# Patient Record
Sex: Female | Born: 1955 | Hispanic: Yes | Smoking: Former smoker
Health system: Southern US, Community
[De-identification: ages and names within clinical notes are randomized; demographics above are authoritative.]

## PROBLEM LIST (undated history)

## (undated) DIAGNOSIS — K8020 Calculus of gallbladder without cholecystitis without obstruction: Secondary | ICD-10-CM

## (undated) DIAGNOSIS — Z803 Family history of malignant neoplasm of breast: Secondary | ICD-10-CM

## (undated) DIAGNOSIS — N390 Urinary tract infection, site not specified: Secondary | ICD-10-CM

## (undated) DIAGNOSIS — M1990 Unspecified osteoarthritis, unspecified site: Secondary | ICD-10-CM

## (undated) HISTORY — DX: Urinary tract infection, site not specified: N39.0

## (undated) HISTORY — DX: Calculus of gallbladder without cholecystitis without obstruction: K80.20

## (undated) HISTORY — DX: Family history of malignant neoplasm of breast: Z80.3

---

## 2004-10-16 ENCOUNTER — Ambulatory Visit: Payer: Self-pay | Admitting: Unknown Physician Specialty

## 2005-09-04 ENCOUNTER — Ambulatory Visit: Payer: Self-pay | Admitting: Unknown Physician Specialty

## 2005-12-03 ENCOUNTER — Ambulatory Visit: Payer: Self-pay | Admitting: Unknown Physician Specialty

## 2006-12-09 ENCOUNTER — Ambulatory Visit: Payer: Self-pay | Admitting: Unknown Physician Specialty

## 2007-07-18 ENCOUNTER — Ambulatory Visit: Payer: Self-pay | Admitting: Specialist

## 2007-09-12 ENCOUNTER — Ambulatory Visit: Payer: Self-pay | Admitting: Unknown Physician Specialty

## 2007-09-19 ENCOUNTER — Ambulatory Visit: Payer: Self-pay | Admitting: Unknown Physician Specialty

## 2007-12-16 ENCOUNTER — Ambulatory Visit: Payer: Self-pay | Admitting: Unknown Physician Specialty

## 2008-03-16 ENCOUNTER — Ambulatory Visit: Payer: Self-pay | Admitting: Unknown Physician Specialty

## 2008-10-17 ENCOUNTER — Encounter: Payer: Self-pay | Admitting: General Practice

## 2008-11-16 ENCOUNTER — Encounter: Payer: Self-pay | Admitting: General Practice

## 2009-01-04 ENCOUNTER — Ambulatory Visit: Payer: Self-pay | Admitting: Unknown Physician Specialty

## 2009-03-08 ENCOUNTER — Ambulatory Visit: Payer: Self-pay | Admitting: Unknown Physician Specialty

## 2009-12-16 ENCOUNTER — Other Ambulatory Visit: Payer: Self-pay | Admitting: Unknown Physician Specialty

## 2009-12-18 ENCOUNTER — Encounter: Payer: Self-pay | Admitting: Unknown Physician Specialty

## 2010-01-02 ENCOUNTER — Ambulatory Visit: Payer: Self-pay | Admitting: Unknown Physician Specialty

## 2010-01-06 ENCOUNTER — Ambulatory Visit: Payer: Self-pay | Admitting: Unknown Physician Specialty

## 2010-01-16 ENCOUNTER — Encounter: Payer: Self-pay | Admitting: Unknown Physician Specialty

## 2010-01-20 ENCOUNTER — Ambulatory Visit: Payer: Self-pay | Admitting: Gastroenterology

## 2010-06-03 ENCOUNTER — Other Ambulatory Visit: Payer: Self-pay | Admitting: Physician Assistant

## 2010-10-06 ENCOUNTER — Emergency Department: Payer: Self-pay | Admitting: *Deleted

## 2011-01-13 ENCOUNTER — Ambulatory Visit: Payer: Self-pay | Admitting: Unknown Physician Specialty

## 2011-02-11 ENCOUNTER — Encounter: Payer: Self-pay | Admitting: Unknown Physician Specialty

## 2012-01-19 ENCOUNTER — Ambulatory Visit: Payer: Self-pay | Admitting: Obstetrics and Gynecology

## 2012-02-12 LAB — HM PAP SMEAR: HM Pap smear: NORMAL

## 2012-02-12 LAB — HM MAMMOGRAPHY: HM Mammogram: NORMAL

## 2012-04-06 ENCOUNTER — Ambulatory Visit: Payer: Self-pay | Admitting: Internal Medicine

## 2012-05-12 ENCOUNTER — Ambulatory Visit (INDEPENDENT_AMBULATORY_CARE_PROVIDER_SITE_OTHER): Payer: 59 | Admitting: Internal Medicine

## 2012-05-12 ENCOUNTER — Encounter: Payer: Self-pay | Admitting: Internal Medicine

## 2012-05-12 VITALS — BP 118/68 | HR 78 | Temp 98.6°F | Resp 15 | Ht 65.0 in | Wt 126.8 lb

## 2012-05-12 DIAGNOSIS — Z780 Asymptomatic menopausal state: Secondary | ICD-10-CM

## 2012-05-12 DIAGNOSIS — F5105 Insomnia due to other mental disorder: Secondary | ICD-10-CM

## 2012-05-12 DIAGNOSIS — F419 Anxiety disorder, unspecified: Secondary | ICD-10-CM

## 2012-05-12 DIAGNOSIS — F411 Generalized anxiety disorder: Secondary | ICD-10-CM

## 2012-05-12 DIAGNOSIS — K5900 Constipation, unspecified: Secondary | ICD-10-CM

## 2012-05-12 DIAGNOSIS — Z862 Personal history of diseases of the blood and blood-forming organs and certain disorders involving the immune mechanism: Secondary | ICD-10-CM

## 2012-05-12 DIAGNOSIS — Z1211 Encounter for screening for malignant neoplasm of colon: Secondary | ICD-10-CM

## 2012-05-12 DIAGNOSIS — Z8639 Personal history of other endocrine, nutritional and metabolic disease: Secondary | ICD-10-CM

## 2012-05-12 DIAGNOSIS — F489 Nonpsychotic mental disorder, unspecified: Secondary | ICD-10-CM

## 2012-05-12 MED ORDER — FLORA-Q 2 PO CAPS: 1.0000 | ORAL_CAPSULE | Freq: Two times a day (BID) | ORAL | Status: DC

## 2012-05-12 MED ORDER — CULTURELLE DIGESTIVE HEALTH PO CAPS: 1.0000 | ORAL_CAPSULE | Freq: Two times a day (BID) | ORAL | Status: DC

## 2012-05-12 NOTE — Progress Notes (Signed)
Patient ID: Heather Watson, female   DOB: 21-Feb-1955, 57 y.o.   MRN: [id number]  Patient Active Problem List  Diagnosis  . Menopause  . Unspecified constipation  . Insomnia secondary to anxiety    Subjective:  CC:   Chief Complaint  Patient presents with  . Establish Care    HPI: Cc: gas,  Constipation  despite use of chewable fiber, and increased intake of  water .  She also takes flaxseed daily. Her symptoms are daily. She has hemorrhoids occasionally bleed. Her last colonoscopy was 2 years ago and was reportedly normal. She has tried probiotics and use of lactase prior to meals containing dairy with no significant change.  No diarrhea. No unintentional weight loss.,    She is under considerable emotional stress due to    the recent diagnosis of her mother with invasive ductal carcinoma of the breast. Her mother lives in [place] and has been having neurologic symptoms whatsoever thus far  has not been able to get an MRI of the brain.   Marital discord with her husband of 30 years. For the past 5 years he has been less participatory in recreational activities with her. He prefers to do things on his own. She does  not think he is having an affair.,    Vaginal problems,  using a Pessary .  Trial of estrogen cream, abolene, developed yeast infection,  Sent to specialist at  [facility]  was given a complicated pessary and vaseline routine Dr Noelle Penner at [facility] .  Deferred SSRI for hot flashes  History of thyroid cyst  Last imaged and evaluated in 2012  With no progrssion noted. She has no history of prior biopsy.  tsh was normal last year.    History of gallstones, 3 prior attacks,  Ultrasound confirmed presence of nonobstructive gallstones. She has continually opted against surgery and using the presence of gallstones to enforce compliance with a low fat diet.  She's had no symptoms since she has changed her diet.    Heather Watson is a 56 y.o. female who presents as a new  patient to establish primary care with the chief complaint of   Past Medical History  Diagnosis Date  . UTI (urinary tract infection)   . Gallstones     History reviewed. No pertinent past surgical history.  Family History  Problem Relation Age of Onset  . Hypertension Mother   . Arthritis Mother     osteoarthritis  . Cancer Mother     breast  . Stroke Father   . Hypertension Father   . Cancer Sister     lymphoma  . Hypertension Sister   . Hypertension Brother   . Heart attack Maternal Grandfather   . Stroke Paternal Grandfather   . Hypertension Brother     History   Social History  . Marital Status: Married    Spouse Name: N/A    Number of Children: N/A  . Years of Education: N/A   Occupational History  . Not on file.   Social History Main Topics  . Smoking status: Former Smoker    Types: Cigarettes  . Smokeless tobacco: Not on file  . Alcohol Use: Yes  . Drug Use: No  . Sexually Active: Not on file   Other Topics Concern  . Not on file   Social History Narrative  . No narrative on file       @ALLHX @    Review of Systems:   The remainder of  the review of systems was negative except those addressed in the HPI.       Objective:  BP 118/68  Pulse 78  Temp(Src) 98.6 F (37 C)  Resp 15  Ht 5\' 5"  (1.651 m)  Wt 126 lb 12 oz (57.493 kg)  BMI 21.09 kg/m2  SpO2 98%  LMP 04/12/2006  General appearance: alert, cooperative and appears stated age Ears: normal TM's and external ear canals both ears Throat: lips, mucosa, and tongue normal; teeth and gums normal Neck: no adenopathy, no carotid bruit, supple, symmetrical, trachea midline and thyroid not enlarged, symmetric, no tenderness/mass/nodules Back: symmetric, no curvature. ROM normal. No CVA tenderness. Lungs: clear to auscultation bilaterally Heart: regular rate and rhythm, S1, S2 normal, no murmur, click, rub or gallop Abdomen: soft, non-tender; bowel sounds normal; no masses,  no  organomegaly Pulses: 2+ and symmetric Skin: Skin color, texture, turgor normal. No rashes or lesions Lymph nodes: Cervical, supraclavicular, and axillary nodes normal.  Assessment and Plan:  Menopause She was given a prescription for Lexapro and Effexor by her gynecologist but has not started either. We discussed the pros and cons of trying an SSRI for management of hot flashes. I've advised her to try for either one for several months given her concurrent emotional stress manifesting as anxiety.   Unspecified constipation The patient describes her problem was constipation but she actually moves her bowels daily. She's describing more abdominal pain and bloating which sounds to me like irritable bowel syndrome, with a very healthy diet of fiber and water and probiotics. Recommended trial of Clozaril is symptomatic, Gas-X for management of bloating and gas.  Insomnia secondary to anxiety She has been  taking melatonin for only a few weeks. Advised increase dose to 6 mg and take 3 hours before bedtime  H/O thyroid cyst Found incidentally during CT of the neck done for painful swallowing by prior PCP PCP in 2011. 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 Thyroid function has been reportedly normal.   Updated Medication List Outpatient Encounter Prescriptions as of 05/12/2012  Medication Sig Dispense Refill  . Bioflavonoid Products (ESTER C PO) 1000mg  daily      . Biotin 2500 MCG CAPS Take 1 capsule by mouth daily.      . Cholecalciferol (VITAMIN D) 2000 UNITS tablet Take 2,000 Units by mouth daily.      . COD LIVER OIL W/VIT A & D PO Take 1 capsule by mouth daily.      . COLLAGEN PO Pt takes 3000mg  daily      . Estradiol-Norethindrone Acet (ACTIVELLA) 0.5-0.1 MG per tablet Take 1 tablet by mouth daily.      . Lactobacillus-Inulin (CULTURELLE DIGESTIVE HEALTH) CAPS Take 1 capsule by mouth 2 (two) times daily.  60 capsule  0  . Melatonin 3 MG CAPS Take 1 capsule by mouth daily.      .  Methylsulfonylmethane (MSM) 1000 MG CAPS Take 1 capsule by mouth daily.      . Omega 3-6-9 Fatty Acids LIQD 2.5 teaspoons daily.      . Probiotic Product (FLORA-Q 2) CAPS Take 1 capsule by mouth 2 (two) times daily.  60 capsule  1  . Resveratrol 250 MG CAPS Take 1 capsule by mouth daily.      . vitamin B-12 (CYANOCOBALAMIN) 1000 MCG tablet Take 1,000 mcg by mouth daily.       No facility-administered encounter medications on file as of 05/12/2012.

## 2012-05-12 NOTE — Patient Instructions (Addendum)
Try Culturelle, followed by floraque (each for one month) to see if the bloating is resolved  Also try Gas X (simethicone) for the post prandial gas   Vaginismus. Com (website for vaginal stenosis treatment modalities)    Take the melatonin  6 mg  After dinner  Try a glass of wine with winner

## 2012-05-14 ENCOUNTER — Encounter: Payer: Self-pay | Admitting: Internal Medicine

## 2012-05-14 DIAGNOSIS — Z8639 Personal history of other endocrine, nutritional and metabolic disease: Secondary | ICD-10-CM | POA: Insufficient documentation

## 2012-05-14 DIAGNOSIS — F419 Anxiety disorder, unspecified: Secondary | ICD-10-CM | POA: Insufficient documentation

## 2012-05-14 DIAGNOSIS — K5900 Constipation, unspecified: Secondary | ICD-10-CM | POA: Insufficient documentation

## 2012-05-14 DIAGNOSIS — Z780 Asymptomatic menopausal state: Secondary | ICD-10-CM | POA: Insufficient documentation

## 2012-05-14 DIAGNOSIS — F5105 Insomnia due to other mental disorder: Secondary | ICD-10-CM | POA: Insufficient documentation

## 2012-05-14 NOTE — Assessment & Plan Note (Addendum)
The patient describes her problem was constipation but she actually moves her bowels daily. She's describing more abdominal pain and bloating which sounds to me like irritable bowel syndrome, with a very healthy diet of fiber and water and probiotics. Recommended trial of Clozaril is symptomatic, Gas-X for management of bloating and gas.

## 2012-05-14 NOTE — Assessment & Plan Note (Signed)
She was given a prescription for Lexapro and Effexor by her gynecologist but has not started either. We discussed the pros and cons of trying an SSRI for management of hot flashes. I've advised her to try for either one for several months given her concurrent emotional stress manifesting as anxiety.

## 2012-05-14 NOTE — Assessment & Plan Note (Signed)
Found incidentally during CT of the neck done for painful swallowing by prior PCP PCP in 2011. 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 Thyroid function has been reportedly normal.

## 2012-05-14 NOTE — Assessment & Plan Note (Signed)
She has been  taking melatonin for only a few weeks. Advised increase dose to 6 mg and take 3 hours before bedtime

## 2012-10-03 ENCOUNTER — Ambulatory Visit (INDEPENDENT_AMBULATORY_CARE_PROVIDER_SITE_OTHER): Payer: 59 | Admitting: Internal Medicine

## 2012-10-03 ENCOUNTER — Encounter: Payer: Self-pay | Admitting: Internal Medicine

## 2012-10-03 VITALS — BP 116/68 | HR 67 | Temp 98.5°F | Resp 14 | Wt 128.5 lb

## 2012-10-03 DIAGNOSIS — N390 Urinary tract infection, site not specified: Secondary | ICD-10-CM

## 2012-10-03 LAB — POCT URINALYSIS DIPSTICK
Bilirubin, UA: NEGATIVE
Glucose, UA: NEGATIVE
Ketones, UA: NEGATIVE
Nitrite, UA: NEGATIVE
Protein, UA: NEGATIVE
Spec Grav, UA: 1.015
Urobilinogen, UA: 0.2
pH, UA: 7

## 2012-10-03 MED ORDER — PHENAZOPYRIDINE HCL 200 MG PO TABS: 200.0000 mg | ORAL_TABLET | Freq: Three times a day (TID) | ORAL | Status: DC | PRN

## 2012-10-03 MED ORDER — CIPROFLOXACIN HCL 250 MG PO TABS: 250.0000 mg | ORAL_TABLET | Freq: Two times a day (BID) | ORAL | Status: DC

## 2012-10-03 MED ORDER — SULFAMETHOXAZOLE-TRIMETHOPRIM 800-160 MG PO TABS: 1.0000 | ORAL_TABLET | Freq: Two times a day (BID) | ORAL | Status: DC

## 2012-10-03 NOTE — Patient Instructions (Addendum)
Your urinalysis shows signs of infection.,  Please start the ciprofloxacin and take it for 5 days.  Keep the Septra rx as a back up.    We will contact you with the results of your culture , which may take 2 or 3 days .  It may require a change in antibiotic to septra DS

## 2012-10-03 NOTE — Progress Notes (Signed)
Patient ID: Heather Watson, female   DOB: [birth year], 57 y.o.   MRN: [id number]   Patient Active Problem List   Diagnosis Date Noted  . UTI (urinary tract infection) 10/04/2012  . Menopause 05/14/2012  . Unspecified constipation 05/14/2012  . 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 H/O thyroid cyst 05/14/2012    Subjective:  CC:   Chief Complaint  Patient presents with  . Acute Visit    chills in body and then gets a bad feeling in vagina     HPI:   Heather Watson a 57 y.o. female who presents Signs and symptoms consistent with prior UTI. 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 After wiping excessive oil off repeatedly she states that last Tuesday she developed vaginal pain and a cold feeling in her vagina which she has associated with urinary tract infections in the past. She took the  dose of Septra which she had on hand from a prior infection and her symptoms resolved. Symptoms recurred on Friday after having intercourse on Thursday so she took another dose.   she denies dysuria but this is never her symptom when she has a UTI. She is leaving for [place] in several days is concerned that she may have a partially treated infection.   Past Medical History  Diagnosis Date  . UTI (urinary tract infection)   . Gallstones     History reviewed. No pertinent past surgical history.   The following portions of the patient's history were reviewed and updated as appropriate: Allergies, current medications, and problem list.    Review of Systems:   12 Pt  review of systems was negative except those addressed in the HPI,     History   Social History  . Marital Status: Married    Spouse Name: N/A    Number of Children: N/A  . Years of Education: N/A   Occupational History  . Not on file.   Social History Main Topics  . Smoking status: Former Smoker   Types: Cigarettes  . Smokeless tobacco: Not on file  . Alcohol Use: Yes  . Drug Use: No  . Sexual Activity: Not on file   Other Topics Concern  . Not on file   Social History Narrative  . No narrative on file    Objective:  Filed Vitals:   10/03/12 1517  BP: 116/68  Pulse: 67  Temp: 98.5 F (36.9 C)  Resp: 14     General appearance: alert, cooperative and appears stated age Ears: normal TM's and external ear canals both ears Throat: lips, mucosa, and tongue normal; teeth and gums normal Neck: no adenopathy, no carotid bruit, supple, symmetrical, trachea midline and thyroid not enlarged, symmetric, no tenderness/mass/nodules Back: symmetric, no curvature. ROM normal. No CVA tenderness. Lungs: clear to auscultation bilaterally Heart: regular rate and rhythm, S1, S2 normal, no murmur, click, rub or gallop Abdomen: soft, non-tender; bowel sounds normal; no masses,  no organomegaly GYN: No urethral or vaginal irritation or discharge. Pulses: 2+ and symmetric Skin: Skin color, texture, turgor normal. No rashes or lesions Lymph nodes: Cervical, supraclavicular, and axillary nodes normal.  Assessment and Plan:  UTI (urinary tract infection) Her urinalysis does suggest that she has urinary tract infection. Since she has partially treated herself with Septra on 2 occasions we will avoid using this isn't. Therapy. I've given her prescription for ciprofloxacin to use for 5 days.  Have also given her a backup prescription for Septra given the chances that the culture will not be resulted until she is out of town.   Updated Medication List Outpatient Encounter Prescriptions as of 10/03/2012  Medication Sig Dispense Refill  . Bioflavonoid Products (ESTER C PO) 1000mg  daily      . Cholecalciferol (VITAMIN D) 2000 UNITS tablet Take 2,000 Units by mouth daily.      . COD LIVER OIL W/VIT A & D PO Take 1 capsule by mouth daily.      . Estradiol-Norethindrone Acet (ACTIVELLA) 0.5-0.1 MG  per tablet Take 1 tablet by mouth daily.      . Melatonin 3 MG CAPS Take 1 capsule by mouth daily.      . Omega 3-6-9 Fatty Acids LIQD 2.5 teaspoons daily.      Marland Kitchen Resveratrol 250 MG CAPS Take 1 capsule by mouth daily.      . ciprofloxacin (CIPRO) 250 MG tablet Take 1 tablet (250 mg total) by mouth 2 (two) times daily.  10 tablet  0  . phenazopyridine (PYRIDIUM) 200 MG tablet Take 1 tablet (200 mg total) by mouth 3 (three) times daily as needed for pain.  10 tablet  0  . sulfamethoxazole-trimethoprim (SEPTRA DS) 800-160 MG per tablet Take 1 tablet by mouth 2 (two) times daily.  14 tablet  0  . [DISCONTINUED] Biotin 2500 MCG CAPS Take 1 capsule by mouth daily.      . [DISCONTINUED] COLLAGEN PO Pt takes 3000mg  daily      . [DISCONTINUED] Lactobacillus-Inulin (CULTURELLE DIGESTIVE HEALTH) CAPS Take 1 capsule by mouth 2 (two) times daily.  60 capsule  0  . [DISCONTINUED] Methylsulfonylmethane (MSM) 1000 MG CAPS Take 1 capsule by mouth daily.      . [DISCONTINUED] Probiotic Product (FLORA-Q 2) CAPS Take 1 capsule by mouth 2 (two) times daily.  60 capsule  1  . [DISCONTINUED] vitamin B-12 (CYANOCOBALAMIN) 1000 MCG tablet Take 1,000 mcg by mouth daily.       No facility-administered encounter medications on file as of 10/03/2012.     Orders Placed This Encounter  Procedures  . Urine culture  . POCT urinalysis dipstick    No Follow-up on file.

## 2012-10-04 ENCOUNTER — Encounter: Payer: Self-pay | Admitting: Internal Medicine

## 2012-10-04 DIAGNOSIS — N390 Urinary tract infection, site not specified: Secondary | ICD-10-CM | POA: Insufficient documentation

## 2012-10-04 NOTE — Assessment & Plan Note (Signed)
Her urinalysis does suggest that she has urinary tract infection. Since she has partially treated herself with Septra on 2 occasions we will avoid using this isn't. Therapy. I've given her prescription for ciprofloxacin to use for 5 days. Have also given her a backup prescription for Septra given the chances that the culture will not be resulted until she is out of town.

## 2012-10-06 ENCOUNTER — Encounter: Payer: Self-pay | Admitting: Internal Medicine

## 2012-10-06 LAB — URINE CULTURE

## 2012-11-01 ENCOUNTER — Ambulatory Visit (INDEPENDENT_AMBULATORY_CARE_PROVIDER_SITE_OTHER): Payer: 59 | Admitting: Internal Medicine

## 2012-11-01 ENCOUNTER — Encounter: Payer: Self-pay | Admitting: Internal Medicine

## 2012-11-01 VITALS — BP 102/64 | HR 61 | Temp 98.7°F | Resp 14 | Wt 125.5 lb

## 2012-11-01 DIAGNOSIS — N390 Urinary tract infection, site not specified: Secondary | ICD-10-CM

## 2012-11-01 LAB — URINALYSIS, ROUTINE W REFLEX MICROSCOPIC
Bilirubin Urine: NEGATIVE
Nitrite: NEGATIVE
Total Protein, Urine: NEGATIVE
Urobilinogen, UA: 0.2 (ref 0.0–1.0)

## 2012-11-01 LAB — POCT URINALYSIS DIPSTICK
Glucose, UA: NEGATIVE
Ketones, UA: NEGATIVE
Protein, UA: NEGATIVE
Spec Grav, UA: 1.01
Urobilinogen, UA: 0.2

## 2012-11-01 MED ORDER — CIPROFLOXACIN HCL 250 MG PO TABS: 250.0000 mg | ORAL_TABLET | Freq: Two times a day (BID) | ORAL | Status: DC

## 2012-11-01 NOTE — Assessment & Plan Note (Signed)
Last UTI was E coli resistant to Septra. Sensitive to Cipro. Will repeat Cipro empirically for symptoms and abnormal UA.  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

## 2012-11-01 NOTE — Progress Notes (Signed)
Patient ID: Heather Watson, female   DOB: 1955/04/22, 57 y.o.   MRN: [id number]   Patient Active Problem List   Diagnosis Date Noted  . UTI (urinary tract infection) 10/04/2012  . Menopause 05/14/2012  . Unspecified constipation 05/14/2012  . 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 H/O thyroid cyst 05/14/2012    Subjective:  CC:   Chief Complaint  Patient presents with  . Follow-up  . Urinary Tract Infection    HPI:   Heather Watson a 57 y.o. female who presents Recurrent UTI . symptoms started today ,  7 am  With hesitancy,  A feeling of goosebumps/chills involving her bladder .  Has not had intercourse for 3 days,  Not using crisco oil for one week .  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    Past Medical History  Diagnosis Date  . UTI (urinary tract infection)   . Gallstones     No past surgical history on file.     The following portions of the patient's history were reviewed and updated as appropriate: Allergies, current medications, and problem list.    Review of Systems:   12 Pt  review of systems was negative except those addressed in the HPI,     History   Social History  . Marital Status: Married    Spouse Name: N/A    Number of Children: N/A  . Years of Education: N/A   Occupational History  . Not on file.   Social History Main Topics  . Smoking status: Former Smoker    Types: Cigarettes  . Smokeless tobacco: Not on file  . Alcohol Use: Yes  . Drug Use: No  . Sexual Activity: Not on file   Other Topics Concern  . Not on file   Social History Narrative  . No narrative on file    Objective:  Filed Vitals:   11/01/12 0859  BP: 102/64  Pulse: 61  Temp: 98.7 F (37.1 C)  Resp: 14     General appearance: alert, cooperative and appears stated age Ears: normal TM's and external  ear canals both ears Throat: lips, mucosa, and tongue normal; teeth and gums normal Neck: no adenopathy, no carotid bruit, supple, symmetrical, trachea midline and thyroid not enlarged, symmetric, no tenderness/mass/nodules Back: symmetric, no curvature. ROM normal. No CVA tenderness. Lungs: clear to auscultation bilaterally Heart: regular rate and rhythm, S1, S2 normal, no murmur, click, rub or gallop Abdomen: soft, non-tender; bowel sounds normal; no masses,  no organomegaly Pulses: 2+ and symmetric Skin: Skin color, texture, turgor normal. No rashes or lesions Lymph nodes: Cervical, supraclavicular, and axillary nodes normal.  Assessment and Plan:  Recurrent UTI Last UTI was E coli resistant to Septra. Sensitive to Cipro. Will repeat Cipro empirically for symptoms and abnormal UA.  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 Bioflavonoid Products (ESTER C PO) 1000mg  daily      . Cholecalciferol (VITAMIN D) 2000 UNITS tablet Take 2,000 Units by mouth daily.      . COD LIVER OIL W/VIT A & D PO Take 1 capsule by mouth daily.      . Estradiol-Norethindrone Acet (ACTIVELLA) 0.5-0.1 MG per tablet Take 1 tablet by mouth daily.      Marland Kitchen  Melatonin 3 MG CAPS Take 1 capsule by mouth daily.      . Omega 3-6-9 Fatty Acids LIQD 2.5 teaspoons daily.      . phenazopyridine (PYRIDIUM) 200 MG tablet Take 1 tablet (200 mg total) by mouth 3 (three) times daily as needed for pain.  10 tablet  0  . Resveratrol 250 MG CAPS Take 1 capsule by mouth daily.      . [DISCONTINUED] ciprofloxacin (CIPRO) 250 MG tablet Take 1 tablet (250 mg total) by mouth 2 (two) times daily.  10 tablet  0  . ciprofloxacin (CIPRO) 250 MG tablet Take 1 tablet (250 mg total) by mouth 2 (two) times daily.  14 tablet  0  . sulfamethoxazole-trimethoprim (SEPTRA DS) 800-160 MG per tablet Take  1 tablet by mouth 2 (two) times daily.  14 tablet  0   No facility-administered encounter medications on file as of 11/01/2012.

## 2012-11-04 ENCOUNTER — Encounter: Payer: Self-pay | Admitting: Internal Medicine

## 2012-11-04 LAB — URINE CULTURE: Colony Count: 45000

## 2012-11-08 ENCOUNTER — Encounter: Payer: Self-pay | Admitting: Internal Medicine

## 2012-11-28 ENCOUNTER — Encounter: Payer: Self-pay | Admitting: Internal Medicine

## 2012-11-28 ENCOUNTER — Ambulatory Visit (INDEPENDENT_AMBULATORY_CARE_PROVIDER_SITE_OTHER): Payer: 59 | Admitting: Internal Medicine

## 2012-11-28 VITALS — BP 116/68 | HR 70 | Temp 98.1°F | Resp 14 | Ht 65.0 in | Wt 128.2 lb

## 2012-11-28 DIAGNOSIS — B351 Tinea unguium: Secondary | ICD-10-CM

## 2012-11-28 DIAGNOSIS — N390 Urinary tract infection, site not specified: Secondary | ICD-10-CM

## 2012-11-28 NOTE — Patient Instructions (Signed)
You can treat your toenail fungus with daily application of tea tree oil .  Dab it on the nail surface after your have "roughed up" the surface of your toenail with a disposable emery board.  Levi Aland)  You can do this twice a day or alternate treatment with white vinegar   Try taking prenatal vitamins for your thinning nails

## 2012-11-29 DIAGNOSIS — B351 Tinea unguium: Secondary | ICD-10-CM | POA: Insufficient documentation

## 2012-11-29 NOTE — Progress Notes (Signed)
Patient ID: Heather Watson, female   DOB: 1956/02/09, 57 y.o.   MRN: [id number]  Patient Active Problem List   Diagnosis Date Noted  . Onychomycosis of toenail 11/29/2012  . Recurrent UTI 11/01/2012  . UTI (urinary tract infection) 10/04/2012  . Menopause 05/14/2012  . Unspecified constipation 05/14/2012  . 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 H/O thyroid cyst 05/14/2012    Subjective:  CC:   Chief Complaint  Patient presents with  . Follow-up    toe nail fungus    HPI:   Heather Watson a 57 y.o. female who presents with Toenail fungus.  Patient was recently embarrassed by a nail pedicurist at a local salon when she called attention to her infectio nrecently.  Infection only present in great toenails.  About 1/3 on nail is affected. No prior treatment   Past Medical History  Diagnosis Date  . UTI (urinary tract infection)   . Gallstones     No past surgical history on file.     The following portions of the patient's history were reviewed and updated as appropriate: Allergies, current medications, and problem list.    Review of Systems:   Patient denies headache, fevers, malaise, unintentional weight loss, skin rash, eye pain, sinus congestion and sinus pain, sore throat, dysphagia,  hemoptysis , cough, dyspnea, wheezing, chest pain, palpitations, orthopnea, edema, abdominal pain, nausea, melena, diarrhea, constipation, flank pain, dysuria, hematuria, urinary  Frequency, nocturia, numbness, tingling, seizures,  Focal weakness, Loss of consciousness,  Tremor, insomnia, depression, anxiety, and suicidal ideation.     History   Social History  . Marital Status: Married    Spouse Name: N/A    Number of Children: N/A  . Years of Education: N/A   Occupational History  . Not on file.   Social History Main Topics  . Smoking status: Former Smoker    Types: Cigarettes  . Smokeless tobacco: Not on file  . Alcohol Use: Yes  . Drug Use: No  . Sexual  Activity: Not on file   Other Topics Concern  . Not on file   Social History Narrative  . No narrative on file    Objective:  Filed Vitals:   11/28/12 1429  BP: 116/68  Pulse: 70  Temp: 98.1 F (36.7 C)  Resp: 14     General appearance: alert, cooperative and appears stated age Neck: no adenopathy, no carotid bruit, supple, symmetrical, trachea midline and thyroid not enlarged, symmetric, no tenderness/mass/nodules Back: symmetric, no curvature. ROM normal. No CVA tenderness. Lungs: clear to auscultation bilaterally Heart: regular rate and rhythm, S1, S2 normal, no murmur, click, rub or gallop Abdomen: soft, non-tender; bowel sounds normal; no masses,  no organomegaly Pulses: 2+ and symmetric Skin: Skin color, texture, turgor normal. No rashes or lesions. Bilateral great toenail changes consistent with fungus Lymph nodes: Cervical, supraclavicular, and axillary nodes normal.  Assessment and Plan:  Onychomycosis of toenail Nonpharmacologic treatment preferred.  Treatment outlined   Recurrent UTI Awaiting urology evaluation to rule out strictures and ureteral reflux   Updated Medication List Outpatient Encounter Prescriptions as of 11/28/2012  Medication Sig Dispense Refill  . Bioflavonoid Products (ESTER C PO) 1000mg  daily      . Cholecalciferol (VITAMIN D) 2000 UNITS tablet Take 2,000 Units by mouth daily.      . COD LIVER OIL W/VIT A & D PO Take 1 capsule by mouth daily.      . Estradiol-Norethindrone Acet (ACTIVELLA) 0.5-0.1 MG per tablet Take 1 tablet by  mouth daily.      . Melatonin 3 MG CAPS Take 1 capsule by mouth daily.      . Omega 3-6-9 Fatty Acids LIQD 2.5 teaspoons daily.      Marland Kitchen Resveratrol 250 MG CAPS Take 1 capsule by mouth daily.      . [DISCONTINUED] ciprofloxacin (CIPRO) 250 MG tablet Take 1 tablet (250 mg total) by mouth 2 (two) times daily.  14 tablet  0  . [DISCONTINUED] phenazopyridine (PYRIDIUM) 200 MG tablet Take 1 tablet (200 mg total) by  mouth 3 (three) times daily as needed for pain.  10 tablet  0  . [DISCONTINUED] sulfamethoxazole-trimethoprim (SEPTRA DS) 800-160 MG per tablet Take 1 tablet by mouth 2 (two) times daily.  14 tablet  0   No facility-administered encounter medications on file as of 11/28/2012.     No orders of the defined types were placed in this encounter.    No Follow-up on file.

## 2012-11-29 NOTE — Assessment & Plan Note (Signed)
Awaiting urology evaluation to rule out strictures and ureteral reflux

## 2012-11-29 NOTE — Assessment & Plan Note (Signed)
Nonpharmacologic treatment preferred.  Treatment outlined

## 2012-12-22 ENCOUNTER — Encounter: Payer: Self-pay | Admitting: Adult Health

## 2012-12-22 ENCOUNTER — Ambulatory Visit (INDEPENDENT_AMBULATORY_CARE_PROVIDER_SITE_OTHER): Payer: 59 | Admitting: Adult Health

## 2012-12-22 VITALS — BP 106/60 | HR 67 | Temp 98.1°F | Resp 12 | Wt 128.0 lb

## 2012-12-22 DIAGNOSIS — IMO0002 Reserved for concepts with insufficient information to code with codable children: Secondary | ICD-10-CM

## 2012-12-22 DIAGNOSIS — S30814A Abrasion of vagina and vulva, initial encounter: Secondary | ICD-10-CM | POA: Insufficient documentation

## 2012-12-22 DIAGNOSIS — R300 Dysuria: Secondary | ICD-10-CM

## 2012-12-22 DIAGNOSIS — R6883 Chills (without fever): Secondary | ICD-10-CM

## 2012-12-22 LAB — POCT URINALYSIS DIPSTICK
Bilirubin, UA: NEGATIVE
Ketones, UA: NEGATIVE
Protein, UA: NEGATIVE
Spec Grav, UA: <=1.005
pH, UA: 6.5

## 2012-12-22 MED ORDER — CIPROFLOXACIN HCL 250 MG PO TABS: 250.0000 mg | ORAL_TABLET | Freq: Two times a day (BID) | ORAL | Status: DC

## 2012-12-22 NOTE — Progress Notes (Signed)
  Subjective:    Patient ID: Heather Watson, female    DOB: [birth year], 57 y.o.   MRN: [id number]  HPI  Patient is a pleasant 57 year old female who presents to clinic with the following concerns:  1. Dysuria - this has been ongoing for 2-3 days. She has not taken any over-the-counter medications. She denies hematuria or fever.  2. irritation on the opening of the vagina. Patient reports dyspareunia that has been going on since menopause. She has been evaluated by a specialist for dyspareunia. She reports feeling pain during intercourse on Sunday later noticing an irritation on the opening of the vagina. She would like this evaluated. She has also noted a foul odor from her vagina and a white discharge.   Current Outpatient Prescriptions on File Prior to Visit  Medication Sig Dispense Refill  . Bioflavonoid Products (ESTER C PO) 1000mg  daily      . Cholecalciferol (VITAMIN D) 2000 UNITS tablet Take 2,000 Units by mouth daily.      . COD LIVER OIL W/VIT A & D PO Take 1 capsule by mouth daily.      . Estradiol-Norethindrone Acet (ACTIVELLA) 0.5-0.1 MG per tablet Take 1 tablet by mouth daily.      . Omega 3-6-9 Fatty Acids LIQD 2.5 teaspoons daily.      Marland Kitchen Resveratrol 250 MG CAPS Take 1 capsule by mouth daily.       No current facility-administered medications on file prior to visit.    Review of Systems  Constitutional: Positive for chills. Negative for fever.  Respiratory: Negative.   Cardiovascular: Negative.   Gastrointestinal: Negative.   Genitourinary: Positive for dysuria, urgency, frequency and vaginal discharge. Negative for hematuria, flank pain and pelvic pain.  Neurological: Negative.   Psychiatric/Behavioral: Negative.   All other systems reviewed and are negative.       Objective:   Physical Exam  Constitutional: She appears well-developed and well-nourished. No distress.  Cardiovascular: Normal rate and regular rhythm.   Pulmonary/Chest: Effort normal. No  respiratory distress.  Abdominal: Soft.  Genitourinary:    There is no rash, tenderness, lesion or injury on the right labia. There is no rash, tenderness, lesion or injury on the left labia. There is erythema and tenderness around the vagina. No bleeding around the vagina. No foreign body around the vagina. There are signs of injury around the vagina. Vaginal discharge found.            Assessment & Plan:

## 2012-12-22 NOTE — Addendum Note (Signed)
Addended by: Montine Circle D on: 12/22/2012 04:24 PM   Modules accepted: Orders

## 2012-12-22 NOTE — Progress Notes (Signed)
Pre-visit discussion using our clinic review tool. No additional management support is needed unless otherwise documented below in the visit note.  

## 2012-12-22 NOTE — Assessment & Plan Note (Signed)
Abrasion noted on the left lower opening of the vagina. No signs of infection. The cervix appears normal. There is some drainage. A wet prep obtained. The remaining vaginal wall was normal. Instructed patient to avoid sexual intercourse until area has healed. May use petroleum jelly. RTC if no improvement within one week.

## 2012-12-22 NOTE — Assessment & Plan Note (Signed)
UA dipstick shows positive nitrites, trace leukocytes and moderate amount of blood. Send urine for culture. Start Cipro

## 2012-12-23 ENCOUNTER — Encounter: Payer: Self-pay | Admitting: Adult Health

## 2012-12-23 ENCOUNTER — Other Ambulatory Visit: Payer: Self-pay | Admitting: Adult Health

## 2012-12-23 LAB — WET PREP BY MOLECULAR PROBE
Candida species: NEGATIVE
Gardnerella vaginalis: POSITIVE — AB
Trichomonas vaginosis: NEGATIVE

## 2012-12-23 MED ORDER — METRONIDAZOLE 500 MG PO TABS: 500.0000 mg | ORAL_TABLET | Freq: Two times a day (BID) | ORAL | Status: DC

## 2012-12-24 LAB — URINE CULTURE

## 2013-01-06 ENCOUNTER — Other Ambulatory Visit (HOSPITAL_COMMUNITY)
Admission: RE | Admit: 2013-01-06 | Discharge: 2013-01-06 | Disposition: A | Discharge status: Home / Self Care | Payer: 59 | Source: Ambulatory Visit | Attending: Internal Medicine | Admitting: Internal Medicine

## 2013-01-06 ENCOUNTER — Encounter: Payer: Self-pay | Admitting: Internal Medicine

## 2013-01-06 ENCOUNTER — Ambulatory Visit (INDEPENDENT_AMBULATORY_CARE_PROVIDER_SITE_OTHER): Payer: 59 | Admitting: Internal Medicine

## 2013-01-06 VITALS — BP 112/68 | HR 71 | Temp 98.0°F | Resp 12 | Ht 64.75 in | Wt 128.5 lb

## 2013-01-06 DIAGNOSIS — E559 Vitamin D deficiency, unspecified: Secondary | ICD-10-CM

## 2013-01-06 DIAGNOSIS — Z1239 Encounter for other screening for malignant neoplasm of breast: Secondary | ICD-10-CM

## 2013-01-06 DIAGNOSIS — B9689 Other specified bacterial agents as the cause of diseases classified elsewhere: Secondary | ICD-10-CM

## 2013-01-06 DIAGNOSIS — A499 Bacterial infection, unspecified: Secondary | ICD-10-CM

## 2013-01-06 DIAGNOSIS — N760 Acute vaginitis: Secondary | ICD-10-CM

## 2013-01-06 DIAGNOSIS — Z1151 Encounter for screening for human papillomavirus (HPV): Secondary | ICD-10-CM | POA: Insufficient documentation

## 2013-01-06 DIAGNOSIS — Z13820 Encounter for screening for osteoporosis: Secondary | ICD-10-CM

## 2013-01-06 DIAGNOSIS — Z0000 Encounter for general adult medical examination without abnormal findings: Secondary | ICD-10-CM

## 2013-01-06 DIAGNOSIS — Z124 Encounter for screening for malignant neoplasm of cervix: Secondary | ICD-10-CM

## 2013-01-06 DIAGNOSIS — N952 Postmenopausal atrophic vaginitis: Secondary | ICD-10-CM

## 2013-01-06 DIAGNOSIS — R5381 Other malaise: Secondary | ICD-10-CM

## 2013-01-06 DIAGNOSIS — Z01419 Encounter for gynecological examination (general) (routine) without abnormal findings: Secondary | ICD-10-CM | POA: Insufficient documentation

## 2013-01-06 DIAGNOSIS — E785 Hyperlipidemia, unspecified: Secondary | ICD-10-CM

## 2013-01-06 MED ORDER — PROGESTERONE MICRONIZED 200 MG PO CAPS: 200.0000 mg | ORAL_CAPSULE | Freq: Every day | ORAL | Status: DC

## 2013-01-06 MED ORDER — ESTRADIOL 2 MG PO TABS: 2.0000 mg | ORAL_TABLET | Freq: Every day | ORAL | Status: DC

## 2013-01-06 MED ORDER — ESTROGENS, CONJUGATED 0.625 MG/GM VA CREA: 1.0000 | TOPICAL_CREAM | Freq: Every day | VAGINAL | Status: DC

## 2013-01-06 NOTE — Progress Notes (Signed)
Pre-visit discussion using our clinic review tool. No additional management support is needed unless otherwise documented below in the visit note.  

## 2013-01-06 NOTE — Progress Notes (Signed)
Patient ID: Heather Watson, female   DOB: 05-01-1955, 57 y.o.   MRN: [id number]     Subjective:     Ian Cavey is a 57 y.o. female and is here for a comprehensive physical exam. The patient reports problems - with vaginal discharge and dyspareunia.  History   Social History  . Marital Status: Married    Spouse Name: N/A    Number of Children: N/A  . Years of Education: N/A   Occupational History  . Not on file.   Social History Main Topics  . Smoking status: Former Smoker    Types: Cigarettes  . Smokeless tobacco: Not on file  . Alcohol Use: Yes  . Drug Use: No  . Sexual Activity: Not on file   Other Topics Concern  . Not on file   Social History Narrative  . No narrative on file   Health Maintenance  Topic Date Due  . Influenza Vaccine  09/16/2013  . Mammogram  02/11/2014  . Tetanus/tdap  06/04/2015  . Pap Smear  01/07/2016  . Colonoscopy  02/14/2020    The following portions of the patient's history were reviewed and updated as appropriate: allergies, current medications, past family history, past medical history, past social history, past surgical history and problem list.  Review of Systems A comprehensive review of systems was negative.   Objective:   General Appearance:    Alert, cooperative, no distress, appears stated age  Head:    Normocephalic, without obvious abnormality, atraumatic  Eyes:    PERRL, conjunctiva/corneas clear, EOM's intact, fundi    benign, both eyes  Ears:    Normal TM's and external ear canals, both ears  Nose:   Nares normal, septum midline, mucosa normal, no drainage    or sinus tenderness  Throat:   Lips, mucosa, and tongue normal; teeth and gums normal  Neck:   Supple, symmetrical, trachea midline, no adenopathy;    thyroid:  no enlargement/tenderness/nodules; no carotid   bruit or JVD  Back:     Symmetric, no curvature, ROM normal, no CVA tenderness  Lungs:     Clear to auscultation bilaterally, respirations  unlabored  Chest Wall:    No tenderness or deformity   Heart:    Regular rate and rhythm, S1 and S2 normal, no murmur, rub   or gallop  Breast Exam:    No tenderness, masses, or nipple abnormality  Abdomen:     Soft, non-tender, bowel sounds active all four quadrants,    no masses, no organomegaly  Genitalia:    Pelvic: cervix normal in appearance, external genitalia normal, no adnexal masses or tenderness, no cervical motion tenderness, rectovaginal septum normal, uterus normal size, shape, and consistency and vagina normal  Increased discharge  Extremities:   Extremities normal, atraumatic, no cyanosis or edema  Pulses:   2+ and symmetric all extremities  Skin:   Skin color, texture, turgor normal, no rashes or lesions  Lymph nodes:   Cervical, supraclavicular, and axillary nodes normal  Neurologic:   CNII-XII intact, normal strength, sensation and reflexes    throughout     Assessment and Plan:  Postmenopausal atrophic vaginitis patient had many questions about treatment including Cialis which was prescribed by her gynecologist but not initiated yet.  Trial of premarin cream once vaginitisi has been ruled out.    Bacterial vaginosis Repeat culture sent of increased secretions noted on exam.   Routine general medical examination at a health care facility Annual comprehensive exam was done including  breast, pelvic and PAP smear. All screenings have been addressed .    Updated Medication List Outpatient Encounter Prescriptions as of 01/06/2013  Medication Sig  . Bioflavonoid Products (ESTER C PO) 1000mg  daily  . Cholecalciferol (VITAMIN D) 2000 UNITS tablet Take 2,000 Units by mouth daily.  . COD LIVER OIL W/VIT A & D PO Take 1 capsule by mouth daily.  . Omega 3-6-9 Fatty Acids LIQD 2.5 teaspoons daily.  . Probiotic Product (PROBIOTIC DAILY PO) Take 1 capsule by mouth daily.  Marland Kitchen Resveratrol 250 MG CAPS Take 1 capsule by mouth daily.  . [DISCONTINUED] Estradiol-Norethindrone Acet  (ACTIVELLA) 0.5-0.1 MG per tablet Take 1 tablet by mouth daily.  Marland Kitchen conjugated estrogens (PREMARIN) vaginal cream Place 1 Applicatorful vaginally daily. 1 gram  At night twice weekly  . estradiol (ESTRACE) 2 MG tablet Take 1 tablet (2 mg total) by mouth daily.  . progesterone (PROMETRIUM) 200 MG capsule Take 1 capsule (200 mg total) by mouth daily. For 12 days per month  . [DISCONTINUED] ciprofloxacin (CIPRO) 250 MG tablet Take 1 tablet (250 mg total) by mouth 2 (two) times daily.  . [DISCONTINUED] metroNIDAZOLE (FLAGYL) 500 MG tablet Take 1 tablet (500 mg total) by mouth 2 (two) times daily.

## 2013-01-06 NOTE — Patient Instructions (Addendum)
For hot flashes:  Estradiol 2 mg daily Progesterone 200 mg  Daily for 12 days per month  For vaginal dryness:  WAIT UNTIL any signs of infection have been resolved Use the premarin cream nightly for 2 weeks  1  Gram.  Then twice weekly thereafter   Astroglide trial recommended once vaginosis has been ruled out   Bone Density test will be set up and mammogram changed to 3d  Make appt for fasting labs

## 2013-01-07 LAB — WET PREP BY MOLECULAR PROBE
Candida species: NEGATIVE
Gardnerella vaginalis: NEGATIVE
Trichomonas vaginosis: NEGATIVE

## 2013-01-08 ENCOUNTER — Encounter: Payer: Self-pay | Admitting: Internal Medicine

## 2013-01-08 DIAGNOSIS — N952 Postmenopausal atrophic vaginitis: Secondary | ICD-10-CM | POA: Insufficient documentation

## 2013-01-08 DIAGNOSIS — Z0000 Encounter for general adult medical examination without abnormal findings: Secondary | ICD-10-CM | POA: Insufficient documentation

## 2013-01-08 DIAGNOSIS — B9689 Other specified bacterial agents as the cause of diseases classified elsewhere: Secondary | ICD-10-CM | POA: Insufficient documentation

## 2013-01-08 NOTE — Assessment & Plan Note (Signed)
Annual comprehensive exam was done including breast, pelvic and PAP smear. All screenings have been addressed .  

## 2013-01-08 NOTE — Assessment & Plan Note (Signed)
Repeat culture sent of increased secretions noted on exam.

## 2013-01-08 NOTE — Assessment & Plan Note (Signed)
patient had many questions about treatment including Cialis which was prescribed by her gynecologist but not initiated yet.  Trial of premarin cream once vaginitisi has been ruled out.

## 2013-01-11 ENCOUNTER — Other Ambulatory Visit: Payer: 59

## 2013-01-13 ENCOUNTER — Encounter: Payer: Self-pay | Admitting: Internal Medicine

## 2013-01-13 MED ORDER — FLUCONAZOLE 150 MG PO TABS: 150.0000 mg | ORAL_TABLET | Freq: Every day | ORAL | Status: DC

## 2013-01-13 NOTE — Addendum Note (Signed)
Addended by: Sherlene Shams on: 01/13/2013 07:35 AM   Modules accepted: Orders

## 2013-01-17 ENCOUNTER — Encounter: Payer: Self-pay | Admitting: Emergency Medicine

## 2013-01-18 ENCOUNTER — Telehealth: Payer: Self-pay | Admitting: *Deleted

## 2013-01-18 NOTE — Telephone Encounter (Signed)
Patient called upset that she had to reschedule labs and to fast until 10.45 tried to explain to patient no other appointments available for lab . Suggested patient come at 7.50 am to be worked in earlier, discussed with Zella Ball patient had been told by front desk she could do this.

## 2013-01-19 ENCOUNTER — Other Ambulatory Visit (INDEPENDENT_AMBULATORY_CARE_PROVIDER_SITE_OTHER): Payer: 59

## 2013-01-19 DIAGNOSIS — E559 Vitamin D deficiency, unspecified: Secondary | ICD-10-CM

## 2013-01-19 DIAGNOSIS — R5381 Other malaise: Secondary | ICD-10-CM

## 2013-01-19 DIAGNOSIS — E785 Hyperlipidemia, unspecified: Secondary | ICD-10-CM

## 2013-01-19 LAB — LIPID PANEL
Cholesterol: 148 mg/dL (ref 0–200)
HDL: 65.5 mg/dL (ref >39.00)
LDL Cholesterol: 77 mg/dL (ref 0–99)
Total CHOL/HDL Ratio: 2
Triglycerides: 27 mg/dL (ref 0.0–149.0)
VLDL: 5.4 mg/dL (ref 0.0–40.0)

## 2013-01-19 LAB — CBC WITH DIFFERENTIAL/PLATELET
Eosinophils Relative: 2.5 % (ref 0.0–5.0)
HCT: 36.2 % (ref 36.0–46.0)
Hemoglobin: 12.6 g/dL (ref 12.0–15.0)
Lymphs Abs: 1.4 10*3/uL (ref 0.7–4.0)
MCV: 89.6 fl (ref 78.0–100.0)
Monocytes Absolute: 0.4 10*3/uL (ref 0.1–1.0)
Monocytes Relative: 7.7 % (ref 3.0–12.0)
Neutro Abs: 2.7 10*3/uL (ref 1.4–7.7)
Neutrophils Relative %: 58.2 % (ref 43.0–77.0)
Platelets: 243 10*3/uL (ref 150.0–400.0)
RBC: 4.04 Mil/uL (ref 3.87–5.11)
WBC: 4.7 10*3/uL (ref 4.5–10.5)

## 2013-01-19 LAB — TSH: TSH: 1.34 u[IU]/mL (ref 0.35–5.50)

## 2013-01-19 LAB — COMPREHENSIVE METABOLIC PANEL
Alkaline Phosphatase: 48 U/L (ref 39–117)
BUN: 15 mg/dL (ref 6–23)
CO2: 28 mEq/L (ref 19–32)
Creatinine, Ser: 0.6 mg/dL (ref 0.4–1.2)
GFR: 105.44 mL/min (ref >60.00)
Glucose, Bld: 91 mg/dL (ref 70–99)
Potassium: 4.3 mEq/L (ref 3.5–5.1)
Sodium: 140 mEq/L (ref 135–145)
Total Bilirubin: 0.6 mg/dL (ref 0.3–1.2)
Total Protein: 6.7 g/dL (ref 6.0–8.3)

## 2013-01-20 ENCOUNTER — Ambulatory Visit: Payer: Self-pay | Admitting: Internal Medicine

## 2013-01-20 LAB — VITAMIN D 25 HYDROXY (VIT D DEFICIENCY, FRACTURES): Vit D, 25-Hydroxy: 41 ng/mL (ref 30–89)

## 2013-01-22 ENCOUNTER — Encounter: Payer: Self-pay | Admitting: Internal Medicine

## 2013-01-24 ENCOUNTER — Ambulatory Visit: Payer: Self-pay | Admitting: Internal Medicine

## 2013-01-26 ENCOUNTER — Encounter: Payer: Self-pay | Admitting: Internal Medicine

## 2013-01-26 ENCOUNTER — Telehealth: Payer: Self-pay | Admitting: Internal Medicine

## 2013-02-13 ENCOUNTER — Encounter: Payer: Self-pay | Admitting: Internal Medicine

## 2013-02-27 ENCOUNTER — Encounter: Payer: Self-pay | Admitting: Internal Medicine

## 2013-02-28 ENCOUNTER — Encounter: Payer: Self-pay | Admitting: Internal Medicine

## 2013-06-02 ENCOUNTER — Ambulatory Visit: Payer: Self-pay | Admitting: Orthopedic Surgery

## 2013-07-04 ENCOUNTER — Encounter: Payer: Self-pay | Admitting: Internal Medicine

## 2013-11-29 ENCOUNTER — Encounter: Payer: Self-pay | Admitting: Internal Medicine

## 2013-12-26 LAB — HEPATIC FUNCTION PANEL: ALT: 25 U/L (ref 7–35)

## 2013-12-26 LAB — HEMOGLOBIN A1C: Hgb A1c MFr Bld: 5.3 % (ref 4.0–6.0)

## 2013-12-26 LAB — BASIC METABOLIC PANEL
BUN: 19 mg/dL (ref 4–21)
Creatinine: 0.6 mg/dL (ref 0.5–1.1)
Potassium: 3.8 mmol/L (ref 3.4–5.3)

## 2013-12-26 LAB — LIPID PANEL
CHOLESTEROL: 142 mg/dL (ref 0–200)
HDL: 68 mg/dL (ref 35–70)
LDL CALC: 64 mg/dL
TRIGLYCERIDES: 48 mg/dL (ref 40–160)

## 2013-12-26 LAB — TSH: TSH: 2.15 u[IU]/mL (ref 0.41–5.90)

## 2014-01-30 ENCOUNTER — Ambulatory Visit: Payer: Self-pay | Admitting: Internal Medicine

## 2014-02-01 ENCOUNTER — Ambulatory Visit: Payer: Self-pay | Admitting: Internal Medicine

## 2014-02-02 ENCOUNTER — Encounter: Payer: Self-pay | Admitting: Internal Medicine

## 2014-02-02 ENCOUNTER — Ambulatory Visit (INDEPENDENT_AMBULATORY_CARE_PROVIDER_SITE_OTHER): Payer: 59 | Admitting: Internal Medicine

## 2014-02-02 VITALS — BP 102/64 | HR 73 | Temp 98.8°F | Resp 14 | Ht 64.5 in | Wt 130.2 lb

## 2014-02-02 DIAGNOSIS — Z0000 Encounter for general adult medical examination without abnormal findings: Secondary | ICD-10-CM

## 2014-02-02 DIAGNOSIS — Z87828 Personal history of other (healed) physical injury and trauma: Secondary | ICD-10-CM

## 2014-02-02 DIAGNOSIS — Z8739 Personal history of other diseases of the musculoskeletal system and connective tissue: Secondary | ICD-10-CM

## 2014-02-02 MED ORDER — ALPRAZOLAM 0.25 MG PO TABS: 0.2500 mg | ORAL_TABLET | Freq: Every evening | ORAL | Status: DC | PRN

## 2014-02-02 MED ORDER — MELOXICAM 7.5 MG PO TABS: 7.5000 mg | ORAL_TABLET | Freq: Every day | ORAL | Status: DC

## 2014-02-02 NOTE — Progress Notes (Signed)
Patient ID: Heather Watson, female   DOB: July 31, 1955, 58 y.o.   MRN: [id number]   Subjective:     Heather Watson is a 58 y.o. female and is here for a comprehensive physical exam. The patient reports no problems.  History   Social History  . Marital Status: Married    Spouse Name: N/A    Number of Children: N/A  . Years of Education: N/A   Occupational History  . Not on file.   Social History Main Topics  . Smoking status: Former Smoker    Types: Cigarettes  . Smokeless tobacco: Not on file  . Alcohol Use: Yes  . Drug Use: No  . Sexual Activity: Not on file   Other Topics Concern  . Not on file   Social History Narrative   Health Maintenance  Topic Date Due  . INFLUENZA VACCINE  09/17/2014  . MAMMOGRAM  01/21/2015  . TETANUS/TDAP  06/04/2015  . PAP SMEAR  01/07/2016  . COLONOSCOPY  02/14/2020    The following portions of the patient's history were reviewed and updated as appropriate: allergies, current medications, past family history, past medical history, past social history, past surgical history and problem list.  Review of Systems A comprehensive review of systems was negative.   Objective:  BP 102/64 mmHg  Pulse 73  Temp(Src) 98.8 F (37.1 C) (Oral)  Resp 14  Ht 5' 4.5" (1.638 m)  Wt 130 lb 4 oz (59.081 kg)  BMI 22.02 kg/m2  SpO2 98%  LMP 04/12/2006  General appearance: alert, cooperative and appears stated age Head: Normocephalic, without obvious abnormality, atraumatic Eyes: conjunctivae/corneas clear. PERRL, EOM's intact. Fundi benign. Ears: normal TM's and external ear canals both ears Nose: Nares normal. Septum midline. Mucosa normal. No drainage or sinus tenderness. Throat: lips, mucosa, and tongue normal; teeth and gums normal Neck: no adenopathy, no carotid bruit, no JVD, supple, symmetrical, trachea midline and thyroid not enlarged, symmetric, no tenderness/mass/nodules Lungs: clear to auscultation bilaterally Breasts: normal  appearance, no masses or tenderness Heart: regular rate and rhythm, S1, S2 normal, no murmur, click, rub or gallop Abdomen: soft, non-tender; bowel sounds normal; no masses,  no organomegaly Extremities: extremities normal, atraumatic, no cyanosis or edema Pulses: 2+ and symmetric Skin: Skin color, texture, turgor normal. No rashes or lesions Neurologic: Alert and oriented X 3, normal strength and tone. Normal symmetric reflexes. Normal coordination and gait.    Assessment and Plan:   Problem List Items Addressed This Visit      Other   Encounter for preventive health examination    Annual wellness  exam was done as well as a comprehensive physical exam and management of acute and chronic conditions .  During the course of the visit the patient was educated and counseled about appropriate screening and preventive services including :  diabetes screening, lipid analysis with projected  10 year  risk for CAD , nutrition counseling, colorectal cancer screening, and recommended immunizations.  Printed recommendations for health maintenance screenings was given.     History of meniscal tear - Primary

## 2014-02-02 NOTE — Progress Notes (Signed)
Pre visit review using our clinic review tool, if applicable. No additional management support is needed unless otherwise documented below in the visit note. 

## 2014-02-02 NOTE — Patient Instructions (Signed)
You can reach your goal of 25 to 35 g fiber daily using the whole wheat tortillas by [place] (availabel at [place])  Health Maintenance Adopting a healthy lifestyle and getting preventive care can go a long way to promote health and wellness. Talk with your health care provider about what schedule of regular examinations is right for you. This is a good chance for you to check in with your provider about disease prevention and staying healthy. In between checkups, there are plenty of things you can do on your own. Experts have done a lot of research about which lifestyle changes and preventive measures are most likely to keep you healthy. Ask your health care provider for more information. WEIGHT AND DIET  Eat a healthy diet  Be sure to include plenty of vegetables, fruits, low-fat dairy products, and lean protein.  Do not eat a lot of foods high in solid fats, added sugars, or salt.  Get regular exercise. This is one of the most important things you can do for your health.  Most adults should exercise for at least 150 minutes each week. The exercise should increase your heart rate and make you sweat (moderate-intensity exercise).  Most adults should also do strengthening exercises at least twice a week. This is in addition to the moderate-intensity exercise.  Maintain a healthy weight  Body mass index (BMI) is a measurement that can be used to identify possible weight problems. It estimates body fat based on height and weight. Your health care provider can help determine your BMI and help you achieve or maintain a healthy weight.  For females 60 years of age and older:   A BMI below 18.5 is considered underweight.  A BMI of 18.5 to 24.9 is normal.  A BMI of 25 to 29.9 is considered overweight.  A BMI of 30 and above is considered obese.  Watch levels of cholesterol and blood lipids  You should start having your blood tested for lipids and cholesterol at 57 years of age,  then have this test every 5 years.  You may need to have your cholesterol levels checked more often if:  Your lipid or cholesterol levels are high.  You are older than 58 years of age.  You are at high risk for heart disease.  CANCER SCREENING   Lung Cancer  Lung cancer screening is recommended for adults 42-26 years old who are at high risk for lung cancer because of a history of smoking.  A yearly low-dose CT scan of the lungs is recommended for people who:  Currently smoke.  Have quit within the past 15 years.  Have at least a 30-pack-year history of smoking. A pack year is smoking an average of one pack of cigarettes a day for 1 year.  Yearly screening should continue until it has been 15 years since you quit.  Yearly screening should stop if you develop a health problem that would prevent you from having lung cancer treatment.  Breast Cancer  Practice breast self-awareness. This means understanding how your breasts normally appear and feel.  It also means doing regular breast self-exams. Let your health care provider know about any changes, no matter how small.  If you are in your 20s or 30s, you should have a clinical breast exam (CBE) by a health care provider every 1-3 years as part of a regular health exam.  If you are 14 or older, have a CBE every year. Also consider having a breast X-ray (mammogram)  every year.  If you have a family history of breast cancer, talk to your health care provider about genetic screening.  If you are at high risk for breast cancer, talk to your health care provider about having an MRI and a mammogram every year.  Breast cancer gene (BRCA) assessment is recommended for women who have family members with BRCA-related cancers. BRCA-related cancers include:  Breast.  Ovarian.  Tubal.  Peritoneal cancers.  Results of the assessment will determine the need for genetic counseling and BRCA1 and BRCA2 testing. Cervical  Cancer Routine pelvic examinations to screen for cervical cancer are no longer recommended for nonpregnant women who are considered low risk for cancer of the pelvic organs (ovaries, uterus, and vagina) and who do not have symptoms. A pelvic examination may be necessary if you have symptoms including those associated with pelvic infections. Ask your health care provider if a screening pelvic exam is right for you.   The Pap test is the screening test for cervical cancer for women who are considered at risk.  If you had a hysterectomy for a problem that was not cancer or a condition that could lead to cancer, then you no longer need Pap tests.  If you are older than 65 years, and you have had normal Pap tests for the past 10 years, you no longer need to have Pap tests.  If you have had past treatment for cervical cancer or a condition that could lead to cancer, you need Pap tests and screening for cancer for at least 20 years after your treatment.  If you no longer get a Pap test, assess your risk factors if they change (such as having a new sexual partner). This can affect whether you should start being screened again.  Some women have medical problems that increase their chance of getting cervical cancer. If this is the case for you, your health care provider may recommend more frequent screening and Pap tests.  The human papillomavirus (HPV) test is another test that may be used for cervical cancer screening. The HPV test looks for the virus that can cause cell changes in the cervix. The cells collected during the Pap test can be tested for HPV.  The HPV test can be used to screen women 50 years of age and older. Getting tested for HPV can extend the interval between normal Pap tests from three to five years.  An HPV test also should be used to screen women of any age who have unclear Pap test results.  After 58 years of age, women should have HPV testing as often as Pap tests.  Colorectal  Cancer  This type of cancer can be detected and often prevented.  Routine colorectal cancer screening usually begins at 58 years of age and continues through 58 years of age.  Your health care provider may recommend screening at an earlier age if you have risk factors for colon cancer.  Your health care provider may also recommend using home test kits to check for hidden blood in the stool.  A small camera at the end of a tube can be used to examine your colon directly (sigmoidoscopy or colonoscopy). This is done to check for the earliest forms of colorectal cancer.  Routine screening usually begins at age 20.  Direct examination of the colon should be repeated every 5-10 years through 58 years of age. However, you may need to be screened more often if early forms of precancerous polyps or small growths are  found. Skin Cancer  Check your skin from head to toe regularly.  Tell your health care provider about any new moles or changes in moles, especially if there is a change in a mole's shape or color.  Also tell your health care provider if you have a mole that is larger than the size of a pencil eraser.  Always use sunscreen. Apply sunscreen liberally and repeatedly throughout the day.  Protect yourself by wearing long sleeves, pants, a wide-brimmed hat, and sunglasses whenever you are outside. HEART DISEASE, DIABETES, AND HIGH BLOOD PRESSURE   Have your blood pressure checked at least every 1-2 years. High blood pressure causes heart disease and increases the risk of stroke.  If you are between 8 years and 32 years old, ask your health care provider if you should take aspirin to prevent strokes.  Have regular diabetes screenings. This involves taking a blood sample to check your fasting blood sugar level.  If you are at a normal weight and have a low risk for diabetes, have this test once every three years after 58 years of age.  If you are overweight and have a high risk for  diabetes, consider being tested at a younger age or more often. PREVENTING INFECTION  Hepatitis B  If you have a higher risk for hepatitis B, you should be screened for this virus. You are considered at high risk for hepatitis B if:  You were born in a country where hepatitis B is common. Ask your health care provider which countries are considered high risk.  Your parents were born in a high-risk country, and you have not been immunized against hepatitis B (hepatitis B vaccine).  You have HIV or AIDS.  You use needles to inject street drugs.  You live with someone who has hepatitis B.  You have had sex with someone who has hepatitis B.  You get hemodialysis treatment.  You take certain medicines for conditions, including cancer, organ transplantation, and autoimmune conditions. Hepatitis C  Blood testing is recommended for:  Everyone born from [birth year] through 1965.  Anyone with known risk factors for hepatitis C. Sexually transmitted infections (STIs)  You should be screened for sexually transmitted infections (STIs) including gonorrhea and chlamydia if:  You are sexually active and are younger than 58 years of age.  You are older than 58 years of age and your health care provider tells you that you are at risk for this type of infection.  Your sexual activity has changed since you were last screened and you are at an increased risk for chlamydia or gonorrhea. Ask your health care provider if you are at risk.  If you do not have HIV, but are at risk, it may be recommended that you take a prescription medicine daily to prevent HIV infection. This is called pre-exposure prophylaxis (PrEP). You are considered at risk if:  You are sexually active and do not regularly use condoms or know the HIV status of your partner(s).  You take drugs by injection.  You are sexually active with a partner who has HIV. Talk with your health care provider about whether you are at high risk of  being infected with HIV. If you choose to begin PrEP, you should first be tested for HIV. You should then be tested every 3 months for as long as you are taking PrEP.  PREGNANCY   If you are premenopausal and you may become pregnant, ask your health care provider about preconception counseling.  If  you may become pregnant, take 400 to 800 micrograms (mcg) of folic acid every day.  If you want to prevent pregnancy, talk to your health care provider about birth control (contraception). OSTEOPOROSIS AND MENOPAUSE   Osteoporosis is a disease in which the bones lose minerals and strength with aging. This can result in serious bone fractures. Your risk for osteoporosis can be identified using a bone density scan.  If you are 100 years of age or older, or if you are at risk for osteoporosis and fractures, ask your health care provider if you should be screened.  Ask your health care provider whether you should take a calcium or vitamin D supplement to lower your risk for osteoporosis.  Menopause may have certain physical symptoms and risks.  Hormone replacement therapy may reduce some of these symptoms and risks. Talk to your health care provider about whether hormone replacement therapy is right for you.  HOME CARE INSTRUCTIONS   Schedule regular health, dental, and eye exams.  Stay current with your immunizations.   Do not use any tobacco products including cigarettes, chewing tobacco, or electronic cigarettes.  If you are pregnant, do not drink alcohol.  If you are breastfeeding, limit how much and how often you drink alcohol.  Limit alcohol intake to no more than 1 drink per day for nonpregnant women. One drink equals 12 ounces of beer, 5 ounces of wine, or 1 ounces of hard liquor.  Do not use street drugs.  Do not share needles.  Ask your health care provider for help if you need support or information about quitting drugs.  Tell your health care provider if you often feel  depressed.  Tell your health care provider if you have ever been abused or do not feel safe at home. Document Released: 08/18/2010 Document Revised: 06/19/2013 Document Reviewed: 01/04/2013 [facility] Patient Information 2015 [place], [place]. This information is not intended to replace advice given to you by your health care provider. Make sure you discuss any questions you have with your health care provider.

## 2014-02-04 NOTE — Assessment & Plan Note (Signed)

## 2014-02-05 ENCOUNTER — Encounter: Payer: 59 | Admitting: Internal Medicine

## 2014-03-13 ENCOUNTER — Encounter: Payer: Self-pay | Admitting: Internal Medicine

## 2014-07-11 ENCOUNTER — Other Ambulatory Visit: Payer: Self-pay | Admitting: Internal Medicine

## 2014-07-18 ENCOUNTER — Encounter: Payer: Self-pay | Admitting: Internal Medicine

## 2014-07-19 ENCOUNTER — Telehealth: Payer: Self-pay | Admitting: Internal Medicine

## 2014-07-19 NOTE — Telephone Encounter (Signed)
Message sent

## 2014-07-20 ENCOUNTER — Encounter: Payer: Self-pay | Admitting: Internal Medicine

## 2014-07-20 DIAGNOSIS — Z1211 Encounter for screening for malignant neoplasm of colon: Secondary | ICD-10-CM

## 2014-08-22 ENCOUNTER — Encounter: Payer: Self-pay | Admitting: Physical Therapy

## 2014-08-22 ENCOUNTER — Ambulatory Visit: Payer: 59 | Attending: Internal Medicine | Admitting: Physical Therapy

## 2014-08-22 DIAGNOSIS — M791 Myalgia: Secondary | ICD-10-CM | POA: Insufficient documentation

## 2014-08-22 DIAGNOSIS — M629 Disorder of muscle, unspecified: Secondary | ICD-10-CM | POA: Insufficient documentation

## 2014-08-22 DIAGNOSIS — M609 Myositis, unspecified: Secondary | ICD-10-CM | POA: Insufficient documentation

## 2014-08-22 DIAGNOSIS — R279 Unspecified lack of coordination: Secondary | ICD-10-CM | POA: Diagnosis not present

## 2014-08-22 DIAGNOSIS — IMO0001 Reserved for inherently not codable concepts without codable children: Secondary | ICD-10-CM

## 2014-08-22 NOTE — Patient Instructions (Signed)
Hand out on reverse kegels Food diary to complete and return at next session  Refrain from tightening of her belly muscles when driving and her "Kegel " exercises

## 2014-08-23 NOTE — Therapy (Signed)
[facility] [address] [place], [place], [address] Phone: [phone]   Fax:  [phone]  Physical Therapy Evaluation  Patient Details  Name: Heather BogaMaritza C Watson MRN: [id number] Date of Birth: [birth year] Referring Provider:  Sherlene Shamsullo, Teresa L, MD  Encounter Date: 08/22/2014      PT End of Session - 08/22/14 1712    Visit Number 1   Number of Visits 12   Date for PT Re-Evaluation 11/13/14   PT Start Time 1611   PT Stop Time 1700   PT Time Calculation (min) 49 min   Activity Tolerance Patient tolerated treatment well;Patient limited by pain   Behavior [facility] for tasks assessed/performed      Past Medical History  Diagnosis Date  . UTI (urinary tract infection)   . Gallstones     History reviewed. No pertinent past surgical history.  There were no vitals filed for this visit.  Visit Diagnosis:  Lack of coordination - Plan: PT plan of care cert/re-cert  Myalgia and myositis - Plan: PT plan of care cert/re-cert  Fascial defect - Plan: PT plan of care cert/re-cert      Subjective Assessment - 08/23/14 0802    Subjective SUI started 8+ years ago with 1 pad change/day with increased leakage w/ walking on treadmill 45 min-1 hr.  Constipation with bowel moemvents that occur with every other day (Bristol Stool Scale 1-2, 4). Pt used to rush in the morning and felt she had to strain as she did not have time for bowel movements. She currently tries to take her take time more in the morning to decrease straining.  Dyspareunia Sx (3-4/10) occurs with both insertion and penetration in all positions. Pt  currently uses estrogen cream for vaginal dryness. Pt uses olive oil as lubricant to decrease pain with insertion.     Pertinent History Hx of 3 vaginal deliveries with 3 epsiotomies and large babies (~9-10 #). Denied falls on tailbone. Prior workout included 59 yo of crunches, cardio, weight lifting. Current routine:  lifting 15 # dumbbells bilaterally, machines 60-70 # UE,/LE, elliptical , bicycle. Pt also reported  she  had been performing Kegels with strong contractions.     Patient Stated Goals "complete healing, no pain with intercourse, urianry continent and resolved constipation. "            [facility] PT Assessment - 08/23/14 0851    Assessment   Medical Diagnosis SUI    Precautions   Precautions None   Restrictions   Weight Bearing Restrictions No   Prior Function   Level of Independence Independent   Observation/Other Assessments   Observations crossed legs, slumped sitting   Other Surveys  --  FSFI 65%, PFDI 39% (lower % indicate greater function)    Palpation   Palpation comment significantly increased tensions in LQ of abdomen, pt dsiplayed holding of abdominal mm                 Pelvic Floor Special Questions - 08/23/14 0837    Diastasis Recti neg   Scar Episiotomy;Well healed   Perineal Body/Introitus  --  noted cervix positioned close but inside introitus   Pelvic Floor Internal Exam pt verbally consented and no contraindications noted   Exam Type Vaginal   Palpation increased posterior mm tensions, w/ posterior mm, stronger posterior contraction w/ cue, delayed relaxation of pelvic floor w/ cue to relax  pillow under hips allowed for further insertion of finger  Strength strong squeeze, against strong resistance  did not proceed with reps/ pt showed delayed relaxation          [facility] Adult PT Treatment/Exercise - 08/23/14 0804    Therapeutic Activites    Other Therapeutic Activities proper toileting posture w. breathing/relaxation of pelvic floor    Neuro Re-ed    Neuro Re-ed Details  Cues for diaphragmatic breathing w/ pelvic floor ROM, release anal sphincter   Manual Therapy   Internal Pelvic Floor perineal scar massage (R)   thiele massage and sustained pressure on coccygeus mm B                PT Education - 08/22/14 1711    Education provided  Yes   Education Details HEP, POC, goals, anatomy and physiology, ways to increase relaxation of abdominal mm and pelvic floor,    Person(s) Educated Patient   Methods Explanation;Demonstration;Tactile cues;Verbal cues;Handout   Comprehension Verbalized understanding             PT Long Term Goals - 08/23/14 0845    PT LONG TERM GOAL #1   Title Pt will decrease her PFDI score from 39% to < 30% in order to improve ADLs.    Time 12   Period Weeks   Status New   PT LONG TERM GOAL #2   Title Pt will decrease her score of 65% on FSFI to < 50% in order to tolerate pelvic exams and sexual intercourse.    Time 12   Period Weeks   Status New   PT LONG TERM GOAL #3   Title Pt will report walking on the treadmill for 45 min without leakage in order to return to PLOF w/ fitness activities.    Time 12   Period Weeks   Status New   PT LONG TERM GOAL #4   Title Pt will report having bowel movements daily (1x/day)  with Bristol Type of 3-4 in order to optimize abdominopelvic health.    Time 12   Period Weeks   Status New               Plan - 08/22/14 1714    Clinical Impression Statement  Pt is a 59 yo female whose S & Sx consist  of delayed relaxation of pelvic floor mm, increased mm tensions in LQ and pelvic floor,  decreased mobility of perineal scar, decreased fascial tensigrity to abdomino-perineal area. These deficits limit her ability to  eliminate bowel movements without straining, participate in sexual intercourse without pain, and have no urinary leakage with exercising.     Pt will benefit from skilled therapeutic intervention in order to improve on the following deficits Decreased balance;Postural dysfunction;Impaired sensation;Decreased mobility;Pain;Increased muscle spasms;Increased fascial restricitons;Decreased safety awareness;Decreased endurance;Decreased scar mobility;Decreased coordination;Decreased range of motion;Impaired flexibility;Improper body mechanics    Rehab Potential Good   PT Frequency 1x / week   PT Duration 12 weeks   PT Treatment/Interventions ADLs/Self Care Home Management;Moist Heat;Therapeutic activities;Therapeutic exercise;Traction;Aquatic Therapy;Biofeedback;Electrical Stimulation;Gait training;Functional mobility training;Stair training;Balance training;Neuromuscular re-education;Patient/family education;Scar mobilization;Manual techniques;Energy conservation;Dry needling   PT Next Visit Plan abdominal massage, intrarectal assessment, review food diary for fiber intake   Consulted and Agree with Plan of Care Patient         Problem List Patient Active Problem List   Diagnosis Date Noted  . History of meniscal tear 02/02/2014  . 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 Bacterial vaginosis 01/08/2013  . Encounter for preventive health examination 01/08/2013  . Dysuria 12/22/2012  .  Onychomycosis of toenail 11/29/2012  . Recurrent UTI 11/01/2012  . UTI (urinary tract infection) 10/04/2012  . Menopause 05/14/2012  . Unspecified constipation 05/14/2012  . 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 H/O thyroid cyst 05/14/2012    Mariane Masters  ,PT, DPT, E-RYT   08/23/2014, 8:55 AM  [facility] MAIN [facility] SERVICES [address] [place], [place], [address] Phone: [phone]   Fax:  [phone]

## 2014-08-29 ENCOUNTER — Ambulatory Visit: Payer: 59 | Admitting: Physical Therapy

## 2014-08-29 DIAGNOSIS — R279 Unspecified lack of coordination: Secondary | ICD-10-CM | POA: Diagnosis not present

## 2014-08-29 DIAGNOSIS — M629 Disorder of muscle, unspecified: Secondary | ICD-10-CM

## 2014-08-29 DIAGNOSIS — IMO0001 Reserved for inherently not codable concepts without codable children: Secondary | ICD-10-CM

## 2014-08-29 NOTE — Patient Instructions (Signed)
Handout on abdominal massage  Proper sitting posture/ small pelvic tilts in standing during work hours  Body scan audio

## 2014-08-31 NOTE — Therapy (Signed)
[place] [facility] MAIN [facility] SERVICES [address] [place], [place], [address] Phone: [phone]   Fax:  [phone]  Physical Therapy Treatment  Patient Details  Name: Heather Watson MRN: [id number] Date of Birth: Sep 04, 1955 Referring Provider:  Sherlene Shams, MD  Encounter Date: 08/29/2014      PT End of Session - 08/30/14 2359    Visit Number 2   Number of Visits 12   Date for PT Re-Evaluation 11/13/14   PT Start Time 1615   PT Stop Time 1710   PT Time Calculation (min) 55 min   Activity Tolerance Patient tolerated treatment well;Patient limited by pain   Behavior During Therapy [facility] for tasks assessed/performed      Past Medical History  Diagnosis Date  . UTI (urinary tract infection)   . Gallstones     No past surgical history on file.  There were no vitals filed for this visit.  Visit Diagnosis:  Lack of coordination  Myalgia and myositis  Fascial defect      Subjective Assessment - 08/29/14 1616    Subjective Pt reported she has tried to practice the breathing technique with bowel movements but she finds herself having to strain sometimes. Pt has had regular bowel momvents daily except for one day this week. Pt is taking Colace. Metamucil, and 3-4 prunes. Pt reported when she gets stressed or has anxiety, she has a tendency to go and have bowel movements.       Pertinent History Hx of 3 vaginal deliveries with 3 epsiotomies and large babies (~9-10 #). Denied falls on tailbone. Prior workout included 59 yo of crunches, cardio, weight lifting. Current routine: lifting 15 # dumbbells bilaterally, machines 60-70 # UE,/LE, elliptical , bicycle. Pt also reported  she  had been performing Kegels with strong contractions.     Patient Stated Goals "complete healing, no pain with intercourse, urianry continent and resolved constipation. "                      Pelvic Floor Special Questions - 08/29/14 1645    Pelvic  Floor Internal Exam pt verbally consented and no contraindications noted   Exam Type Rectal           [facility] Adult PT Treatment/Exercise - 08/30/14 2357    Self-Care   Self-Care --  body scan guided verbally , audio emailed to pt   Neuro Re-ed    Neuro Re-ed Details  cues for relaxation of pelvic floor   proper sitting posture, standing psoture, deep core engaged   Manual Therapy   Myofascial Release abdominal massage   guided pt also                PT Education - 08/30/14 2359    Education provided Yes   Education Details HEP   Person(s) Educated Patient   Methods Explanation;Demonstration;Tactile cues;Verbal cues;Handout;Other (comment)  audio file via email   Comprehension Verbalized understanding             PT Long Term Goals - 08/23/14 0845    PT LONG TERM GOAL #1   Title Pt will decrease her PFDI score from 39% to < 30% in order to improve ADLs.    Time 12   Period Weeks   Status New   PT LONG TERM GOAL #2   Title Pt will decrease her score of 65% on FSFI to < 50% in order to tolerate pelvic exams and sexual  intercourse.    Time 12   Period Weeks   Status New   PT LONG TERM GOAL #3   Title Pt will report walking on the treadmill for 45 min without leakage in order to return to PLOF w/ fitness activities.    Time 12   Period Weeks   Status New   PT LONG TERM GOAL #4   Title Pt will report having bowel movements daily (1x/day)  with Bristol Type of 3-4 in order to optimize abdominopelvic health.    Time 12   Period Weeks   Status New               Plan - 08/31/14 0000    Clinical Impression Statement Pt demo'd improved pelvic floor lengthening compared to last session but required more coordination training today. Pt has a Hx of self-selected habits of tightening abdominals, excessive contractions of pelvic floor mm, and stress-associated bowel movements. Therefore, pt benefited from biopsychosocial approaches today which included body  scan technique and abdominal massage. Pt responded well with report of feeling more relaxed.  Pt's abdominal tone appeared softer and anticipate these techniques will faciliate improved bowel movements as her food diary showed sufficient fiber.  Pt will continue to benefit from skilled PT.    Pt will benefit from skilled therapeutic intervention in order to improve on the following deficits Decreased balance;Postural dysfunction;Impaired sensation;Decreased mobility;Pain;Increased muscle spasms;Increased fascial restricitons;Decreased safety awareness;Decreased endurance;Decreased scar mobility;Decreased coordination;Decreased range of motion;Impaired flexibility;Improper body mechanics   Rehab Potential Good   PT Frequency 1x / week   PT Duration 12 weeks   PT Treatment/Interventions ADLs/Self Care Home Management;Moist Heat;Therapeutic activities;Therapeutic exercise;Traction;Aquatic Therapy;Biofeedback;Electrical Stimulation;Gait training;Functional mobility training;Stair training;Balance training;Neuromuscular re-education;Patient/family education;Scar mobilization;Manual techniques;Energy conservation;Dry needling   PT Next Visit Plan abdominal massage, intrarectal assessment, review food diary for fiber intake   Consulted and Agree with Plan of Care Patient        Problem List Patient Active Problem List   Diagnosis Date Noted  . History of meniscal tear 02/02/2014  . 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 Bacterial vaginosis 01/08/2013  . Encounter for preventive health examination 01/08/2013  . Dysuria 12/22/2012  . Onychomycosis of toenail 11/29/2012  . Recurrent UTI 11/01/2012  . UTI (urinary tract infection) 10/04/2012  . Menopause 05/14/2012  . Unspecified constipation 05/14/2012  . 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 H/O thyroid cyst 05/14/2012    Mariane MastersYeung,Shin Yiing ,PT, DPT, E-RYT  08/31/2014, 12:07 AM  [place] [facility] MAIN  [facility] [address] [place], [place], [address] Phone: [phone]   Fax:  [phone]

## 2014-09-05 ENCOUNTER — Ambulatory Visit: Payer: 59 | Admitting: Physical Therapy

## 2014-09-05 DIAGNOSIS — M629 Disorder of muscle, unspecified: Secondary | ICD-10-CM

## 2014-09-05 DIAGNOSIS — IMO0001 Reserved for inherently not codable concepts without codable children: Secondary | ICD-10-CM

## 2014-09-05 DIAGNOSIS — R279 Unspecified lack of coordination: Secondary | ICD-10-CM | POA: Diagnosis not present

## 2014-09-06 NOTE — Therapy (Signed)
[place] [facility] MAIN [facility] SERVICES [address] [place], [place], [address] Phone: [phone]   Fax:  [phone]  Physical Therapy Treatment  Patient Details  Name: KAMIYA ACORD MRN: [id number] Date of Birth: Jun 28, 1955 Referring Provider:  Sherlene Shams, MD  Encounter Date: 09/05/2014      PT End of Session - 09/06/14 1851    Visit Number 3   Number of Visits 12   Date for PT Re-Evaluation 11/13/14   PT Start Time 1610   PT Stop Time 1700   PT Time Calculation (min) 50 min   Activity Tolerance Patient tolerated treatment well;Patient limited by pain   Behavior During Therapy [facility] for tasks assessed/performed      Past Medical History  Diagnosis Date  . UTI (urinary tract infection)   . Gallstones     No past surgical history on file.  There were no vitals filed for this visit.  Visit Diagnosis:  Lack of coordination  Myalgia and myositis  Fascial defect      Subjective Assessment - 09/06/14 1842    Subjective Pt reported having performed HEP half of the week and found body scan to be helpful. Pt feels no change with her bowel movements since last session. Pt feels she has difficulty with elimination when at work due to the busyness of schedule.  Pt would like to have regular bowel momvents daily not every other day.  Pt feels she needs more time for bowel movments and she feels rushed.  .    Pertinent History Hx of 3 vaginal deliveries with 3 epsiotomies and large babies (~9-10 #). Denied falls on tailbone. Prior workout included 59 yo of crunches, cardio, weight lifting. Current routine: lifting 15 # dumbbells bilaterally, machines 60-70 # UE,/LE, elliptical , bicycle. Pt also reported  she  had been performing Kegels with strong contractions.     Patient Stated Goals "complete healing, no pain with intercourse, urianry continent and resolved constipation. "            [facility] PT Assessment - 09/06/14 1845    Palpation   Palpation comment significantly decreased tensions in LQ of abdomen, pt displayed decreased holding of abdominal mm  post neuro re-edu today                     [facility] Adult PT Treatment/Exercise - 09/06/14 0001    Self-Care   Self-Care --  body scan, explained importance of relaxation betw activitie   Neuro Re-ed    Neuro Re-ed Details  cues for diaphragmatic excursion, decreased abdominal tightening consistently during weight lifting. Advised pt to drop her weight from 15# to 10# with free weights.    semi tandem stance, not bilateral (bicep curls, tricep 10#)                PT Education - 09/06/14 1850    Education provided Yes   Education Details HEP: relaxaing abdominal muscles, changing perspsective on tightening belly as it impacts her GI system, emphasized importance of cool down, stretching, body scan practice after workout, in between work and  arriving home   Person(s) Educated Patient   Methods Demonstration;Explanation;Tactile cues;Verbal cues   Comprehension Verbalized understanding;Returned demonstration             PT Long Term Goals - 09/05/14 1617    PT LONG TERM GOAL #1   Title Pt will decrease her PFDI score from 39% to < 30% in order  to improve ADLs.    Time 12   Period Weeks   Status New   PT LONG TERM GOAL #2   Title Pt will decrease her score of 65% on FSFI to < 50% in order to tolerate pelvic exams and sexual intercourse.    Time 12   Period Weeks   Status New   PT LONG TERM GOAL #3   Title Pt will report walking on the treadmill for 45 min without leakage in order to return to PLOF w/ fitness activities.    Time 12   Period Weeks   Status New   PT LONG TERM GOAL #4   Title Pt will report having bowel movements daily (1x/day)  with Bristol Type of 3-4 in order to optimize abdominopelvic health.    Time 12   Period Weeks   Status On-going               Plan - 09/06/14 1852    Clinical Impression  Statement Pt demo'd softer abdomen as she has been compliant with belly massage. Pt's diet diary appears sufficient in fiber and rectal mm releases did not make an impact on her ability to defecate without straining. PT suspects pt's difficulty with bowel eliminatioin/constipation is related to pt's long Hx of tightening her belly/doing core workouts to have a flatter belly and also due to pt's lack of cool down/stretching post-workout and relaxation at work/ home. Pt will continue to benefit from skilled PT and biopsychosocial approaches to address her goals.      Pt will benefit from skilled therapeutic intervention in order to improve on the following deficits Decreased balance;Postural dysfunction;Impaired sensation;Decreased mobility;Pain;Increased muscle spasms;Increased fascial restricitons;Decreased safety awareness;Decreased endurance;Decreased scar mobility;Decreased coordination;Decreased range of motion;Impaired flexibility;Improper body mechanics   Rehab Potential Good   PT Frequency 1x / week   PT Duration 12 weeks   PT Treatment/Interventions ADLs/Self Care Home Management;Moist Heat;Therapeutic activities;Therapeutic exercise;Traction;Aquatic Therapy;Biofeedback;Electrical Stimulation;Gait training;Functional mobility training;Stair training;Balance training;Neuromuscular re-education;Patient/family education;Scar mobilization;Manual techniques;Energy conservation;Dry needling   PT Next Visit Plan reassess pelvic floor    Consulted and Agree with Plan of Care Patient        Problem List Patient Active Problem List   Diagnosis Date Noted  . History of meniscal tear 02/02/2014  . 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 Bacterial vaginosis 01/08/2013  . Encounter for preventive health examination 01/08/2013  . Dysuria 12/22/2012  . Onychomycosis of toenail 11/29/2012  . Recurrent UTI 11/01/2012  . UTI (urinary tract infection) 10/04/2012  . Menopause 05/14/2012  .  Unspecified constipation 05/14/2012  . 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 H/O thyroid cyst 05/14/2012    Mariane Masters ,PT, DPT, E-RYT  09/06/2014, 6:58 PM  [place] MAIN [facility] SERVICES [address] [place], [place], [address] Phone: [phone]   Fax:  [phone]

## 2014-09-12 ENCOUNTER — Ambulatory Visit: Payer: 59 | Admitting: Physical Therapy

## 2014-09-19 ENCOUNTER — Ambulatory Visit: Payer: 59 | Attending: Internal Medicine | Admitting: Physical Therapy

## 2014-09-19 DIAGNOSIS — M791 Myalgia: Secondary | ICD-10-CM | POA: Insufficient documentation

## 2014-09-19 DIAGNOSIS — M609 Myositis, unspecified: Secondary | ICD-10-CM | POA: Insufficient documentation

## 2014-09-19 DIAGNOSIS — M629 Disorder of muscle, unspecified: Secondary | ICD-10-CM | POA: Diagnosis present

## 2014-09-19 DIAGNOSIS — R279 Unspecified lack of coordination: Secondary | ICD-10-CM | POA: Insufficient documentation

## 2014-09-19 DIAGNOSIS — IMO0001 Reserved for inherently not codable concepts without codable children: Secondary | ICD-10-CM

## 2014-09-19 NOTE — Patient Instructions (Signed)
Increase grains: oatmeal 3x /week with soaked prunes/flax, rice cakes/ peanut butter/jam Separate veg and fruits  Carob based coffee alternative  Refrain from regular breakfast blender mix (poor food combination)

## 2014-09-20 NOTE — Therapy (Incomplete Revision)
[place] [facility] MAIN [facility] SERVICES [address] [place], [place], [address] Phone: [phone]   Fax:  [phone]  Physical Therapy Treatment  Patient Details  Name: Heather Watson MRN: [id number] Date of Birth: October 28, 1955 Referring Provider:  Sherlene Shams, MD  Encounter Date: 09/19/2014      PT End of Session - 09/20/14 2109    Visit Number 4   Number of Visits 12   Date for PT Re-Evaluation 11/13/14   PT Start Time 1605   PT Stop Time 1715   PT Time Calculation (min) 70 min   Activity Tolerance Patient tolerated treatment well;Patient limited by pain   Behavior During Therapy [facility] for tasks assessed/performed      Past Medical History  Diagnosis Date  . UTI (urinary tract infection)   . Gallstones     No past surgical history on file.  There were no vitals filed for this visit.  Visit Diagnosis:  Lack of coordination  Myalgia and myositis  Fascial defect      Subjective Assessment - 09/19/14 1609    Subjective Pt reported her urinary incontinence is more of her concern when she is moving fast, hiking, sneezing. Pt experienced 2 bouts of LBP in June (radiating from L/R) and July (radiating L)  that lasted 4 days with pain relief using Motrin.  Pt has been performing her HEP.  Pt plans to discontinue Colace, and will take Metmucil, prunes at night. Pt had to strain to have a bowel movement after 2 days of constipation. Pt had regular bowel momvents without straining for 4 days in a row/ week. Pt reports she frequently tries to prevent the passage of gas throughout the day and especially when walking on the treadmill. Pt stated she makes a blended drink every morning and combines spinach/ kale and fruit . Pt also does not include much grains/starches in her diet.      Pertinent History Hx of 3 vaginal deliveries with 3 epsiotomies and large babies (~9-10 #). Denied falls on tailbone. Prior workout included 59 yo of crunches,  cardio, weight lifting. Current routine: lifting 15 # dumbbells bilaterally, machines 60-70 # UE,/LE, elliptical , bicycle. Pt also reported  she  had been performing Kegels with strong contractions.     Patient Stated Goals "complete healing, no pain with intercourse, urianry continent and resolved constipation. "                      Pelvic Floor Special Questions - 09/20/14 2102    Scar --  no signifcant finding of scar immobility   Pelvic Floor Internal Exam pt verbally consented and no contraindications noted   Exam Type Vaginal   Palpation no mm tension/ tenderness bilaterally   Strength strong squeeze, against strong resistance  able to demo relaxation of pelvic floor mm properly   Strength # of reps 10   Strength # of seconds 10  QUICK FLICKS: 10 REPS  POST LONG HOLDS           [facility] Adult PT Treatment/Exercise - 09/20/14 0001    Self-Care   Other Self-Care Comments  educated heavily on food comboinations by referring/ loaning her a book. See instructions on details re: increasing fiber, promoting warm breakfast foods, better food combinations, rate of digestion  to fermation leading to gas    Neuro Re-ed    Neuro Re-ed Details  education for allowing pelvic floor to relax for releasing gas, suspect  poor pelvic floor control when walking on treadmill bc pt is contracting pelvic floor to prevent leakage of gas                     PT Long Term Goals - 09/20/14 2107    PT LONG TERM GOAL #1   Title Pt will decrease her PFDI score from 39% to < 30% in order to improve ADLs.    Time 12   Period Weeks   Status New   PT LONG TERM GOAL #2   Title Pt will decrease her score of 65% on FSFI to < 50% in order to tolerate pelvic exams and sexual intercourse.    Time 12   Period Weeks   Status New   PT LONG TERM GOAL #3   Title Pt will report walking on the treadmill for 45 min without leakage in order to return to PLOF w/ fitness activities.    Time 12    Period Weeks   Status New   PT LONG TERM GOAL #4   Title Pt will report having bowel movements consecutively across 4 days for 2 weeks in order to optimize abdominopelvic health.    Time 12   Period Weeks   PT LONG TERM GOAL #5   Title Pt will report a decrease in flatuence from 75% of the day to 25% of the day in order to maintain proper function of pelvic floor mm and gain continence when exercising.                Plan - 09/20/14 2110    Clinical Impression Statement Pt shows significant improvement with pelvic floor coordination with no mm tensions and proper coordination to achieve e 06/25/08/10. Suspect Pt's urinary incontinence    Pt will benefit from skilled therapeutic intervention in order to improve on the following deficits Decreased balance;Postural dysfunction;Impaired sensation;Decreased mobility;Pain;Increased muscle spasms;Increased fascial restricitons;Decreased safety awareness;Decreased endurance;Decreased scar mobility;Decreased coordination;Decreased range of motion;Impaired flexibility;Improper body mechanics   Rehab Potential Good   PT Frequency 1x / week   PT Duration 12 weeks   PT Treatment/Interventions ADLs/Self Care Home Management;Moist Heat;Therapeutic activities;Therapeutic exercise;Traction;Aquatic Therapy;Biofeedback;Electrical Stimulation;Gait training;Functional mobility training;Stair training;Balance training;Neuromuscular re-education;Patient/family education;Scar mobilization;Manual techniques;Energy conservation;Dry needling   PT Next Visit Plan reassess pelvic floor    Consulted and Agree with Plan of Care Patient        Problem List Patient Active Problem List   Diagnosis Date Noted  . History of meniscal tear 02/02/2014  . 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 Bacterial vaginosis 01/08/2013  . Encounter for preventive health examination 01/08/2013  . Dysuria 12/22/2012  . Onychomycosis of toenail 11/29/2012  . Recurrent  UTI 11/01/2012  . UTI (urinary tract infection) 10/04/2012  . Menopause 05/14/2012  . Unspecified constipation 05/14/2012  . 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 H/O thyroid cyst 05/14/2012    Mariane Masters 09/20/2014, 9:14 PM  [place] [facility] MAIN [facility] SERVICES [address] [place], [place], [address] Phone: [phone]   Fax:  [phone]

## 2014-09-20 NOTE — Therapy (Signed)
[place] [facility] MAIN [facility] SERVICES [address] [place], [place], [address] Phone: [phone]   Fax:  [phone]  Physical Therapy Treatment  Patient Details  Name: Heather Watson MRN: [id number] Date of Birth: May 04, 1955 Referring Provider:  Sherlene Shams, MD  Encounter Date: 09/19/2014      PT End of Session - 09/20/14 2109    Visit Number 4   Number of Visits 12   Date for PT Re-Evaluation 11/13/14   PT Start Time 1405   PT Stop Time 1515   PT Time Calculation (min) 70 min   Activity Tolerance Patient tolerated treatment well;Patient limited by pain   Behavior During Therapy [facility] for tasks assessed/performed      Past Medical History  Diagnosis Date  . UTI (urinary tract infection)   . Gallstones     No past surgical history on file.  There were no vitals filed for this visit.  Visit Diagnosis:  Lack of coordination  Myalgia and myositis  Fascial defect      Subjective Assessment - 09/19/14 1609    Subjective Pt reported her urinary incontinence is more of her concern when she is moving fast, hiking, sneezing. Pt experienced 2 bouts of LBP in June (radiating from L/R) and July (radiating L)  that lasted 4 days with pain relief using Motrin.  Pt has been performing her HEP.  Pt plans to discontinue Colace, and will take Metmucil, prunes at night. Pt had to strain to have a bowel movement after 2 days of constipation. Pt had regular bowel momvents without straining for 4 days in a row/ week. Pt reports she frequently tries to prevent the passage of gas throughout the day and especially when walking on the treadmill. Pt stated she makes a blended drink every morning and combines spinach/ kale and fruit . Pt also does not include much grains/starches in her diet.      Pertinent History Hx of 3 vaginal deliveries with 3 epsiotomies and large babies (~9-10 #). Denied falls on tailbone. Prior workout included 59 yo of crunches,  cardio, weight lifting. Current routine: lifting 15 # dumbbells bilaterally, machines 60-70 # UE,/LE, elliptical , bicycle. Pt also reported  she  had been performing Kegels with strong contractions.     Patient Stated Goals "complete healing, no pain with intercourse, urianry continent and resolved constipation. "                      Pelvic Floor Special Questions - 09/20/14 2102    Scar --  no signifcant finding of scar immobility   Pelvic Floor Internal Exam pt verbally consented and no contraindications noted   Exam Type Vaginal   Palpation no mm tension/ tenderness bilaterally   Strength strong squeeze, against strong resistance  able to demo relaxation of pelvic floor mm properly   Strength # of reps 10   Strength # of seconds 10  QUICK FLICKS: 10 REPS  POST LONG HOLDS           [facility] Adult PT Treatment/Exercise - 09/20/14 0001    Self-Care   Other Self-Care Comments  educated heavily on food comboinations by referring/ loaning her a book. See instructions on details re: increasing fiber, promoting warm breakfast foods, better food combinations, rate of digestion  to fermation leading to gas    Neuro Re-ed    Neuro Re-ed Details  education for allowing pelvic floor to relax for releasing gas, suspect  poor pelvic floor control when walking on treadmill bc pt is contracting pelvic floor to prevent leakage of gas                     PT Long Term Goals - 09/20/14 2107    PT LONG TERM GOAL #1   Title Pt will decrease her PFDI score from 39% to < 30% in order to improve ADLs.    Time 12   Period Weeks   Status New   PT LONG TERM GOAL #2   Title Pt will decrease her score of 65% on FSFI to < 50% in order to tolerate pelvic exams and sexual intercourse.    Time 12   Period Weeks   Status New   PT LONG TERM GOAL #3   Title Pt will report walking on the treadmill for 45 min without leakage in order to return to PLOF w/ fitness activities.    Time 12    Period Weeks   Status New   PT LONG TERM GOAL #4   Title Pt will report having bowel movements consecutively across 4 days for 2 weeks in order to optimize abdominopelvic health.    Time 12   Period Weeks   PT LONG TERM GOAL #5   Title Pt will report a decrease in flatuence from 75% of the day to 25% of the day in order to maintain proper function of pelvic floor mm and gain continence when exercising.                Plan - 09/20/14 2110    Clinical Impression Statement Pt shows significant improvement with increased softness of abdominal muscles, decreased pelvic floor mm tensions, and proper pelvic floor coordination w/ relaxation achieving grade strength/endurance of 06/25/08/10. Suspect pt's urinary incontinence is not due to pelvic floor weakness as assumed by pt, but is associated with her poor food combinations that causes frequent flatulence and her learned behavior to tighten anal sphinter to prevent leakage of gas when at work and on treadmill at the gym.  Thus, inability to relax pelvic floor during exertional activities may have lead to pelvic floor dysfunction and urinary incontinence. Pt was provided information about food combinations to try and limit her flatuence epsiodes. Pt will be on vacation after the next visit.      Pt will benefit from skilled therapeutic intervention in order to improve on the following deficits Decreased balance;Postural dysfunction;Impaired sensation;Decreased mobility;Pain;Increased muscle spasms;Increased fascial restricitons;Decreased safety awareness;Decreased endurance;Decreased scar mobility;Decreased coordination;Decreased range of motion;Impaired flexibility;Improper body mechanics   Rehab Potential Good   PT Frequency 1x / week   PT Duration 12 weeks   PT Treatment/Interventions ADLs/Self Care Home Management;Moist Heat;Therapeutic activities;Therapeutic exercise;Traction;Aquatic Therapy;Biofeedback;Electrical Stimulation;Gait  training;Functional mobility training;Stair training;Balance training;Neuromuscular re-education;Patient/family education;Scar mobilization;Manual techniques;Energy conservation;Dry needling   PT Next Visit Plan --   Consulted and Agree with Plan of Care Patient        Problem List Patient Active Problem List   Diagnosis Date Noted  . History of meniscal tear 02/02/2014  . 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 Bacterial vaginosis 01/08/2013  . Encounter for preventive health examination 01/08/2013  . Dysuria 12/22/2012  . Onychomycosis of toenail 11/29/2012  . Recurrent UTI 11/01/2012  . UTI (urinary tract infection) 10/04/2012  . Menopause 05/14/2012  . Unspecified constipation 05/14/2012  . 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 H/O thyroid cyst 05/14/2012    Mariane Masters ,PT, DPT, E-RYT  09/20/2014, 10:49 PM  [place] MAIN [facility] SERVICES [address] [place], [place], [address] Phone: [phone]   Fax:  [phone]

## 2014-09-20 NOTE — Therapy (Signed)
[facility] MAIN [facility] SERVICES [address] [place], [place], [address] Phone: [phone]   Fax:  [phone]  Physical Therapy Treatment  Patient Details  Name: Heather Watson MRN: [id number] Date of Birth: 06/10/1955 Referring Provider:  Sherlene Shams, MD  Encounter Date: 09/19/2014    Past Medical History  Diagnosis Date  . UTI (urinary tract infection)   . Gallstones     No past surgical history on file.  There were no vitals filed for this visit.  Visit Diagnosis:  Lack of coordination  Myalgia and myositis  Fascial defect      Subjective Assessment - 09/19/14 1609    Subjective Pt reported her urinary incontinence is more of her concern when she is moving fast, hiking, sneezing. Pt experienced 2 bouts of LBP in June (radiating from L/R) and July (radiating L)  that lasted 4 days with pain relief using Motrin.  Pt has been performing her HEP.  Pt plans to discontinue Colace, and will take Metmucil, prunes at night. Pt had to strain to have a bowel movement after 2 days of constipation. Pt had regular bowel momvents without straining for 4 days in a row/ week.     Pertinent History Hx of 3 vaginal deliveries with 3 epsiotomies and large babies (~9-10 #). Denied falls on tailbone. Prior workout included 59 yo of crunches, cardio, weight lifting. Current routine: lifting 15 # dumbbells bilaterally, machines 60-70 # UE,/LE, elliptical , bicycle. Pt also reported  she  had been performing Kegels with strong contractions.     Patient Stated Goals "complete healing, no pain with intercourse, urianry continent and resolved constipation. "                                      PT Long Term Goals - 09/19/14 1622    PT LONG TERM GOAL #1   Title Pt will decrease her PFDI score from 39% to < 30% in order to improve ADLs.    Time 12   Period Weeks   Status New   PT LONG TERM GOAL #2   Title Pt will  decrease her score of 65% on FSFI to < 50% in order to tolerate pelvic exams and sexual intercourse.    Time 12   Period Weeks   Status New   PT LONG TERM GOAL #3   Title Pt will report walking on the treadmill for 45 min without leakage in order to return to PLOF w/ fitness activities.    Time 12   Period Weeks   Status New   PT LONG TERM GOAL #4   Title Pt will report having bowel movements consecutively across 4 days for 2 weeks in order to optimize abdominopelvic health.    Time 12   Period Weeks               Problem List Patient Active Problem List   Diagnosis Date Noted  . History of meniscal tear 02/02/2014  . 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 Bacterial vaginosis 01/08/2013  . Encounter for preventive health examination 01/08/2013  . Dysuria 12/22/2012  . Onychomycosis of toenail 11/29/2012  . Recurrent UTI 11/01/2012  . UTI (urinary tract infection) 10/04/2012  . Menopause 05/14/2012  . Unspecified constipation 05/14/2012  . 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 H/O thyroid cyst 05/14/2012    Elisha Ponder, DPT, E-RYT  09/20/2014, 6:06 PM  [facility] MAIN [facility] SERVICES [address] [place], [place], [address] Phone: [phone]   Fax:  [phone]

## 2014-09-20 NOTE — Therapy (Signed)
[place] [facility] MAIN [facility] SERVICES [address] [place], [place], [address] Phone: [phone]   Fax:  [phone]  Physical Therapy Treatment  Patient Details  Name: Heather Watson MRN: [id number] Date of Birth: [birth year] Referring Provider:  Sherlene Shams, MD  Encounter Date: 09/19/2014      PT End of Session - 09/20/14 2109    Visit Number 4   Number of Visits 12   Date for PT Re-Evaluation 11/13/14   PT Start Time 1405   PT Stop Time 1515   PT Time Calculation (min) 70 min   Activity Tolerance Patient tolerated treatment well;Patient limited by pain   Behavior During Therapy [facility] for tasks assessed/performed      Past Medical History  Diagnosis Date  . UTI (urinary tract infection)   . Gallstones     No past surgical history on file.  There were no vitals filed for this visit.  Visit Diagnosis:  Lack of coordination  Myalgia and myositis  Fascial defect      Subjective Assessment - 09/19/14 1609    Subjective Pt reported her urinary incontinence is more of her concern when she is moving fast, hiking, sneezing. Pt experienced 2 bouts of LBP in June (radiating from L/R) and July (radiating L)  that lasted 4 days with pain relief using Motrin.  Pt has been performing her HEP.  Pt plans to discontinue Colace, and will take Metmucil, prunes at night. Pt had to strain to have a bowel movement after 2 days of constipation. Pt had regular bowel momvents without straining for 4 days in a row/ week. Pt reports she frequently tries to prevent the passage of gas throughout the day and especially when walking on the treadmill. Pt stated she makes a blended drink every morning and combines spinach/ kale and fruit . Pt also does not include much grains/starches in her diet.      Pertinent History Hx of 3 vaginal deliveries with 3 epsiotomies and large babies (~9-10 #). Denied falls on tailbone. Prior workout included 59 yo of crunches,  cardio, weight lifting. Current routine: lifting 15 # dumbbells bilaterally, machines 60-70 # UE,/LE, elliptical , bicycle. Pt also reported  she  had been performing Kegels with strong contractions.     Patient Stated Goals "complete healing, no pain with intercourse, urianry continent and resolved constipation. "                      Pelvic Floor Special Questions - 09/20/14 2102    Scar --  no signifcant finding of scar immobility   Pelvic Floor Internal Exam pt verbally consented and no contraindications noted   Exam Type Vaginal   Palpation no mm tension/ tenderness bilaterally   Strength strong squeeze, against strong resistance  able to demo relaxation of pelvic floor mm properly   Strength # of reps 10   Strength # of seconds 10  QUICK FLICKS: 10 REPS  POST LONG HOLDS           [facility] Adult PT Treatment/Exercise - 09/20/14 0001    Self-Care   Other Self-Care Comments  educated heavily on food comboinations by referring/ loaning her a book. See instructions on details re: increasing fiber, promoting warm breakfast foods, better food combinations, rate of digestion  to fermation leading to gas    Neuro Re-ed    Neuro Re-ed Details  education for allowing pelvic floor to relax for releasing gas, suspect  poor pelvic floor control when walking on treadmill bc pt is contracting pelvic floor to prevent leakage of gas                     PT Long Term Goals - 09/20/14 2107    PT LONG TERM GOAL #1   Title Pt will decrease her PFDI score from 39% to < 30% in order to improve ADLs.    Time 12   Period Weeks   Status New   PT LONG TERM GOAL #2   Title Pt will decrease her score of 65% on FSFI to < 50% in order to tolerate pelvic exams and sexual intercourse.    Time 12   Period Weeks   Status New   PT LONG TERM GOAL #3   Title Pt will report walking on the treadmill for 45 min without leakage in order to return to PLOF w/ fitness activities.    Time 12    Period Weeks   Status New   PT LONG TERM GOAL #4   Title Pt will report having bowel movements consecutively across 4 days for 2 weeks in order to optimize abdominopelvic health.    Time 12   Period Weeks   PT LONG TERM GOAL #5   Title Pt will report a decrease in flatuence from 75% of the day to 25% of the day in order to maintain proper function of pelvic floor mm and gain continence when exercising.                Plan - 09/20/14 2110    Clinical Impression Statement Pt shows significant improvement with pelvic floor coordination with no mm tensions and proper coordination to achieve e 06/25/08/10. Suspect t Pt's urinary incontinence    Pt will benefit from skilled therapeutic intervention in order to improve on the following deficits Decreased balance;Postural dysfunction;Impaired sensation;Decreased mobility;Pain;Increased muscle spasms;Increased fascial restricitons;Decreased safety awareness;Decreased endurance;Decreased scar mobility;Decreased coordination;Decreased range of motion;Impaired flexibility;Improper body mechanics   Rehab Potential Good   PT Frequency 1x / week   PT Duration 12 weeks   PT Treatment/Interventions ADLs/Self Care Home Management;Moist Heat;Therapeutic activities;Therapeutic exercise;Traction;Aquatic Therapy;Biofeedback;Electrical Stimulation;Gait training;Functional mobility training;Stair training;Balance training;Neuromuscular re-education;Patient/family education;Scar mobilization;Manual techniques;Energy conservation;Dry needling   PT Next Visit Plan reassess pelvic floor    Consulted and Agree with Plan of Care Patient        Problem List Patient Active Problem List   Diagnosis Date Noted  . History of meniscal tear 02/02/2014  . 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 Bacterial vaginosis 01/08/2013  . Encounter for preventive health examination 01/08/2013  . Dysuria 12/22/2012  . Onychomycosis of toenail 11/29/2012  .  Recurrent UTI 11/01/2012  . UTI (urinary tract infection) 10/04/2012  . Menopause 05/14/2012  . Unspecified constipation 05/14/2012  . 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 H/O thyroid cyst 05/14/2012    Mariane Masters 09/20/2014, 9:13 PM  [place] [facility] MAIN [facility] SERVICES [address] [place], [place], [address] Phone: [phone]   Fax:  [phone]

## 2014-09-20 NOTE — Therapy (Signed)
[place] [facility] MAIN [facility] SERVICES [address] [place], [place], [address] Phone: [phone]   Fax:  [phone]  Physical Therapy Treatment  Patient Details  Name: Heather Watson MRN: [id number] Date of Birth: May 04, 1955 Referring Provider:  Sherlene Shams, MD  Encounter Date: 09/19/2014      PT End of Session - 09/20/14 2109    Visit Number 4   Number of Visits 12   Date for PT Re-Evaluation 11/13/14   PT Start Time 1405   PT Stop Time 1515   PT Time Calculation (min) 70 min   Activity Tolerance Patient tolerated treatment well;Patient limited by pain   Behavior During Therapy [facility] for tasks assessed/performed      Past Medical History  Diagnosis Date  . UTI (urinary tract infection)   . Gallstones     No past surgical history on file.  There were no vitals filed for this visit.  Visit Diagnosis:  Lack of coordination  Myalgia and myositis  Fascial defect      Subjective Assessment - 09/19/14 1609    Subjective Pt reported her urinary incontinence is more of her concern when she is moving fast, hiking, sneezing. Pt experienced 2 bouts of LBP in June (radiating from L/R) and July (radiating L)  that lasted 4 days with pain relief using Motrin.  Pt has been performing her HEP.  Pt plans to discontinue Colace, and will take Metmucil, prunes at night. Pt had to strain to have a bowel movement after 2 days of constipation. Pt had regular bowel momvents without straining for 4 days in a row/ week. Pt reports she frequently tries to prevent the passage of gas throughout the day and especially when walking on the treadmill. Pt stated she makes a blended drink every morning and combines spinach/ kale and fruit . Pt also does not include much grains/starches in her diet.      Pertinent History Hx of 3 vaginal deliveries with 3 epsiotomies and large babies (~9-10 #). Denied falls on tailbone. Prior workout included 59 yo of crunches,  cardio, weight lifting. Current routine: lifting 15 # dumbbells bilaterally, machines 60-70 # UE,/LE, elliptical , bicycle. Pt also reported  she  had been performing Kegels with strong contractions.     Patient Stated Goals "complete healing, no pain with intercourse, urianry continent and resolved constipation. "                      Pelvic Floor Special Questions - 09/20/14 2102    Scar --  no signifcant finding of scar immobility   Pelvic Floor Internal Exam pt verbally consented and no contraindications noted   Exam Type Vaginal   Palpation no mm tension/ tenderness bilaterally   Strength strong squeeze, against strong resistance  able to demo relaxation of pelvic floor mm properly   Strength # of reps 10   Strength # of seconds 10  QUICK FLICKS: 10 REPS  POST LONG HOLDS           [facility] Adult PT Treatment/Exercise - 09/20/14 0001    Self-Care   Other Self-Care Comments  educated heavily on food comboinations by referring/ loaning her a book. See instructions on details re: increasing fiber, promoting warm breakfast foods, better food combinations, rate of digestion  to fermation leading to gas    Neuro Re-ed    Neuro Re-ed Details  education for allowing pelvic floor to relax for releasing gas, suspect  poor pelvic floor control when walking on treadmill bc pt is contracting pelvic floor to prevent leakage of gas                     PT Long Term Goals - 09/20/14 2107    PT LONG TERM GOAL #1   Title Pt will decrease her PFDI score from 39% to < 30% in order to improve ADLs.    Time 12   Period Weeks   Status New   PT LONG TERM GOAL #2   Title Pt will decrease her score of 65% on FSFI to < 50% in order to tolerate pelvic exams and sexual intercourse.    Time 12   Period Weeks   Status New   PT LONG TERM GOAL #3   Title Pt will report walking on the treadmill for 45 min without leakage in order to return to PLOF w/ fitness activities.    Time 12    Period Weeks   Status New   PT LONG TERM GOAL #4   Title Pt will report having bowel movements consecutively across 4 days for 2 weeks in order to optimize abdominopelvic health.    Time 12   Period Weeks   PT LONG TERM GOAL #5   Title Pt will report a decrease in flatuence from 75% of the day to 25% of the day in order to maintain proper function of pelvic floor mm and gain continence when exercising.                Plan - 09/20/14 2252    Clinical Impression Statement Pt shos significant improvement with increased softness of abdominal mm, decreased pelvic floor mm tensions, and proper pelvic floor coordination w/ relaxation and strength of 05/26/08/10. Suspect pt's urinary incontinence is not due to pelvic floor as assumed by pt, rather it is associated with her poor food combinations that cause frequent flatulence and a learned behavior to tighen anal sphincter to prevent leakage of gas, minimize embrassment when at work and on treadmill at the gym. Thus her hyperactivation of pelvic floor mm during exertional activities have lead to inefficient pelvic floor function and urinary incontinence. Pt was provided information about food combinations, and loaned a book in order to minimize her flatuence. Pt will be on vacation after her next visit.     Pt will benefit from skilled therapeutic intervention in order to improve on the following deficits Decreased balance;Postural dysfunction;Impaired sensation;Decreased mobility;Pain;Increased muscle spasms;Increased fascial restricitons;Decreased safety awareness;Decreased endurance;Decreased scar mobility;Decreased range of motion;Impaired flexibility;Improper body mechanics;Decreased coordination   Rehab Potential Good   PT Frequency 1x / week   PT Duration 12 weeks   PT Treatment/Interventions ADLs/Self Care Home Management;Moist Heat;Therapeutic activities;Therapeutic exercise;Traction;Aquatic Therapy;Biofeedback;Electrical Stimulation;Gait  training;Functional mobility training;Stair training;Balance training;Neuromuscular re-education;Patient/family education;Scar mobilization;Manual techniques;Energy conservation;Dry needling   Consulted and Agree with Plan of Care Patient        Problem List Patient Active Problem List   Diagnosis Date Noted  . History of meniscal tear 02/02/2014  . 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 Bacterial vaginosis 01/08/2013  . Encounter for preventive health examination 01/08/2013  . Dysuria 12/22/2012  . Onychomycosis of toenail 11/29/2012  . Recurrent UTI 11/01/2012  . UTI (urinary tract infection) 10/04/2012  . Menopause 05/14/2012  . Unspecified constipation 05/14/2012  . 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 H/O thyroid cyst 05/14/2012    Mariane Masters ,PT, DPT, E-RYT  09/20/2014, 10:59 PM  [place]  [facility] MAIN [facility] SERVICES [place], [place], [address] Phone: [phone]   Fax:  [phone]

## 2014-09-26 ENCOUNTER — Ambulatory Visit: Payer: 59 | Admitting: Physical Therapy

## 2014-09-26 DIAGNOSIS — M629 Disorder of muscle, unspecified: Secondary | ICD-10-CM

## 2014-09-26 DIAGNOSIS — R279 Unspecified lack of coordination: Secondary | ICD-10-CM

## 2014-09-26 DIAGNOSIS — IMO0001 Reserved for inherently not codable concepts without codable children: Secondary | ICD-10-CM

## 2014-09-26 NOTE — Patient Instructions (Addendum)
Continue to apply proper food combination, read up on the book "Food and Healing  by Marisue Ivan, PhD  Document the days that you have felt a need to tighten anus to prevent leakage of gas  Allow time in the am and pm for bowel eliminate to instill a regularity of schedule  Dynamic Stabilization Level 3-4

## 2014-09-27 NOTE — Therapy (Deleted)
[place] [facility] MAIN [facility] SERVICES [address] [place], [place], [address] Phone: [phone]   Fax:  [phone]  Patient Details  Name: Heather Watson MRN: [id number] Date of Birth: 10-Nov-1955 Referring Provider:  Sherlene Shams, MD  Encounter Date: 09/26/2014   Mariane Masters 09/27/2014, 5:06 PM  [place] [facility] MAIN [facility] SERVICES [address] [place], [place], [address] Phone: [phone]   Fax:  [phone]

## 2014-10-03 ENCOUNTER — Ambulatory Visit: Payer: 59 | Admitting: Physical Therapy

## 2014-10-15 ENCOUNTER — Encounter: Payer: Self-pay | Admitting: Family Medicine

## 2014-10-15 ENCOUNTER — Ambulatory Visit (INDEPENDENT_AMBULATORY_CARE_PROVIDER_SITE_OTHER): Payer: 59 | Admitting: Family Medicine

## 2014-10-15 VITALS — BP 114/72 | HR 71 | Temp 98.5°F | Ht 64.5 in

## 2014-10-15 DIAGNOSIS — R350 Frequency of micturition: Secondary | ICD-10-CM

## 2014-10-15 DIAGNOSIS — N3001 Acute cystitis with hematuria: Secondary | ICD-10-CM

## 2014-10-15 LAB — POCT URINALYSIS DIPSTICK
BILIRUBIN UA: NEGATIVE
Glucose, UA: 100
KETONES UA: NEGATIVE
Nitrite, UA: POSITIVE
PH UA: 7.5
Protein, UA: NEGATIVE
Spec Grav, UA: 1.01
Urobilinogen, UA: 0.2

## 2014-10-15 LAB — URINALYSIS, MICROSCOPIC ONLY: RBC / HPF: NONE SEEN (ref 0–?)

## 2014-10-15 MED ORDER — PHENAZOPYRIDINE HCL 95 MG PO TABS: 190.0000 mg | ORAL_TABLET | Freq: Three times a day (TID) | ORAL | Status: DC | PRN

## 2014-10-15 MED ORDER — CEPHALEXIN 500 MG PO CAPS: 500.0000 mg | ORAL_CAPSULE | Freq: Two times a day (BID) | ORAL | Status: DC

## 2014-10-15 MED ORDER — FLUCONAZOLE 150 MG PO TABS: 150.0000 mg | ORAL_TABLET | Freq: Once | ORAL | Status: DC

## 2014-10-15 NOTE — Progress Notes (Signed)
Pre visit review using our clinic review tool, if applicable. No additional management support is needed unless otherwise documented below in the visit note. 

## 2014-10-15 NOTE — Patient Instructions (Signed)
Nice to meet you. You likely have a UTI. We will treat you with keflex and pyridium.  Please also take the diflucan to help prevent yeast infection with this antibiotic. If you develop vaginal discharge, abdominal pain, fever, nausea, vomiting, diarrhea, or feel poorly please seek medical attention.

## 2014-10-15 NOTE — Assessment & Plan Note (Addendum)
Symptoms and UA consistent with UTI. No systemic symptoms. Vitals stable. No abdominal pain and benign abdominal exam today. Will treat with keflex. Will give pyridium rx as well to use for today given one day of use already. Given history of yeast infection with antibiotic treatment in the past will give Rx for diflucan for prophylaxis. Will send urine for culture and micro. Discussed possibility of prophylactic antibiotics for sexual intercourse and urlology referral if this becomes a recurrent issue. Given return precautions.

## 2014-10-15 NOTE — Progress Notes (Signed)
Patient ID: Heather Watson, female   DOB: 1955-02-23, 59 y.o.   MRN: [id number]  Marikay Alar, MD Phone: [phone]  Heather Watson is a 59 y.o. female who presents today for same day appointment.   Urinary frequency: patient reports one day of urinary frequency. Some urgaency. Some suprapubic pressure. Some goose bumps with suprapubic pressure. No abdominal pain. No nausea. No fevers. No vaginal discharge. Notes she typically gets UTIs if she has intercourse 3 or more times a week. Has been taking pyridium for this with some benefit. Was supposed to see urology for this several years ago though did not follow through with this. Patient notes that she urinates prior to and after intercourse. She notes she wears cotton underwear. She keeps the area clean as well. Reports history of yeast infections with antibiotics in the past.   PMH: nonsmoker. History of recurrent UTIs, though last UTI was 2-3 years ago.    ROS see HPI  Objective  Physical Exam Filed Vitals:   10/15/14 0845  BP: 114/72  Pulse: 71  Temp: 98.5 F (36.9 C)    Physical Exam  Constitutional: She is well-developed, well-nourished, and in no distress.  HENT:  Head: Normocephalic.  Cardiovascular: Normal rate and regular rhythm.  Exam reveals no gallop and no friction rub.   No murmur heard. Pulmonary/Chest: Effort normal and breath sounds normal. No respiratory distress. She has no wheezes. She has no rales.  Abdominal: Bowel sounds are normal. She exhibits no distension. There is no tenderness. There is no rebound and no guarding.  Neurological: She is alert.  Skin: Skin is warm and dry. She is not diaphoretic.     Assessment/Plan: Please see individual problem list.  UTI (urinary tract infection) Symptoms and UA consistent with UTI. No systemic symptoms. Vitals stable. No abdominal pain and benign abdominal exam today. Will treat with keflex. Will give pyridium rx as well to use for today given one  day of use already. Given history of yeast infection with antibiotic treatment in the past will give Rx for diflucan for prophylaxis. Will send urine for culture and micro. Discussed possibility of prophylactic antibiotics for sexual intercourse and urlology referral if this becomes a recurrent issue. Given return precautions.     Orders Placed This Encounter  Procedures  . Urine Culture  . Urine Microscopic Only  . POCT Urinalysis Dipstick    Marikay Alar

## 2014-10-17 ENCOUNTER — Ambulatory Visit: Payer: 59 | Admitting: Physical Therapy

## 2014-10-17 DIAGNOSIS — M629 Disorder of muscle, unspecified: Secondary | ICD-10-CM

## 2014-10-17 DIAGNOSIS — R279 Unspecified lack of coordination: Secondary | ICD-10-CM

## 2014-10-17 DIAGNOSIS — IMO0001 Reserved for inherently not codable concepts without codable children: Secondary | ICD-10-CM

## 2014-10-17 NOTE — Therapy (Addendum)
[place] [facility] MAIN [facility] SERVICES [address] [place], [place], [address] Phone: [phone]   Fax:  [phone]  Physical Therapy Treatment  Patient Details  Name: Heather Watson MRN: [id number] Date of Birth: Feb 23, 1955 Referring Provider:  Sherlene Shams, MD  Encounter Date: 09/26/2014      PT End of Session - 10/17/14 1429    Visit Number 5   Number of Visits 12   Date for PT Re-Evaluation 11/13/14   Activity Tolerance Patient tolerated treatment well;Patient limited by pain   Behavior During Therapy [facility] for tasks assessed/performed      Past Medical History  Diagnosis Date  . UTI (urinary tract infection)   . Gallstones     No past surgical history on file.  There were no vitals filed for this visit.  Visit Diagnosis:  Lack of coordination  Myalgia and myositis  Fascial defect      Subjective Assessment - 10/17/14 1418    Subjective Pt reported she noticd her flatuence has lessened as she has avoided mixing fruits and salads. Pt started adding 1/3 of cup oatmeal daily into her breafast.  Pt was constipated for 2 days and felt she needed to push a little to faciliate elimination. Pt noticed she only applied a small amount of force than she typically would with straining. Pt also reported she has had a self-conscious habit of holding in her gas most of her life.     Pertinent History Hx of 3 vaginal deliveries with 3 epsiotomies and large babies (~9-10 #). Denied falls on tailbone. Prior workout included 59 yo of crunches, cardio, weight lifting. Current routine: lifting 15 # dumbbells bilaterally, machines 60-70 # UE,/LE, elliptical , bicycle. Pt also reported  she  had been performing Kegels with strong contractions.     Patient Stated Goals "complete healing, no pain with intercourse, urianry continent and resolved constipation. "                         [facility] Adult PT Treatment/Exercise - 10/17/14 1421     Self-Care   Other Self-Care Comments  food combinations   biopsychosocial approaches allow flatulence. physiology   Neuro Re-ed    Neuro Re-ed Details  dynamic stabilization 3-4   Manual Therapy   Myofascial Release abdominal softness noted                     PT Long Term Goals - 09/26/14 1626    PT LONG TERM GOAL #1   Title Pt will decrease her PFDI score from 39% to < 30% in order to improve ADLs.    Time 12   Period Weeks   Status On-going   PT LONG TERM GOAL #2   Title Pt will decrease her score of 65% on FSFI to < 50% in order to tolerate pelvic exams and sexual intercourse.    Time 12   Period Weeks   Status Deferred   PT LONG TERM GOAL #3   Title Pt will report walking on the treadmill for 45 min without leakage in order to return to PLOF w/ fitness activities.    Time 12   Period Weeks   Status On-going   PT LONG TERM GOAL #4   Title Pt will report having bowel movements consecutively across 4 days for 2 weeks in order to optimize abdominopelvic health.    Time 12   Period Weeks  Status Not Met   PT LONG TERM GOAL #5   Title Pt will report a decrease in flatuence from 75% of the day to 25% of the day in order to maintain proper function of pelvic floor mm and gain continence when exercising.  (8/10: 60%)    Time 12   Period Weeks   Status Partially Met               Plan - 10/17/14 1424    Clinical Impression Statement Pt required education on continuing with food combinations as it appears to have had some improvement on her Sx. Applied biopsychosocial approaches to encourage her to allow herself to not hold in flatulence in order to maintain relaxation of pelvic floor mm. Pt progressed to dynamic stabilization level 3-4. Pt will be out of town next weeks and will be resuming PT in 2weeks. Discuss possible D/C.    Pt will benefit from skilled therapeutic intervention in order to improve on the following deficits Decreased balance;Postural  dysfunction;Impaired sensation;Decreased mobility;Pain;Increased muscle spasms;Increased fascial restricitons;Decreased safety awareness;Decreased endurance;Decreased scar mobility;Decreased range of motion;Impaired flexibility;Improper body mechanics;Decreased coordination   Rehab Potential Good   PT Frequency 1x / week   PT Duration 12 weeks   PT Treatment/Interventions ADLs/Self Care Home Management;Moist Heat;Therapeutic activities;Therapeutic exercise;Traction;Aquatic Therapy;Biofeedback;Electrical Stimulation;Gait training;Functional mobility training;Stair training;Balance training;Neuromuscular re-education;Patient/family education;Scar mobilization;Manual techniques;Energy conservation;Dry needling   PT Next Visit Plan reassess goals, possible D/C   Consulted and Agree with Plan of Care Patient        Problem List Patient Active Problem List   Diagnosis Date Noted  . History of meniscal tear 02/02/2014  . 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 Bacterial vaginosis 01/08/2013  . Encounter for preventive health examination 01/08/2013  . Dysuria 12/22/2012  . Onychomycosis of toenail 11/29/2012  . Recurrent UTI 11/01/2012  . UTI (urinary tract infection) 10/04/2012  . Menopause 05/14/2012  . Unspecified constipation 05/14/2012  . 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 H/O thyroid cyst 05/14/2012    Jerl Mina ,PT, DPT, E-RYT   10/17/2014, 2:31 PM  [place] MAIN [facility] SERVICES [address] [place], [place], [address] Phone: [phone]   Fax:  [phone]

## 2014-10-18 NOTE — Patient Instructions (Signed)
Use water based lubricants  Continue to moisturize vaginal tissue, consult MD re: increasing estrogen cream Do not return to performing kegels until flatuence issue gets resolved (due to her continuation of tightening her pelvic floor to prevent leakage of gas on a daily basis)  Maintain belly massage and pelvic floor breathing Yoga DVDs recommendations

## 2014-10-18 NOTE — Therapy (Signed)
[place] MAIN [facility] SERVICES [address] [place], [place], [address] Phone: [phone]   Fax:  [phone]  Physical Therapy Treatment  Patient Details  Name: Heather Watson MRN: [id number] Date of Birth: [birth year] Referring Provider:  Crecencio Mc, MD  Encounter Date: 10/17/2014      PT End of Session - 10/18/14 2313    Visit Number 6   Number of Visits 12   Date for PT Re-Evaluation 11/13/14   PT Start Time 1500   PT Stop Time 1605   PT Time Calculation (min) 65 min   Activity Tolerance    Behavior During Therapy Pt education/motivational interviewing to find strategies to address remaining deficits       Past Medical History  Diagnosis Date  . UTI (urinary tract infection)   . Gallstones     No past surgical history on file.  There were no vitals filed for this visit.  Visit Diagnosis:  Lack of coordination  Myalgia and myositis  Fascial defect      Subjective Assessment - 10/18/14 2248    Subjective Pt's constipation has improved with regular bowel movements for 9 out of 10 days. Pt states her urinary leakage has improved by 40%. Pt reported she recorded the number of times she held in her gas during the week which was 7/ 7 days. Pt tried refraining from combining vegetables and fruit for 10 days and ate them separately but still had gas with each.  Pt reported she has been avoided saturated fats for 15 years to manage gall stones.  pt has not worked with a Engineer, maintenance (IT) and has only been following her own self-selected diet. Pt reported her flatuence started  when she started having menopausal Sx 59 yo.      Pertinent History Hx of 3 vaginal deliveries with 3 epsiotomies and large babies (~9-10 #). Denied falls on tailbone. Prior workout included 59 yo of crunches, cardio, weight lifting. Current routine: lifting 15 # dumbbells bilaterally, machines 60-70 # UE,/LE, elliptical , bicycle. Pt also reported  she  had  been performing Kegels with strong contractions.     Patient Stated Goals "complete healing, no pain with intercourse, urianry continent and resolved constipation. "            [facility] PT Assessment - 10/18/14 2230    Observation/Other Assessments   Other Surveys  --  FSFI: 65% >  54%, PFDI: 65% > 54%                      [facility] Adult PT Treatment/Exercise - 10/18/14 2239    Self-Care   Other Self-Care Comments  motivational interviewing: flatuence problem starting w/ menopausal Sx. Provided resource for acupuncturist.                 PT Education - 10/18/14 2248    Education provided Yes   Education Details D/C, emphasize relaxation of pelvic floor mm, seek additional help w/ digestive issues to resolve flatuence which will in turn decrease hyperactivity of pelvic floor mm    Person(s) Educated Patient   Methods Explanation;Other (comment)             PT Long Term Goals - 10/18/14 2241    PT LONG TERM GOAL #1   Title Pt will decrease her PFDI score from 39% to < 30% in order to improve ADLs. (10/17/14: 35%)     Time 12   Period Weeks  Status Partially Met   PT LONG TERM GOAL #2   Title Pt will decrease her score of 65% on FSFI to < 50% in order to tolerate pelvic exams and sexual intercourse. (10/17/14: FSFI:  54%)   Time 12   Period Weeks   Status Partially Met   PT LONG TERM GOAL #3   Title Pt will report walking on the treadmill for 45 min without leakage in order to return to PLOF w/ fitness activities.    Time 12   Period Weeks   Status Deferred   PT LONG TERM GOAL #4   Title Pt will report having bowel movements consecutively across 4 days for 2 weeks in order to optimize abdominopelvic health.    Time 12   Period Weeks   Status Achieved   PT LONG TERM GOAL #5   Title Pt will report a decrease in flatuence from 75% of the day to 25% of the day in order to maintain proper function of pelvic floor mm and gain continence when exercising.   (8/10: 60%)    Time 12   Period Weeks   Status Partially Met               Plan - 10/18/14 2250    Clinical Impression Statement Pt has achieved 1 goal and partially met 3 goals. Pt's constipation Sx have improved and now has regular bowel movements without straining in addition to slight imporvement with urinary incontinence. Pt's sexual  function has also improved w/ use of lubricants. Pt also has demo'd significantly improved pelvic floor ROM which plays an important role in regaining sexual function. Pt's remaining deficits involve her inflatuence problem which leads to her tightening her pelvic floor on a regular basis. Recommendations during rehab to combine foods properly was not successful in decreasing inflatuence.  PT recommended her to see an acupuncturist to address this issue which may be related to menopause because she revealed through motivational interviewing that her flatuence problem started with menopause. Pt agreed to D/C and will understands the ways to avoid hyperactivity of pelvic floor mm/abdominals in order to minimize relapse of Sx.     Pt will benefit from skilled therapeutic intervention in order to improve on the following deficits Decreased balance;Postural dysfunction;Impaired sensation;Decreased mobility;Pain;Increased muscle spasms;Increased fascial restricitons;Decreased safety awareness;Decreased endurance;Decreased scar mobility;Decreased range of motion;Impaired flexibility;Improper body mechanics;Decreased coordination   Rehab Potential Good   PT Frequency 1x / week   PT Duration 12 weeks   PT Treatment/Interventions ADLs/Self Care Home Management;Moist Heat;Therapeutic activities;Therapeutic exercise;Traction;Aquatic Therapy;Biofeedback;Electrical Stimulation;Gait training;Functional mobility training;Stair training;Balance training;Neuromuscular re-education;Patient/family education;Scar mobilization;Manual techniques;Energy conservation;Dry needling   PT  Next Visit Plan D/C   Consulted and Agree with Plan of Care Patient        Problem List Patient Active Problem List   Diagnosis Date Noted  . History of meniscal tear 02/02/2014  . 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 Bacterial vaginosis 01/08/2013  . Encounter for preventive health examination 01/08/2013  . Dysuria 12/22/2012  . Onychomycosis of toenail 11/29/2012  . Recurrent UTI 11/01/2012  . UTI (urinary tract infection) 10/04/2012  . Menopause 05/14/2012  . Unspecified constipation 05/14/2012  . 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 H/O thyroid cyst 05/14/2012    Jerl Mina  ,PT, DPT, E-RYT  10/18/2014, 11:13 PM  [place] MAIN [facility] SERVICES [address] [place], [place], [address] Phone: [phone]   Fax:  [phone]

## 2014-10-19 LAB — URINE CULTURE: Colony Count: >=100000

## 2014-12-21 ENCOUNTER — Ambulatory Visit: Payer: Self-pay | Admitting: Physician Assistant

## 2014-12-21 VITALS — BP 100/60 | HR 78 | Temp 97.8°F

## 2014-12-21 DIAGNOSIS — N390 Urinary tract infection, site not specified: Secondary | ICD-10-CM

## 2014-12-21 LAB — POCT URINALYSIS DIPSTICK
Bilirubin, UA: NEGATIVE
Glucose, UA: NEGATIVE
KETONES UA: NEGATIVE
Nitrite, UA: POSITIVE
PH UA: 7
PROTEIN UA: NEGATIVE
SPEC GRAV UA: 1.01
Urobilinogen, UA: 0.2

## 2014-12-21 MED ORDER — PHENAZOPYRIDINE HCL 200 MG PO TABS
200.0000 mg | ORAL_TABLET | Freq: Three times a day (TID) | ORAL | Status: DC | PRN
Start: 2014-12-21 — End: 2015-01-15

## 2014-12-21 MED ORDER — CIPROFLOXACIN HCL 250 MG PO TABS: 250.0000 mg | ORAL_TABLET | Freq: Two times a day (BID) | ORAL | Status: DC

## 2014-12-21 NOTE — Progress Notes (Signed)
S:  C/o uti sx for 2 days, burning, urgency, frequency, denies vaginal discharge, abdominal pain or flank pain:  Remainder ros neg, states usually happens after she has sex 2 days in a row  O:  Vitals wnl, nad, no cva tenderness, back nontender, lungs c t a,cv rrr, abd soft nontender, bs normal, n/v intact  A: uti  P: cipro 250mg  bid x 7d, pyridium 200mg   increase water intake, add cranberry juice, return if not improving in 2 -3 days, return earlier if worsening, discussed pyelonephritis sx

## 2015-01-15 ENCOUNTER — Encounter: Payer: Self-pay | Admitting: Internal Medicine

## 2015-01-15 ENCOUNTER — Ambulatory Visit (INDEPENDENT_AMBULATORY_CARE_PROVIDER_SITE_OTHER): Payer: 59 | Admitting: Internal Medicine

## 2015-01-15 ENCOUNTER — Other Ambulatory Visit (HOSPITAL_COMMUNITY)
Admission: RE | Admit: 2015-01-15 | Discharge: 2015-01-15 | Disposition: A | Discharge status: Home / Self Care | Payer: 59 | Source: Ambulatory Visit | Attending: Internal Medicine | Admitting: Internal Medicine

## 2015-01-15 VITALS — BP 108/70 | HR 67 | Temp 98.8°F | Resp 12 | Ht 64.25 in | Wt 132.0 lb

## 2015-01-15 DIAGNOSIS — Z113 Encounter for screening for infections with a predominantly sexual mode of transmission: Secondary | ICD-10-CM

## 2015-01-15 DIAGNOSIS — Z124 Encounter for screening for malignant neoplasm of cervix: Secondary | ICD-10-CM

## 2015-01-15 DIAGNOSIS — R5383 Other fatigue: Secondary | ICD-10-CM

## 2015-01-15 DIAGNOSIS — B351 Tinea unguium: Secondary | ICD-10-CM

## 2015-01-15 DIAGNOSIS — Z0000 Encounter for general adult medical examination without abnormal findings: Secondary | ICD-10-CM | POA: Diagnosis not present

## 2015-01-15 DIAGNOSIS — E785 Hyperlipidemia, unspecified: Secondary | ICD-10-CM | POA: Diagnosis not present

## 2015-01-15 DIAGNOSIS — Z1239 Encounter for other screening for malignant neoplasm of breast: Secondary | ICD-10-CM

## 2015-01-15 DIAGNOSIS — N390 Urinary tract infection, site not specified: Secondary | ICD-10-CM

## 2015-01-15 DIAGNOSIS — Z1151 Encounter for screening for human papillomavirus (HPV): Secondary | ICD-10-CM | POA: Diagnosis not present

## 2015-01-15 DIAGNOSIS — K5909 Other constipation: Secondary | ICD-10-CM

## 2015-01-15 DIAGNOSIS — Z01411 Encounter for gynecological examination (general) (routine) with abnormal findings: Secondary | ICD-10-CM | POA: Insufficient documentation

## 2015-01-15 MED ORDER — NITROFURANTOIN MONOHYD MACRO 100 MG PO CAPS: 100.0000 mg | ORAL_CAPSULE | Freq: Two times a day (BID) | ORAL | Status: DC

## 2015-01-15 MED ORDER — ZOSTER VACCINE LIVE 19400 UNT/0.65ML ~~LOC~~ SOLR: 0.6500 mL | Freq: Once | SUBCUTANEOUS | Status: DC

## 2015-01-15 NOTE — Progress Notes (Signed)
Patient ID: Heather Watson, female    DOB: [birth year]  Age: 59 y.o. MRN: [id number]  The patient is here for annual physical examination and management of other chronic and acute problems.  Last seen by me dec 2015   The risk factors are reflected in the social history.  The roster of all physicians providing medical care to patient - is listed in the Snapshot section of the chart.   Home safety : The patient has smoke detectors in the home. They wear seatbelts.  There are no firearms at home. There is no violence in the home.   There is no risks for hepatitis, STDs or HIV. There is no   history of blood transfusion. They have no travel history to infectious disease endemic areas of the world.  The patient has seen their dentist in the last six month. They have seen their eye doctor in the last year. They admit to slight hearing difficulty with regard to whispered voices and some television programs.  They have deferred audiologic testing in the last year.  They do not  have excessive sun exposure. Discussed the need for sun protection: hats, long sleeves and use of sunscreen if there is significant sun exposure.   Diet: the importance of a healthy diet is discussed. They do have a healthy diet.  The benefits of regular aerobic exercise were discussed. She walks 4 times per week ,  20 minutes.   Depression screen: there are no signs or vegative symptoms of depression- irritability, change in appetite, anhedonia, sadness/tearfullness.  The following portions of the patient's history were reviewed and updated as appropriate: allergies, current medications, past family history, past medical history,  past surgical history, past social history  and problem list.  Visual acuity was not assessed per patient preference since she has regular follow up with her ophthalmologist. Hearing and body mass index were assessed and reviewed.   During the course of the visit the patient was educated and  counseled about appropriate screening and preventive services including : fall prevention , diabetes screening, nutrition counseling, colorectal cancer screening, and recommended immunizations.    CC: The primary encounter diagnosis was Cervical cancer screening. Diagnoses of Breast cancer screening, Other fatigue, Hyperlipidemia, Screen for STD (sexually transmitted disease), Other constipation, Recurrent UTI, Onychomycosis of toenail, and Encounter for preventive health examination were also pertinent to this visit.  Patient requesting a  PAP smear last one 2014 Treated for UTI Nov 4 with cipro x 7 days  Treated in August for UTI  E Coli Keflex.  Not all UTis occurring after sex. Wants to use macrobid prophylaxis after sex    culturelle and Kombucha recommended  for daily probiotics  Chronic constipation.   managed with daily use of metamucil,  prunes,  Fiber cereal,  Water.  Dyspareunia secondary to dryness,  Sign premarin vaginal cream,  Olive oil after mutliple trials.   Toenail fungus left  great toenail.  Nail discolored from use of tea tree oil    History Erandi has a past medical history of UTI (urinary tract infection) and Gallstones.   She has no past surgical history on file.   Her family history includes Arthritis in her mother; Cancer in her mother and sister; Heart attack in her maternal grandfather; Hypertension in her brother, brother, father, mother, and sister; Stroke in her father and paternal grandfather.She reports that she quit smoking about 33 years ago. Her smoking use included Cigarettes. She does not have any smokeless tobacco  history on file. She reports that she drinks alcohol. She reports that she does not use illicit drugs.  Outpatient Prescriptions Prior to Visit  Medication Sig Dispense Refill  . ALPRAZolam (XANAX) 0.25 MG tablet Take 1 tablet (0.25 mg total) by mouth at bedtime as needed for sleep. 30 tablet 2  . Ascorbic Acid (VITAMIN C) 1000 MG tablet  Take 1,000 mg by mouth daily.    Marland Kitchen. BIOTIN PO Take 1 tablet by mouth daily.    Marland Kitchen. GLUCOSAMINE HCL PO Take 1 tablet by mouth daily.    . Omega 3-6-9 Fatty Acids LIQD 2.5 teaspoons daily.    Marland Kitchen. PREMARIN vaginal cream INSERT 1 GRAM VAGINALLY AT NIGHT TWICE A WEEK. 30 g 5  . Cholecalciferol (VITAMIN D) 2000 UNITS tablet Take 2,000 Units by mouth daily.    Marland Kitchen. Bioflavonoid Products (ESTER C PO) 1000mg  daily    . calcium carbonate (OS-CAL) 600 MG TABS tablet Take 600 mg by mouth daily.    . cephALEXin (KEFLEX) 500 MG capsule Take 1 capsule (500 mg total) by mouth 2 (two) times daily. 14 capsule 0  . ciprofloxacin (CIPRO) 250 MG tablet Take 1 tablet (250 mg total) by mouth 2 (two) times daily. 14 tablet 0  . fluconazole (DIFLUCAN) 150 MG tablet Take 1 tablet (150 mg total) by mouth once. 1 tablet 0  . meloxicam (MOBIC) 7.5 MG tablet Take 1 tablet (7.5 mg total) by mouth daily. As needed for joint pain 90 tablet 1  . phenazopyridine (PYRIDIUM) 200 MG tablet Take 1 tablet (200 mg total) by mouth 3 (three) times daily as needed for pain. 10 tablet 0  . vitamin E 400 UNIT capsule Take 400 Units by mouth daily.     No facility-administered medications prior to visit.    Review of Systems  Objective:  BP 108/70 mmHg  Pulse 67  Temp(Src) 98.8 F (37.1 C) (Oral)  Resp 12  Ht 5' 4.25" (1.632 m)  Wt 132 lb (59.875 kg)  BMI 22.48 kg/m2  SpO2 99%  LMP 04/12/2006  Physical Exam    Assessment & Plan:   Problem List Items Addressed This Visit    Constipation    Advised to try miralax and stool softener daily,  .       Recurrent UTI    Post coital macrobid prescribed.       Relevant Medications   nitrofurantoin, macrocrystal-monohydrate, (MACROBID) 100 MG capsule   zoster vaccine live, PF, (ZOSTAVAX) [id number] UNT/0.65ML injection   Onychomycosis of toenail    Advised to try using cider vinegar given the nail discoloration with tea tree oil      Relevant Medications   zoster vaccine live, PF,  (ZOSTAVAX) [id number] UNT/0.65ML injection   Encounter for preventive health examination    Annual comprehensive preventive exam was done as well as an evaluation and management of chronic conditions .  During the course of the visit the patient was educated and counseled about appropriate screening and preventive services including :  diabetes screening, lipid analysis with projected  10 year  risk for CAD   using the Framingham risk calculator for women, , nutrition counseling, colorectal cancer screening, and recommended immunizations.  Printed recommendations for health maintenance screenings was given.        Other Visit Diagnoses    Cervical cancer screening    -  Primary    Relevant Orders    Cytology - PAP    Breast cancer screening  Relevant Orders    MM DIGITAL SCREENING BILATERAL    Other fatigue        Relevant Orders    TSH    CBC with Differential/Platelet    Hyperlipidemia        Relevant Orders    Lipid panel    Screen for STD (sexually transmitted disease)        Relevant Orders    Hepatitis C antibody    HIV antibody       I have discontinued Ms. Cenci's Bioflavonoid Products (ESTER C PO), calcium carbonate, meloxicam, vitamin E, cephALEXin, fluconazole, ciprofloxacin, and phenazopyridine. I am also having her start on nitrofurantoin (macrocrystal-monohydrate) and zoster vaccine live (PF). Additionally, I am having her maintain her Omega 3-6-9 Fatty Acids, Vitamin D, GLUCOSAMINE HCL PO, vitamin C, BIOTIN PO, ALPRAZolam, and PREMARIN.  Meds ordered this encounter  Medications  . nitrofurantoin, macrocrystal-monohydrate, (MACROBID) 100 MG capsule    Sig: Take 1 capsule (100 mg total) by mouth 2 (two) times daily.    Dispense:  14 capsule    Refill:  3  . zoster vaccine live, PF, (ZOSTAVAX) 16109 UNT/0.65ML injection    Sig: Inject 19,400 Units into the skin once.    Dispense:  1 each    Refill:  0    Medications Discontinued During This Encounter   Medication Reason  . cephALEXin (KEFLEX) 500 MG capsule Error  . calcium carbonate (OS-CAL) 600 MG TABS tablet Error  . Bioflavonoid Products (ESTER C PO) Error  . ciprofloxacin (CIPRO) 250 MG tablet Error  . fluconazole (DIFLUCAN) 150 MG tablet Error  . meloxicam (MOBIC) 7.5 MG tablet Patient Preference  . phenazopyridine (PYRIDIUM) 200 MG tablet Completed Course  . vitamin E 400 UNIT capsule Error    Follow-up: No Follow-up on file.   Sherlene Shams, MD

## 2015-01-15 NOTE — Assessment & Plan Note (Signed)

## 2015-01-15 NOTE — Assessment & Plan Note (Signed)
Advised to try miralax and stool softener daily,  .

## 2015-01-15 NOTE — Assessment & Plan Note (Signed)
Post coital macrobid prescribed.

## 2015-01-15 NOTE — Patient Instructions (Addendum)
Try  Either Culturelle or  Add "Kombucha" ( a commerically available probiotic juice)  to your juice for your probiotic  Macrobid for after sex UTI prophylaxis   Try miralax and daily stool softener instead of metamucil for your constipation   Menopause is a normal process in which your reproductive ability comes to an end. This process happens gradually over a span of months to years, usually between the ages of 102 and 42. Menopause is complete when you have missed 12 consecutive menstrual periods. It is important to talk with your health care provider about some of the most common conditions that affect postmenopausal women, such as heart disease, cancer, and bone loss (osteoporosis). Adopting a healthy lifestyle and getting preventive care can help to promote your health and wellness. Those actions can also lower your chances of developing some of these common conditions. WHAT SHOULD I KNOW ABOUT MENOPAUSE? During menopause, you may experience a number of symptoms, such as:  Moderate-to-severe hot flashes.  Night sweats.  Decrease in sex drive.  Mood swings.  Headaches.  Tiredness.  Irritability.  Memory problems.  Insomnia. Choosing to treat or not to treat menopausal changes is an individual decision that you make with your health care provider. WHAT SHOULD I KNOW ABOUT HORMONE REPLACEMENT THERAPY AND SUPPLEMENTS? Hormone therapy products are effective for treating symptoms that are associated with menopause, such as hot flashes and night sweats. Hormone replacement carries certain risks, especially as you become older. If you are thinking about using estrogen or estrogen with progestin treatments, discuss the benefits and risks with your health care provider. WHAT SHOULD I KNOW ABOUT HEART DISEASE AND STROKE? Heart disease, heart attack, and stroke become more likely as you age. This may be due, in part, to the hormonal changes that your body experiences during menopause. These  can affect how your body processes dietary fats, triglycerides, and cholesterol. Heart attack and stroke are both medical emergencies. There are many things that you can do to help prevent heart disease and stroke:  Have your blood pressure checked at least every 1-2 years. High blood pressure causes heart disease and increases the risk of stroke.  If you are 38-95 years old, ask your health care provider if you should take aspirin to prevent a heart attack or a stroke.  Do not use any tobacco products, including cigarettes, chewing tobacco, or electronic cigarettes. If you need help quitting, ask your health care provider.  It is important to eat a healthy diet and maintain a healthy weight.  Be sure to include plenty of vegetables, fruits, low-fat dairy products, and lean protein.  Avoid eating foods that are high in solid fats, added sugars, or salt (sodium).  Get regular exercise. This is one of the most important things that you can do for your health.  Try to exercise for at least 150 minutes each week. The type of exercise that you do should increase your heart rate and make you sweat. This is known as moderate-intensity exercise.  Try to do strengthening exercises at least twice each week. Do these in addition to the moderate-intensity exercise.  Know your numbers.Ask your health care provider to check your cholesterol and your blood glucose. Continue to have your blood tested as directed by your health care provider. WHAT SHOULD I KNOW ABOUT CANCER SCREENING? There are several types of cancer. Take the following steps to reduce your risk and to catch any cancer development as early as possible. Breast Cancer  Practice breast self-awareness.  This means understanding how your breasts normally appear and feel.  It also means doing regular breast self-exams. Let your health care provider know about any changes, no matter how small.  If you are 70 or older, have a clinician do a  breast exam (clinical breast exam or CBE) every year. Depending on your age, family history, and medical history, it may be recommended that you also have a yearly breast X-ray (mammogram).  If you have a family history of breast cancer, talk with your health care provider about genetic screening.  If you are at high risk for breast cancer, talk with your health care provider about having an MRI and a mammogram every year.  Breast cancer (BRCA) gene test is recommended for women who have family members with BRCA-related cancers. Results of the assessment will determine the need for genetic counseling and BRCA1 and for BRCA2 testing. BRCA-related cancers include these types:  Breast. This occurs in males or females.  Ovarian.  Tubal. This may also be called fallopian tube cancer.  Cancer of the abdominal or pelvic lining (peritoneal cancer).  Prostate.  Pancreatic. Cervical, Uterine, and Ovarian Cancer Your health care provider may recommend that you be screened regularly for cancer of the pelvic organs. These include your ovaries, uterus, and vagina. This screening involves a pelvic exam, which includes checking for microscopic changes to the surface of your cervix (Pap test).  For women ages 21-65, health care providers may recommend a pelvic exam and a Pap test every three years. For women ages 64-65, they may recommend the Pap test and pelvic exam, combined with testing for human papilloma virus (HPV), every five years. Some types of HPV increase your risk of cervical cancer. Testing for HPV may also be done on women of any age who have unclear Pap test results.  Other health care providers may not recommend any screening for nonpregnant women who are considered low risk for pelvic cancer and have no symptoms. Ask your health care provider if a screening pelvic exam is right for you.  If you have had past treatment for cervical cancer or a condition that could lead to cancer, you need  Pap tests and screening for cancer for at least 20 years after your treatment. If Pap tests have been discontinued for you, your risk factors (such as having a new sexual partner) need to be reassessed to determine if you should start having screenings again. Some women have medical problems that increase the chance of getting cervical cancer. In these cases, your health care provider may recommend that you have screening and Pap tests more often.  If you have a family history of uterine cancer or ovarian cancer, talk with your health care provider about genetic screening.  If you have vaginal bleeding after reaching menopause, tell your health care provider.  There are currently no reliable tests available to screen for ovarian cancer. Lung Cancer Lung cancer screening is recommended for adults 90-73 years old who are at high risk for lung cancer because of a history of smoking. A yearly low-dose CT scan of the lungs is recommended if you:  Currently smoke.  Have a history of at least 30 pack-years of smoking and you currently smoke or have quit within the past 15 years. A pack-year is smoking an average of one pack of cigarettes per day for one year. Yearly screening should:  Continue until it has been 15 years since you quit.  Stop if you develop a health problem  that would prevent you from having lung cancer treatment. Colorectal Cancer  This type of cancer can be detected and can often be prevented.  Routine colorectal cancer screening usually begins at age 67 and continues through age 78.  If you have risk factors for colon cancer, your health care provider may recommend that you be screened at an earlier age.  If you have a family history of colorectal cancer, talk with your health care provider about genetic screening.  Your health care provider may also recommend using home test kits to check for hidden blood in your stool.  A small camera at the end of a tube can be used to  examine your colon directly (sigmoidoscopy or colonoscopy). This is done to check for the earliest forms of colorectal cancer.  Direct examination of the colon should be repeated every 5-10 years until age 29. However, if early forms of precancerous polyps or small growths are found or if you have a family history or genetic risk for colorectal cancer, you may need to be screened more often. Skin Cancer  Check your skin from head to toe regularly.  Monitor any moles. Be sure to tell your health care provider:  About any new moles or changes in moles, especially if there is a change in a mole's shape or color.  If you have a mole that is larger than the size of a pencil eraser.  If any of your family members has a history of skin cancer, especially at a young age, talk with your health care provider about genetic screening.  Always use sunscreen. Apply sunscreen liberally and repeatedly throughout the day.  Whenever you are outside, protect yourself by wearing long sleeves, pants, a wide-brimmed hat, and sunglasses. WHAT SHOULD I KNOW ABOUT OSTEOPOROSIS? Osteoporosis is a condition in which bone destruction happens more quickly than new bone creation. After menopause, you may be at an increased risk for osteoporosis. To help prevent osteoporosis or the bone fractures that can happen because of osteoporosis, the following is recommended:  If you are 67-40 years old, get at least 1,000 mg of calcium and at least 600 mg of vitamin D per day.  If you are older than age 49 but younger than age 26, get at least 1,200 mg of calcium and at least 600 mg of vitamin D per day.  If you are older than age 78, get at least 1,200 mg of calcium and at least 800 mg of vitamin D per day. Smoking and excessive alcohol intake increase the risk of osteoporosis. Eat foods that are rich in calcium and vitamin D, and do weight-bearing exercises several times each week as directed by your health care provider. WHAT  SHOULD I KNOW ABOUT HOW MENOPAUSE AFFECTS [facility]? Depression may occur at any age, but it is more common as you become older. Common symptoms of depression include:  Low or sad mood.  Changes in sleep patterns.  Changes in appetite or eating patterns.  Feeling an overall lack of motivation or enjoyment of activities that you previously enjoyed.  Frequent crying spells. Talk with your health care provider if you think that you are experiencing depression. WHAT SHOULD I KNOW ABOUT IMMUNIZATIONS? It is important that you get and maintain your immunizations. These include:  Tetanus, diphtheria, and pertussis (Tdap) booster vaccine.  Influenza every year before the flu season begins.  Pneumonia vaccine.  Shingles vaccine. Your health care provider may also recommend other immunizations.   This information is  not intended to replace advice given to you by your health care provider. Make sure you discuss any questions you have with your health care provider.   Document Released: 03/27/2005 Document Revised: 02/23/2014 Document Reviewed: 10/05/2013 Elsevier Interactive Patient Education [employer].

## 2015-01-15 NOTE — Assessment & Plan Note (Signed)
Advised to try using cider vinegar given the nail discoloration with tea tree oil

## 2015-01-17 LAB — CYTOLOGY - PAP

## 2015-01-22 ENCOUNTER — Encounter: Payer: Self-pay | Admitting: Internal Medicine

## 2015-01-22 ENCOUNTER — Encounter: Payer: Self-pay | Admitting: Physician Assistant

## 2015-01-22 ENCOUNTER — Ambulatory Visit: Payer: Self-pay | Admitting: Physician Assistant

## 2015-01-22 VITALS — BP 110/76 | Temp 98.6°F

## 2015-01-22 DIAGNOSIS — Z299 Encounter for prophylactic measures, unspecified: Secondary | ICD-10-CM

## 2015-01-22 NOTE — Progress Notes (Signed)
Here for shingles vaccine with order from her pcp

## 2015-02-05 ENCOUNTER — Other Ambulatory Visit: Payer: Self-pay | Admitting: Internal Medicine

## 2015-02-05 DIAGNOSIS — Z1239 Encounter for other screening for malignant neoplasm of breast: Secondary | ICD-10-CM

## 2015-02-06 ENCOUNTER — Ambulatory Visit
Admission: RE | Admit: 2015-02-06 | Discharge: 2015-02-06 | Disposition: A | Discharge status: Home / Self Care | Payer: 59 | Source: Ambulatory Visit | Attending: Internal Medicine | Admitting: Internal Medicine

## 2015-02-06 DIAGNOSIS — Z1239 Encounter for other screening for malignant neoplasm of breast: Secondary | ICD-10-CM

## 2015-02-06 DIAGNOSIS — Z1231 Encounter for screening mammogram for malignant neoplasm of breast: Secondary | ICD-10-CM | POA: Insufficient documentation

## 2015-02-07 ENCOUNTER — Encounter: Payer: Self-pay | Admitting: Internal Medicine

## 2015-02-12 ENCOUNTER — Other Ambulatory Visit (INDEPENDENT_AMBULATORY_CARE_PROVIDER_SITE_OTHER): Payer: 59

## 2015-02-12 DIAGNOSIS — R5383 Other fatigue: Secondary | ICD-10-CM

## 2015-02-12 LAB — CBC WITH DIFFERENTIAL/PLATELET
BASOS PCT: 1.4 % (ref 0.0–3.0)
Basophils Absolute: 0.1 10*3/uL (ref 0.0–0.1)
EOS ABS: 0.2 10*3/uL (ref 0.0–0.7)
EOS PCT: 3.4 % (ref 0.0–5.0)
HEMATOCRIT: 37.9 % (ref 36.0–46.0)
HEMOGLOBIN: 12.7 g/dL (ref 12.0–15.0)
LYMPHS PCT: 28.8 % (ref 12.0–46.0)
Lymphs Abs: 1.3 10*3/uL (ref 0.7–4.0)
MCHC: 33.5 g/dL (ref 30.0–36.0)
MCV: 89 fl (ref 78.0–100.0)
MONOS PCT: 7.8 % (ref 3.0–12.0)
Monocytes Absolute: 0.3 10*3/uL (ref 0.1–1.0)
NEUTROS ABS: 2.6 10*3/uL (ref 1.4–7.7)
Neutrophils Relative %: 58.6 % (ref 43.0–77.0)
Platelets: 274 10*3/uL (ref 150.0–400.0)
RBC: 4.26 Mil/uL (ref 3.87–5.11)
RDW: 12.9 % (ref 11.5–15.5)
WBC: 4.4 10*3/uL (ref 4.0–10.5)

## 2015-02-12 LAB — LIPID PANEL
CHOL/HDL RATIO: 2
Cholesterol: 165 mg/dL (ref 0–200)
HDL: 69.3 mg/dL (ref >39.00)
LDL CALC: 83 mg/dL (ref 0–99)
NONHDL: 95.85
TRIGLYCERIDES: 64 mg/dL (ref 0.0–149.0)
VLDL: 12.8 mg/dL (ref 0.0–40.0)

## 2015-02-12 LAB — TSH: TSH: 1.32 u[IU]/mL (ref 0.35–4.50)

## 2015-02-13 ENCOUNTER — Encounter: Payer: Self-pay | Admitting: Internal Medicine

## 2015-02-13 LAB — HIV ANTIBODY (ROUTINE TESTING W REFLEX): HIV 1&2 Ab, 4th Generation: NONREACTIVE

## 2015-02-13 LAB — HEPATITIS C ANTIBODY: HCV Ab: NEGATIVE

## 2015-02-15 ENCOUNTER — Encounter: Payer: Self-pay | Admitting: Internal Medicine

## 2015-02-15 MED ORDER — POLYETHYLENE GLYCOL 3350 17 GM/SCOOP PO POWD: 17.0000 g | Freq: Two times a day (BID) | ORAL | Status: DC | PRN

## 2015-02-27 ENCOUNTER — Encounter: Payer: Self-pay | Admitting: Physician Assistant

## 2015-02-27 ENCOUNTER — Ambulatory Visit: Payer: Self-pay | Admitting: Physician Assistant

## 2015-02-27 VITALS — BP 100/60 | HR 80 | Temp 99.6°F

## 2015-02-27 DIAGNOSIS — J069 Acute upper respiratory infection, unspecified: Secondary | ICD-10-CM

## 2015-02-27 LAB — POCT INFLUENZA A/B
INFLUENZA A, POC: NEGATIVE
INFLUENZA B, POC: NEGATIVE

## 2015-02-27 MED ORDER — HYDROCOD POLST-CPM POLST ER 10-8 MG/5ML PO SUER: 5.0000 mL | Freq: Two times a day (BID) | ORAL | Status: DC | PRN

## 2015-02-27 NOTE — Progress Notes (Signed)
S: C/o runny nose and congestion for 1 days, + fever, chills, denies cp/sob, v/d; mucus is clear throughout the day, cough is sporadic, husband had same sx last week  Using otc meds: robitussin  O: PE: vitals with elevated temp, nad,  perrl eomi, normocephalic, tms dull, nasal mucosa red and swollen, throat injected, neck supple no lymph, lungs c t a, cv rrr, neuro intact, flu swab neg  A:  Acute viral uri   P: tussionex for cough, nr; tylenol or motrin for fever, recommend she not work until has not had fever in 24 hours; drink fluids, continue regular meds , use otc meds of choice, return if not improving in 5 days, return earlier if worsening

## 2015-03-14 ENCOUNTER — Other Ambulatory Visit: Payer: Self-pay

## 2015-04-11 ENCOUNTER — Encounter: Payer: Self-pay | Admitting: Physician Assistant

## 2015-04-11 ENCOUNTER — Ambulatory Visit: Payer: Self-pay | Admitting: Physician Assistant

## 2015-04-11 VITALS — BP 110/80 | Temp 101.7°F

## 2015-04-11 DIAGNOSIS — J09X2 Influenza due to identified novel influenza A virus with other respiratory manifestations: Secondary | ICD-10-CM

## 2015-04-11 LAB — POCT INFLUENZA A/B
INFLUENZA A, POC: POSITIVE — AB
INFLUENZA B, POC: NEGATIVE

## 2015-04-11 NOTE — Progress Notes (Signed)
S: C/o runny nose and congestion for 2 days, + fever, chills, ? Wheezing, denies cp/sob, v/d;  cough is sporadic, has body aches  Using otc meds: herbal cold med and mucinex, old rx for tussionex  O: PE: vitals wnl, nad, perrl eomi, normocephalic, tms dull, nasal mucosa red and swollen, throat injected, neck supple no lymph, lungs c t a, cv rrr, neuro intact, flu swab +A  A:  Acute influenza A  P: tamiflu  bid x 5d, continue tussionex; drink fluids, continue regular meds , use otc meds of choice, return if not improving in 5 days, return earlier if worsening

## 2015-09-17 DIAGNOSIS — H43812 Vitreous degeneration, left eye: Secondary | ICD-10-CM | POA: Diagnosis not present

## 2015-10-04 DIAGNOSIS — H43812 Vitreous degeneration, left eye: Secondary | ICD-10-CM | POA: Diagnosis not present

## 2015-11-11 DIAGNOSIS — H43812 Vitreous degeneration, left eye: Secondary | ICD-10-CM | POA: Diagnosis not present

## 2015-11-22 ENCOUNTER — Telehealth: Payer: Self-pay | Admitting: *Deleted

## 2015-11-22 MED ORDER — ESTROGENS, CONJUGATED 0.625 MG/GM VA CREA: TOPICAL_CREAM | VAGINAL | 0 refills | Status: DC

## 2015-11-22 NOTE — Telephone Encounter (Signed)
Pt requested a medication refill for premarin  Ph[facility]acy [facility]

## 2015-11-22 NOTE — Telephone Encounter (Signed)
Medication has been refilled.

## 2015-12-23 ENCOUNTER — Other Ambulatory Visit
Admission: RE | Admit: 2015-12-23 | Discharge: 2015-12-23 | Disposition: A | Discharge status: Home / Self Care | Payer: 59 | Source: Ambulatory Visit | Attending: Physician Assistant | Admitting: Physician Assistant

## 2015-12-23 ENCOUNTER — Ambulatory Visit: Payer: Self-pay | Admitting: Physician Assistant

## 2015-12-23 ENCOUNTER — Encounter: Payer: Self-pay | Admitting: Physician Assistant

## 2015-12-23 VITALS — BP 100/70 | HR 60 | Temp 98.7°F

## 2015-12-23 DIAGNOSIS — R21 Rash and other nonspecific skin eruption: Secondary | ICD-10-CM | POA: Insufficient documentation

## 2015-12-23 LAB — CBC WITH DIFFERENTIAL/PLATELET
Basophils Absolute: 0.2 10*3/uL — ABNORMAL HIGH (ref 0–0.1)
Basophils Relative: 3 %
EOS ABS: 0.2 10*3/uL (ref 0–0.7)
Eosinophils Relative: 5 %
HEMATOCRIT: 38.5 % (ref 35.0–47.0)
HEMOGLOBIN: 13.3 g/dL (ref 12.0–16.0)
LYMPHS ABS: 1.2 10*3/uL (ref 1.0–3.6)
LYMPHS PCT: 25 %
MCH: 30.8 pg (ref 26.0–34.0)
MCHC: 34.5 g/dL (ref 32.0–36.0)
MCV: 89.1 fL (ref 80.0–100.0)
MONOS PCT: 9 %
Monocytes Absolute: 0.4 10*3/uL (ref 0.2–0.9)
NEUTROS PCT: 58 %
Neutro Abs: 2.9 10*3/uL (ref 1.4–6.5)
Platelets: 243 10*3/uL (ref 150–440)
RBC: 4.32 MIL/uL (ref 3.80–5.20)
RDW: 12.9 % (ref 11.5–14.5)
WBC: 4.9 10*3/uL (ref 3.6–11.0)

## 2015-12-23 LAB — PROTIME-INR
INR: 0.97
PROTHROMBIN TIME: 12.9 s (ref 11.4–15.2)

## 2015-12-23 LAB — APTT: APTT: 33 s (ref 24–36)

## 2015-12-23 MED ORDER — DOXYCYCLINE HYCLATE 100 MG PO TABS: 100.0000 mg | ORAL_TABLET | Freq: Two times a day (BID) | ORAL | 0 refills | Status: DC

## 2015-12-23 NOTE — Progress Notes (Signed)
S: c/o rash on arms, legs, and trunk, not itching, started out around ankles and moved up, was in the woods when it started, no known tick bite, no fever/chills, no bleeding gums or bruising, did eat more seafood than she normally does, is allergic to iodine, no dif breathing, no sore throat  O: vitals wnl, nad, skin intact, + small petechial like rash on ankles wrists and forearms, hivelike areas on trunk, no vesicles, blisters, or drainage, n/v intact  A: rash  P: labs for cbc, rmsf igg and igm, lymes western blot, started pt on doxy 100mg  bid

## 2015-12-24 LAB — MISC LABCORP TEST (SEND OUT)
LABCORP TEST CODE: 16667
Labcorp test code: 16592

## 2015-12-25 ENCOUNTER — Encounter: Payer: Self-pay | Admitting: Physician Assistant

## 2015-12-25 ENCOUNTER — Ambulatory Visit: Payer: Self-pay | Admitting: Physician Assistant

## 2015-12-25 DIAGNOSIS — R21 Rash and other nonspecific skin eruption: Secondary | ICD-10-CM

## 2015-12-25 LAB — MISC LABCORP TEST (SEND OUT): LABCORP TEST CODE: 163600

## 2015-12-25 NOTE — Progress Notes (Signed)
S: would like to discuss lab results and rash again, states the area has gotten a little better, no itching, had some stinging in her scalp, sent pics of her rash to several people and they thinks she has poison ivy, once again states the areas do not itch, does not have any blisters on them  O: vitals wnl, nad, skin intact, red petechial type rash on forearms and ankles, rash at ankles a little more pronounced than 2 days ago, forearms appear better, no hives, no blisters, no vesicles, no fever/chills  A: rash  P: explained to pt that poison ivy would itch, explained to pt that her cbc ruled out any thing dangerous like itp or other cellular problems, clotting factors were normal, her rmsf tests were negative and her lymes test is negative, more than likely the rash is coming from all of the shell fish she ate since she is allergic to iodone, agree with her son that maybe zyrtec would be beneficial, finish doxy, will refer to dermatology if the problem persists

## 2015-12-25 NOTE — Progress Notes (Signed)
Not sure, it doesn't look like a contact dermatitis

## 2016-01-03 ENCOUNTER — Other Ambulatory Visit: Payer: Self-pay | Admitting: Internal Medicine

## 2016-01-03 DIAGNOSIS — Z1231 Encounter for screening mammogram for malignant neoplasm of breast: Secondary | ICD-10-CM

## 2016-01-17 ENCOUNTER — Ambulatory Visit (INDEPENDENT_AMBULATORY_CARE_PROVIDER_SITE_OTHER): Payer: 59 | Admitting: Internal Medicine

## 2016-01-17 ENCOUNTER — Encounter: Payer: Self-pay | Admitting: Internal Medicine

## 2016-01-17 VITALS — BP 102/68 | HR 71 | Temp 97.9°F | Resp 12 | Ht 64.0 in | Wt 129.5 lb

## 2016-01-17 DIAGNOSIS — M533 Sacrococcygeal disorders, not elsewhere classified: Secondary | ICD-10-CM | POA: Diagnosis not present

## 2016-01-17 DIAGNOSIS — Z8371 Family history of colonic polyps: Secondary | ICD-10-CM

## 2016-01-17 DIAGNOSIS — N6459 Other signs and symptoms in breast: Secondary | ICD-10-CM

## 2016-01-17 DIAGNOSIS — G8929 Other chronic pain: Secondary | ICD-10-CM

## 2016-01-17 DIAGNOSIS — Z0000 Encounter for general adult medical examination without abnormal findings: Secondary | ICD-10-CM

## 2016-01-17 DIAGNOSIS — E7800 Pure hypercholesterolemia, unspecified: Secondary | ICD-10-CM | POA: Diagnosis not present

## 2016-01-17 DIAGNOSIS — N952 Postmenopausal atrophic vaginitis: Secondary | ICD-10-CM | POA: Diagnosis not present

## 2016-01-17 DIAGNOSIS — F419 Anxiety disorder, unspecified: Secondary | ICD-10-CM

## 2016-01-17 DIAGNOSIS — E559 Vitamin D deficiency, unspecified: Secondary | ICD-10-CM

## 2016-01-17 DIAGNOSIS — F5105 Insomnia due to other mental disorder: Secondary | ICD-10-CM

## 2016-01-17 DIAGNOSIS — R5383 Other fatigue: Secondary | ICD-10-CM

## 2016-01-17 DIAGNOSIS — N6320 Unspecified lump in the left breast, unspecified quadrant: Secondary | ICD-10-CM | POA: Diagnosis not present

## 2016-01-17 NOTE — Progress Notes (Signed)
Pre-visit discussion using our clinic review tool. No additional management support is needed unless otherwise documented below in the visit note.  

## 2016-01-17 NOTE — Progress Notes (Signed)
Patient ID: Heather Watson, female    DOB: 02/18/1955  Age: 60 y.o. MRN: [id number]  The patient is here for annual  wellness examination and management of other chronic and acute problems.    Mammogram dec 28 Requesting pelvic exam PAP smear  Normal  2016 wants colonoscopy .early due to sister's recent finding of multiple polyps   wants to see  Midge Minium. Last one 2011 not available for review  The risk factors are reflected in the social history.  The roster of all physicians providing medical care to patient - is listed in the Snapshot section of the chart.   Home safety : The patient has smoke detectors in the home. They wear seatbelts.  There are no firearms at home. There is no violence in the home.   There is no risks for hepatitis, STDs or HIV. There is no   history of blood transfusion. They have no travel history to infectious disease endemic areas of the world.  The patient has seen their dentist in the last six month. They have seen their eye doctor in the last year.    Discussed the need for sun protection: hats, long sleeves and use of sunscreen if there is significant sun exposure.   Diet: the importance of a healthy diet is discussed. They do have a healthy diet.  The benefits of regular aerobic exercise were discussed. She exercises vigorously  4 times per week ,  Up to 60 minutes  Depression screen: there are no signs or vegative symptoms of depression- irritability, change in appetite, anhedonia, sadness/tearfullness.  The following portions of the patient's history were reviewed and updated as appropriate: allergies, current medications, past family history, past medical history,  past surgical history, past social history  and problem list.  Visual acuity was not assessed per patient preference since she has regular follow up with her ophthalmologist. Hearing and body mass index were assessed and reviewed.   During the course of the visit the patient was  educated and counseled about appropriate screening and preventive services including : fall prevention , diabetes screening, nutrition counseling, colorectal cancer screening, and recommended immunizations.    CC: The primary encounter diagnosis was Breast mass, left. Diagnoses of Vitamin D deficiency, Fatigue, unspecified type, Pure hypercholesterolemia, Encounter for preventive health examination, Insomnia secondary to anxiety, Family history of colonic polyps, Chronic right SI joint pain, Postmenopausal atrophic vaginitis, and Abnormal breast exam were also pertinent to this visit.  1) Hemorrhoids,  Bleed occasionally scant amounts.   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 Was given a pessary for bladder issues but has not been sing it for over a year  3)STRESS INCONTINENCE.  Finds that she must avoiding hydrating while  working out  4) Right posterior hip pain in the SI joint, occurs with exercise and with rolling over in bed .  Has tried turmeric.  Discussed seeing [facility]  5)Family history of colonic polyps.   Does not know if sister had diagnosis of FPP  Or just tubular adenoma.   History Heather Watson has a past medical history of Gallstones and UTI (urinary tract infection).   She has no past surgical history on file.   Her family history includes Arthritis in her mother; Breast cancer (age of onset: 64) in her maternal aunt; Breast cancer (age of onset: 56) in her mother; Cancer in her sister; Heart attack in  her maternal grandfather; Hypertension in her brother, brother, father, mother, and sister; Stroke in her father and paternal grandfather.She reports that she quit smoking about 34 years ago. Her smoking use included Cigarettes. She does not have any smokeless tobacco history on file. She reports that she drinks alcohol. She reports that she does not use drugs.  Outpatient Medications  Prior to Visit  Medication Sig Dispense Refill  . ALPRAZolam (XANAX) 0.25 MG tablet Take 1 tablet (0.25 mg total) by mouth at bedtime as needed for sleep. 30 tablet 2  . Cholecalciferol (VITAMIN D) 2000 UNITS tablet Take 2,000 Units by mouth daily. Reported on 02/27/2015    . conjugated estrogens (PREMARIN) vaginal cream INSERT 1 GRAM VAGINALLY AT NIGHT TWICE A WEEK. 30 g 0  . Omega 3-6-9 Fatty Acids LIQD 2.5 teaspoons daily.    . Ascorbic Acid (VITAMIN C) 1000 MG tablet Take 1,000 mg by mouth daily.    Marland Kitchen. BIOTIN PO Take 1 tablet by mouth daily.    . chlorpheniramine-HYDROcodone (TUSSIONEX PENNKINETIC ER) 10-8 MG/5ML SUER Take 5 mLs by mouth every 12 (twelve) hours as needed for cough. (Patient not taking: Reported on 12/23/2015) 150 mL 0  . doxycycline (VIBRA-TABS) 100 MG tablet Take 1 tablet (100 mg total) by mouth 2 (two) times daily. 20 tablet 0  . GLUCOSAMINE HCL PO Take 1 tablet by mouth daily.    . nitrofurantoin, macrocrystal-monohydrate, (MACROBID) 100 MG capsule Take 1 capsule (100 mg total) by mouth 2 (two) times daily. (Patient not taking: Reported on 12/23/2015) 14 capsule 3  . polyethylene glycol powder (GLYCOLAX/MIRALAX) powder Take 17 g by mouth 2 (two) times daily as needed. (Patient not taking: Reported on 12/23/2015) 3350 g 1  . zoster vaccine live, PF, (ZOSTAVAX) [id number] UNT/0.65ML injection Inject 19,400 Units into the skin once. (Patient not taking: Reported on 12/23/2015) 1 each 0   No facility-administered medications prior to visit.     Review of Systems   Patient denies headache, fevers, malaise, unintentional weight loss, skin rash, eye pain, sinus congestion and sinus pain, sore throat, dysphagia,  hemoptysis , cough, dyspnea, wheezing, chest pain, palpitations, orthopnea, edema, abdominal pain, nausea, melena, diarrhea, constipation, flank pain, dysuria, hematuria, urinary  Frequency, nocturia, numbness, tingling, seizures,  Focal weakness, Loss of consciousness,  Tremor,  depression, anxiety, and suicidal ideation.      Objective:  BP 102/68   Pulse 71   Temp 97.9 F (36.6 C) (Oral)   Resp 12   Ht 5\' 4"  (1.626 m)   Wt 129 lb 8 oz (58.7 kg)   LMP 04/12/2006   SpO2 99%   BMI 22.23 kg/m   Physical Exam  General Appearance:    Alert, cooperative, no distress, appears stated age  Head:    Normocephalic, without obvious abnormality, atraumatic  Eyes:    PERRL, conjunctiva/corneas clear, EOM's intact, fundi    benign, both eyes  Ears:    Normal TM's and external ear canals, both ears  Nose:   Nares normal, septum midline, mucosa normal, no drainage    or sinus tenderness  Throat:   Lips, mucosa, and tongue normal; teeth and gums normal  Neck:   Supple, symmetrical, trachea midline, no adenopathy;    thyroid:  no enlargement/tenderness/nodules; no carotid   bruit or JVD  Back:     Symmetric, no curvature, ROM normal, no CVA tenderness  Lungs:     Clear to auscultation bilaterally, respirations unlabored  Chest Wall:    No tenderness or  deformity   Heart:    Regular rate and rhythm, S1 and S2 normal, no murmur, rub   or gallop  Breast Exam:    No tenderness. Left breast notable for 2-3 mm nodules at 9:00 position at the border of the areola.  right breast without masses or nipple changes  Abdomen:     Soft, non-tender, bowel sounds active all four quadrants,    no masses, no organomegaly  Genitalia:    Pelvic: cervix normal in appearance, external genitalia normal, no adnexal masses or tenderness, no cervical motion tenderness, rectovaginal septum normal, uterus normal size, shape, and consistency and vagina stenotic without discharge  Extremities:   Extremities normal, atraumatic, no cyanosis or edema  Pulses:   2+ and symmetric all extremities  Skin:   Skin color, texture, turgor normal, no rashes or lesions  Lymph nodes:   Cervical, supraclavicular, and axillary nodes normal  Neurologic:   CNII-XII intact, normal strength, sensation and reflexes     throughout   Assessment & Plan:   Problem List Items Addressed This Visit    Abnormal breast exam    I found a small (2 mm ) lump in her left breast at the edge of her areola at the 9:00 position. diagnostic mammogram ordered        Chronic right SI joint pain    Recommend NSAID, stretching.       Encounter for preventive health examination    Annual comprehensive preventive exam was done as well as an evaluation and management of chronic conditions .  During the course of the visit the patient was educated and counseled about appropriate screening and preventive services including :  diabetes screening, lipid analysis with projected  10 year  risk for CAD , nutrition counseling, breast, cervical and colorectal cancer screening, and recommended immunizations.  Printed recommendations for health maintenance screenings was give      Family history of colonic polyps    Unclear if sister's diagnosis is FPP.  Patient's  colonoscopy was 2011.  Will [facility] request to [facility].      Relevant Orders   Ambulatory referral to Gastroenterology   Insomnia secondary to anxiety    Managed with prn alprazolam .The risks and benefits of benzodiazepine use were reviewed with patient today including excessive sedation leading to respiratory depression,  impaired thinking/driving, and addiction.  Patient was advised to avoid concurrent use with alcohol, to use medication only as needed and not to share with others  .       Postmenopausal atrophic vaginitis    Her dryness has resolved with use of Premarin.  She is not interested in referral for dilators.  Recommend trial of pessary use.        Other Visit Diagnoses    Breast mass, left    -  Primary   Relevant Orders   MM Digital Diagnostic Bilat   Vitamin D deficiency       Relevant Orders   VITAMIN D 25 Hydroxy (Vit-D Deficiency, Fractures)   Fatigue, unspecified type       Relevant Orders   Comprehensive metabolic panel   TSH   CBC  with Differential/Platelet   Pure hypercholesterolemia       Relevant Orders   Lipid panel      I have discontinued Ms. Philbrick's GLUCOSAMINE HCL PO, vitamin C, BIOTIN PO, nitrofurantoin (macrocrystal-monohydrate), zoster vaccine live (PF), polyethylene glycol powder, chlorpheniramine-HYDROcodone, and doxycycline. I am also having her maintain her Omega 3-6-9  Fatty Acids, Vitamin D, ALPRAZolam, conjugated estrogens, magnesium gluconate, and calcium gluconate.  Meds ordered this encounter  Medications  . magnesium gluconate (MAGONATE) 500 MG tablet    Sig: Take 800 mg by mouth daily.  . calcium gluconate 500 MG tablet    Sig: Take 1 tablet by mouth daily.    Medications Discontinued During This Encounter  Medication Reason  . zoster vaccine live, PF, (ZOSTAVAX) 16109 UNT/0.65ML injection Change in therapy  . polyethylene glycol powder (GLYCOLAX/MIRALAX) powder Change in therapy  . GLUCOSAMINE HCL PO Change in therapy  . nitrofurantoin, macrocrystal-monohydrate, (MACROBID) 100 MG capsule Change in therapy  . doxycycline (VIBRA-TABS) 100 MG tablet Change in therapy  . chlorpheniramine-HYDROcodone (TUSSIONEX PENNKINETIC ER) 10-8 MG/5ML SUER Change in therapy  . Ascorbic Acid (VITAMIN C) 1000 MG tablet Change in therapy  . BIOTIN PO Change in therapy    Follow-up: No Follow-up on file.   Sherlene Shams, MD

## 2016-01-17 NOTE — Patient Instructions (Signed)
I am ordering a diagnostic mammogram of the left breast based on today's exam.    [facility]

## 2016-01-19 DIAGNOSIS — N6459 Other signs and symptoms in breast: Secondary | ICD-10-CM | POA: Insufficient documentation

## 2016-01-19 DIAGNOSIS — Z8371 Family history of colonic polyps: Secondary | ICD-10-CM | POA: Insufficient documentation

## 2016-01-19 DIAGNOSIS — G8929 Other chronic pain: Secondary | ICD-10-CM | POA: Insufficient documentation

## 2016-01-19 DIAGNOSIS — M533 Sacrococcygeal disorders, not elsewhere classified: Secondary | ICD-10-CM

## 2016-01-19 NOTE — Assessment & Plan Note (Signed)
Unclear if sister's diagnosis is FPP.  Patient's  colonoscopy was 2011.  Will refer per request to [facility].

## 2016-01-19 NOTE — Assessment & Plan Note (Signed)
I found a small (2 mm ) lump in her left breast at the edge of her areola at the 9:00 position. diagnostic mammogram ordered

## 2016-01-19 NOTE — Assessment & Plan Note (Signed)
Recommend NSAID, stretching.

## 2016-01-19 NOTE — Assessment & Plan Note (Signed)
Managed with prn alprazolam . The risks and benefits of benzodiazepine use were reviewed  with patient today including excessive sedation leading to respiratory depression,  impaired thinking/driving, and addiction.  Patient was advised to avoid concurrent use with alcohol, to use medication only as needed and not to share with others  .  

## 2016-01-19 NOTE — Assessment & Plan Note (Signed)
Her dryness has resolved with use of Premarin.  She is not interested in referral for dilators.  Recommend trial of pessary use.

## 2016-01-19 NOTE — Assessment & Plan Note (Signed)
Annual comprehensive preventive exam was done as well as an evaluation and management of chronic conditions .  During the course of the visit the patient was educated and counseled about appropriate screening and preventive services including :  diabetes screening, lipid analysis with projected  10 year  risk for CAD , nutrition counseling, breast, cervical and colorectal cancer screening, and recommended immunizations.  Printed recommendations for health maintenance screenings was give 

## 2016-01-20 ENCOUNTER — Other Ambulatory Visit: Payer: Self-pay | Admitting: Internal Medicine

## 2016-01-20 DIAGNOSIS — N6320 Unspecified lump in the left breast, unspecified quadrant: Secondary | ICD-10-CM

## 2016-01-21 ENCOUNTER — Ambulatory Visit
Admission: RE | Admit: 2016-01-21 | Discharge: 2016-01-21 | Disposition: A | Discharge status: Home / Self Care | Payer: 59 | Source: Ambulatory Visit | Attending: Internal Medicine | Admitting: Internal Medicine

## 2016-01-21 DIAGNOSIS — N6320 Unspecified lump in the left breast, unspecified quadrant: Secondary | ICD-10-CM | POA: Diagnosis not present

## 2016-01-21 DIAGNOSIS — N6489 Other specified disorders of breast: Secondary | ICD-10-CM | POA: Diagnosis not present

## 2016-01-21 DIAGNOSIS — R922 Inconclusive mammogram: Secondary | ICD-10-CM | POA: Diagnosis not present

## 2016-01-30 ENCOUNTER — Other Ambulatory Visit: Payer: Self-pay

## 2016-01-30 ENCOUNTER — Ambulatory Visit: Payer: 59

## 2016-02-03 ENCOUNTER — Other Ambulatory Visit: Payer: Self-pay

## 2016-02-03 ENCOUNTER — Ambulatory Visit: Payer: 59

## 2016-02-11 ENCOUNTER — Telehealth: Payer: Self-pay | Admitting: Internal Medicine

## 2016-02-11 NOTE — Telephone Encounter (Signed)
Noted  

## 2016-02-11 NOTE — Telephone Encounter (Signed)
Pt was scheduled and had a Mammo done on 01/21/16. Per order that was needed. Thank you!

## 2016-02-13 ENCOUNTER — Ambulatory Visit: Payer: 59

## 2016-03-10 ENCOUNTER — Ambulatory Visit: Payer: Self-pay | Admitting: Physician Assistant

## 2016-03-10 VITALS — BP 104/72 | HR 80 | Temp 98.4°F

## 2016-03-10 DIAGNOSIS — R300 Dysuria: Secondary | ICD-10-CM

## 2016-03-10 LAB — POCT URINALYSIS DIPSTICK
Bilirubin, UA: NEGATIVE
Glucose, UA: NEGATIVE
Ketones, UA: NEGATIVE
Nitrite, UA: POSITIVE
PROTEIN UA: NEGATIVE
Spec Grav, UA: 1.015
Urobilinogen, UA: 0.2
pH, UA: 8.5

## 2016-03-10 NOTE — Progress Notes (Signed)
S:  Urinary freq starting at 7pm last night.  Came back from [place] where she drank a lot of coffee.  Took Azo for the sx.  No fever.  Has some after voiding burning sensation.  O: Urine with leu trace and Nitrate pos  A:  Bladder spasms P:  Continue AZO and increase fluids

## 2016-04-02 ENCOUNTER — Encounter: Payer: Self-pay | Admitting: Internal Medicine

## 2016-04-02 NOTE — Telephone Encounter (Signed)
Per chart patient is not due until 2021 with last colonoscopy reported in 2011.

## 2016-04-13 ENCOUNTER — Telehealth: Payer: Self-pay | Admitting: Gastroenterology

## 2016-04-13 NOTE — Telephone Encounter (Signed)
[facility]

## 2016-04-28 ENCOUNTER — Encounter: Payer: Self-pay | Admitting: Internal Medicine

## 2016-04-29 ENCOUNTER — Other Ambulatory Visit: Payer: Self-pay | Admitting: Internal Medicine

## 2016-04-29 MED ORDER — MELOXICAM 7.5 MG PO TABS: 7.5000 mg | ORAL_TABLET | Freq: Every day | ORAL | 3 refills | Status: DC

## 2016-05-01 ENCOUNTER — Other Ambulatory Visit: Payer: Self-pay

## 2016-05-01 ENCOUNTER — Telehealth: Payer: Self-pay

## 2016-05-01 DIAGNOSIS — Z1211 Encounter for screening for malignant neoplasm of colon: Secondary | ICD-10-CM

## 2016-05-01 NOTE — Telephone Encounter (Signed)
Gastroenterology Pre-Procedure Review  Request Date:  Requesting Physician: Dr.   PATIENT REVIEW QUESTIONS: The patient responded to the following health history questions as indicated:    1. Are you having any GI issues? no 2. Do you have a personal history of Polyps? no 3. Do you have a family history of Colon Cancer or Polyps? no 4. Diabetes Mellitus? no 5. Joint replacements in the past 12 months?no 6. Major health problems in the past 3 months?no 7. Any artificial heart valves, MVP, or defibrillator?no    MEDICATIONS & ALLERGIES:    Patient reports the following regarding taking any anticoagulation/antiplatelet therapy:   Plavix, Coumadin, Eliquis, Xarelto, Lovenox, Pradaxa, Brilinta, or Effient? no Aspirin? no  Patient confirms/reports the following medications:  Current Outpatient Prescriptions  Medication Sig Dispense Refill  . ALPRAZolam (XANAX) 0.25 MG tablet Take 1 tablet (0.25 mg total) by mouth at bedtime as needed for sleep. 30 tablet 2  . calcium gluconate 500 MG tablet Take 1 tablet by mouth daily.    . Cholecalciferol (VITAMIN D) 2000 UNITS tablet Take 2,000 Units by mouth daily. Reported on 02/27/2015    . conjugated estrogens (PREMARIN) vaginal cream INSERT 1 GRAM VAGINALLY AT NIGHT TWICE A WEEK. 30 g 0  . magnesium gluconate (MAGONATE) 500 MG tablet Take 800 mg by mouth daily.    . meloxicam (MOBIC) 7.5 MG tablet Take 1 tablet (7.5 mg total) by mouth daily. As needed for joint pain 30 tablet 3  . Omega 3-6-9 Fatty Acids LIQD 2.5 teaspoons daily.     No current facility-administered medications for this visit.     Patient confirms/reports the following allergies:  Allergies  Allergen Reactions  . Iodine Rash and Itching    Contrast through IV    No orders of the defined types were placed in this encounter.   AUTHORIZATION INFORMATION Primary Insurance: 1D#: Group #:  Secondary Insurance: 1D#: Group #:  SCHEDULE INFORMATION: Date:  05/19/16 Time: Location: [facility]

## 2016-05-01 NOTE — Telephone Encounter (Signed)
[facility]. Instructs/rx mailed.   Precert for screening Z12.11

## 2016-05-15 ENCOUNTER — Encounter: Payer: Self-pay | Admitting: Internal Medicine

## 2016-05-19 ENCOUNTER — Ambulatory Visit: Payer: 59 | Admitting: Anesthesiology

## 2016-05-19 ENCOUNTER — Ambulatory Visit
Admission: RE | Admit: 2016-05-19 | Discharge: 2016-05-19 | Disposition: A | Discharge status: Home / Self Care | Payer: 59 | Source: Ambulatory Visit | Attending: Gastroenterology | Admitting: Gastroenterology

## 2016-05-19 ENCOUNTER — Encounter: Payer: Self-pay | Admitting: *Deleted

## 2016-05-19 ENCOUNTER — Encounter
Admission: RE | Disposition: A | Discharge status: Home / Self Care | Payer: Self-pay | Source: Ambulatory Visit | Attending: Gastroenterology

## 2016-05-19 DIAGNOSIS — Z87891 Personal history of nicotine dependence: Secondary | ICD-10-CM | POA: Diagnosis not present

## 2016-05-19 DIAGNOSIS — Z1211 Encounter for screening for malignant neoplasm of colon: Secondary | ICD-10-CM | POA: Diagnosis not present

## 2016-05-19 DIAGNOSIS — K640 First degree hemorrhoids: Secondary | ICD-10-CM | POA: Diagnosis not present

## 2016-05-19 DIAGNOSIS — Z79899 Other long term (current) drug therapy: Secondary | ICD-10-CM | POA: Diagnosis not present

## 2016-05-19 DIAGNOSIS — K648 Other hemorrhoids: Secondary | ICD-10-CM | POA: Diagnosis not present

## 2016-05-19 HISTORY — PX: COLONOSCOPY WITH PROPOFOL: SHX5780

## 2016-05-19 SURGERY — COLONOSCOPY WITH PROPOFOL
Anesthesia: General

## 2016-05-19 MED ORDER — LIDOCAINE HCL (CARDIAC) 20 MG/ML IV SOLN
INTRAVENOUS | Status: DC | PRN
  Administered 2016-05-19: 80 mg via INTRAVENOUS

## 2016-05-19 MED ORDER — PROPOFOL 10 MG/ML IV BOLUS
INTRAVENOUS | Status: DC | PRN
  Administered 2016-05-19: 40 mg via INTRAVENOUS
  Administered 2016-05-19: 60 mg via INTRAVENOUS

## 2016-05-19 MED ORDER — PROPOFOL 500 MG/50ML IV EMUL
INTRAVENOUS | Status: DC | PRN
  Administered 2016-05-19: 125 ug/kg/min via INTRAVENOUS

## 2016-05-19 MED ORDER — SODIUM CHLORIDE 0.9 % IV SOLN
INTRAVENOUS | Status: DC
  Administered 2016-05-19: 08:00:00 via INTRAVENOUS

## 2016-05-19 MED ORDER — PROPOFOL 500 MG/50ML IV EMUL
INTRAVENOUS | Status: AC
  Filled 2016-05-19: qty 50

## 2016-05-19 MED ORDER — LIDOCAINE HCL (PF) 2 % IJ SOLN
INTRAMUSCULAR | Status: AC
  Filled 2016-05-19: qty 2

## 2016-05-19 NOTE — Transfer of Care (Signed)
Immediate Anesthesia Transfer of Care Note  Patient: Heather Watson  Procedure(s) Performed: Procedure(s): COLONOSCOPY WITH PROPOFOL (N/A)  Patient Location: Endoscopy Unit  Anesthesia Type:General  Level of Consciousness: sedated  Airway & Oxygen Therapy: Patient Spontanous Breathing and Patient connected to nasal cannula oxygen  Post-op Assessment: Report given to RN and Post -op Vital signs reviewed and stable  Post vital signs: Reviewed and stable  Last Vitals:  Vitals:   05/19/16 0713  BP: 113/68  Pulse: 73  Resp: 16  Temp: 36.3 C    Last Pain:  Vitals:   05/19/16 0713  TempSrc: Tympanic         Complications: No apparent anesthesia complications

## 2016-05-19 NOTE — Anesthesia Preprocedure Evaluation (Signed)
Anesthesia Evaluation  Patient identified by MRN, date of birth, ID band Patient awake    Reviewed: Allergy & Precautions, H&P , NPO status , Patient's Chart, lab work & pertinent test results, reviewed documented beta blocker date and time   History of Anesthesia Complications Negative for: history of anesthetic complications  Airway Mallampati: III  TM Distance: >3 FB Neck ROM: full    Dental  (+) Caps   Pulmonary neg pulmonary ROS, former smoker,           Cardiovascular Exercise Tolerance: Good negative cardio ROS       Neuro/Psych negative neurological ROS  negative psych ROS   GI/Hepatic negative GI ROS, Neg liver ROS,   Endo/Other  negative endocrine ROS  Renal/GU negative Renal ROS  negative genitourinary   Musculoskeletal   Abdominal   Peds  Hematology negative hematology ROS (+)   Anesthesia Other Findings Past Medical History: No date: Gallstones No date: UTI (urinary tract infection)   Reproductive/Obstetrics negative OB ROS                             Anesthesia Physical Anesthesia Plan  ASA: I  Anesthesia Plan: General   Post-op Pain Management:    Induction:   Airway Management Planned:   Additional Equipment:   Intra-op Plan:   Post-operative Plan:   Informed Consent: I have reviewed the patients History and Physical, chart, labs and discussed the procedure including the risks, benefits and alternatives for the proposed anesthesia with the patient or authorized representative who has indicated his/her understanding and acceptance.   Dental Advisory Given  Plan Discussed with: Anesthesiologist, CRNA and Surgeon  Anesthesia Plan Comments:         Anesthesia Quick Evaluation

## 2016-05-19 NOTE — Anesthesia Post-op Follow-up Note (Signed)
Anesthesia QCDR form completed.        

## 2016-05-19 NOTE — H&P (Signed)
Ahsan Esterline, MD [facility] [address], [address] [place], [place] [address] Phone: [phone] Fax : [phone]  Primary Care Physician:  Sherlene Shams, MD Primary Gastroenterologist:  Dr. Servando Snare  Pre-Procedure History & Physical: HPI:  Heather Watson is a 61 y.o. female is here for a screening colonoscopy.   Past Medical History:  Diagnosis Date  . Gallstones   . UTI (urinary tract infection)     History reviewed. No pertinent surgical history.  Prior to Admission medications   Medication Sig Start Date End Date Taking? Authorizing Provider  ALPRAZolam Prudy Feeler) 0.25 MG tablet Take 1 tablet (0.25 mg total) by mouth at bedtime as needed for sleep. 02/02/14  Yes Sherlene Shams, MD  calcium gluconate 500 MG tablet Take 1 tablet by mouth daily.   Yes Historical Provider, MD  Cholecalciferol (VITAMIN D) 2000 UNITS tablet Take 2,000 Units by mouth daily. Reported on 02/27/2015   Yes Historical Provider, MD  magnesium gluconate (MAGONATE) 500 MG tablet Take 800 mg by mouth daily.   Yes Historical Provider, MD  meloxicam (MOBIC) 7.5 MG tablet Take 1 tablet (7.5 mg total) by mouth daily. As needed for joint pain 04/29/16  Yes Sherlene Shams, MD  Omega 3-6-9 Fatty Acids LIQD 2.5 teaspoons daily.   Yes Historical Provider, MD  conjugated estrogens (PREMARIN) vaginal cream INSERT 1 GRAM VAGINALLY AT NIGHT TWICE A WEEK. Patient not taking: Reported on 05/19/2016 11/22/15   Sherlene Shams, MD    Allergies as of 05/01/2016 - Review Complete 03/10/2016  Allergen Reaction Noted  . Iodine Rash and Itching 05/12/2012    Family History  Problem Relation Age of Onset  . Hypertension Mother   . Arthritis Mother     osteoarthritis  . Breast cancer Mother 3  . Stroke Father   . Hypertension Father   . Cancer Sister     lymphoma  . Hypertension Sister   . Hypertension Brother   . Heart attack Maternal Grandfather   . Stroke Paternal Grandfather   . Hypertension Brother   . Breast cancer  Maternal Aunt 27    Social History   Social History  . Marital status: Married    Spouse name: N/A  . Number of children: N/A  . Years of education: N/A   Occupational History  . Not on file.   Social History Main Topics  . Smoking status: Former Smoker    Types: Cigarettes    Quit date: 08/21/1981  . Smokeless tobacco: Never Used  . Alcohol use Yes  . Drug use: No  . Sexual activity: Yes    Birth control/ protection: Post-menopausal   Other Topics Concern  . Not on file   Social History Narrative  . No narrative on file    Review of Systems: See HPI, otherwise negative ROS  Physical Exam: BP 113/68   Pulse 73   Temp 97.3 F (36.3 C) (Tympanic)   Resp 16   Ht  (1.651 m)   Wt 126 lb (57.2 kg)   LMP 04/12/2006   SpO2 100%   BMI 20.97 kg/m  General:   Alert,  pleasant and cooperative in NAD Head:  Normocephalic and atraumatic. Neck:  Supple; no masses or thyromegaly. Lungs:  Clear throughout to auscultation.    Heart:  Regular rate and rhythm. Abdomen:  Soft, nontender and nondistended. Normal bowel sounds, without guarding, and without rebound.   Neurologic:  Alert and  oriented x4;  grossly normal neurologicalMidge Miniumression/Plan: Kandis Cocking  C Moro is now here to undergo a screening colonoscopy.  Risks, benefits, and alternatives regarding colonoscopy have been reviewed with the patient.  Questions have been answered.  All parties agreeable.

## 2016-05-19 NOTE — Op Note (Signed)
[facility]astroenterology Patient Name: Heather Watson Procedure Date: 05/19/2016 8:06 AM MRN: [id number] Account #: [id number] Date of Birth: 1956-02-01 Admit Type: Outpatient Age: 61 Room: [facility] ENDO ROOM 4 Gender: Female Note Status: Finalized Procedure:            Colonoscopy Indications:          Screening for colorectal malignant neoplasm Providers:            Midge Minium MD, MD Referring MD:         Duncan Dull, MD (Referring MD) Medicines:            Propofol per Anesthesia Complications:        No immediate complications. Procedure:            Pre-Anesthesia Assessment:                       - Prior to the procedure, a History and Physical was                        performed, and patient medications and allergies were                        reviewed. The patient's tolerance of previous                        anesthesia was also reviewed. The risks and benefits of                        the procedure and the sedation options and risks were                        discussed with the patient. All questions were                        answered, and informed consent was obtained. Prior                        Anticoagulants: The patient has taken no previous                        anticoagulant or antiplatelet agents. ASA Grade                        Assessment: II - A patient with mild systemic disease.                        After reviewing the risks and benefits, the patient was                        deemed in satisfactory condition to undergo the                        procedure.                       After obtaining informed consent, the colonoscope was                        passed under direct vision. Throughout the procedure,  the patient's blood pressure, pulse, and oxygen                        saturations were monitored continuously. The                        Colonoscope was introduced through the anus and           advanced to the the cecum, identified by appendiceal                        orifice and ileocecal valve. The colonoscopy was                        performed without difficulty. The patient tolerated the                        procedure well. The quality of the bowel preparation                        was excellent. Findings:      The perianal and digital rectal examinations were normal.      Non-bleeding internal hemorrhoids were found during retroflexion. The       hemorrhoids were Grade I (internal hemorrhoids that do not prolapse). Impression:           - Non-bleeding internal hemorrhoids.                       - No specimens collected. Recommendation:       - Discharge patient to home.                       - Resume previous diet.                       - Continue present medications.                       - Repeat colonoscopy in 5 years for surveillance. Procedure Code(s):    --- Professional ---                       [phone], Colonoscopy, flexible; diagnostic, including                        collection of specimen(s) by brushing or washing, when                        performed (separate procedure) Diagnosis Code(s):    --- Professional ---                       Z12.11, Encounter for screening for malignant neoplasm                        of colon CPT copyright 2016 American Medical Association. All rights reserved. The codes documented in this report are preliminary and upon coder review may  be revised to meet current compliance requirements. Midge Minium MD, MD 05/19/2016 8:38:19 AM This report has been signed electronically. Number of Addenda: 0 Note Initiated On: 05/19/2016 8:06 AM Scope Withdrawal Time: 0 hours 9 minutes 4 seconds  Total Procedure Duration: 0 hours 22  minutes 11 seconds       [facility]

## 2016-05-19 NOTE — Anesthesia Postprocedure Evaluation (Signed)
Anesthesia Post Note  Patient: Heather Watson  Procedure(s) Performed: Procedure(s) (LRB): COLONOSCOPY WITH PROPOFOL (N/A)  Patient location during evaluation: Endoscopy Anesthesia Type: General Level of consciousness: awake and alert Pain management: pain level controlled Vital Signs Assessment: post-procedure vital signs reviewed and stable Respiratory status: spontaneous breathing, nonlabored ventilation, respiratory function stable and patient connected to nasal cannula oxygen Cardiovascular status: blood pressure returned to baseline and stable Postop Assessment: no signs of nausea or vomiting Anesthetic complications: no     Last Vitals:  Vitals:   05/19/16 0850 05/19/16 0900  BP: 98/60 94/64  Pulse: 62 60  Resp: 14 20  Temp:      Last Pain:  Vitals:   05/19/16 0900  TempSrc:   PainSc: 0-No pain                 Lenard Simmer

## 2016-05-20 ENCOUNTER — Encounter: Payer: Self-pay | Admitting: Gastroenterology

## 2016-08-06 ENCOUNTER — Ambulatory Visit: Payer: Self-pay | Attending: Internal Medicine | Admitting: Occupational Therapy

## 2016-08-06 DIAGNOSIS — G5601 Carpal tunnel syndrome, right upper limb: Secondary | ICD-10-CM | POA: Insufficient documentation

## 2016-08-06 NOTE — Therapy (Signed)
[facility] [address] [place], [place], [address] Phone: [phone]   Fax:  [phone]    SCREEN   Patient Details  Name: Heather BogaMaritza C Watson MRN: [id number] Date of Birth: 1955-08-14 Referring Provider:  No ref. provider found  Encounter Date: 08/06/2016   Pt arrive for screen for R hand CTS - pt had in past and was seen by this OT in the past   Pt report she is doing for the last 4-5 months yoga, and body toning classes - that require a lot of weight bearing thru palms, or knuckles of bilateral hands.  Pt report numbness in middle finger and ring finger since then.  Discuss with pt that need to modify position for planks to forearm - and discuss with trainers. Pt can wear Soft benik wrist splint during day at work and other act  And fit pt with  Prefab hard wrist splint  for night time -  Pt to do contrast , tendon glides and Med N glides 2 x day for 3 wks.  See if she is better -otherwise contact me    Oletta CohnDuPreez, Coye Dawood OTR/L,CLT 08/06/2016, 6:07 PM  [facility] [address] [place], [place], [address] Phone: [phone]   Fax:  [phone]

## 2016-11-05 ENCOUNTER — Telehealth: Payer: Self-pay | Admitting: Internal Medicine

## 2016-11-05 DIAGNOSIS — Z79899 Other long term (current) drug therapy: Secondary | ICD-10-CM

## 2016-11-05 DIAGNOSIS — E785 Hyperlipidemia, unspecified: Secondary | ICD-10-CM

## 2016-11-05 DIAGNOSIS — R5383 Other fatigue: Secondary | ICD-10-CM

## 2016-11-05 NOTE — Telephone Encounter (Signed)
Pt called and is requesting to have labs done before her physical on 12/3. Please advise, thank you!  Call pt @ [phone]

## 2016-11-09 NOTE — Telephone Encounter (Signed)
LMTCB. Need to schedule pt a lab appt before he physical in December.   Labs have been ordered.

## 2016-11-11 NOTE — Telephone Encounter (Signed)
Error

## 2016-12-08 ENCOUNTER — Other Ambulatory Visit: Payer: Self-pay | Admitting: Internal Medicine

## 2016-12-08 DIAGNOSIS — Z1231 Encounter for screening mammogram for malignant neoplasm of breast: Secondary | ICD-10-CM

## 2016-12-21 DIAGNOSIS — H524 Presbyopia: Secondary | ICD-10-CM | POA: Diagnosis not present

## 2017-01-15 ENCOUNTER — Other Ambulatory Visit (INDEPENDENT_AMBULATORY_CARE_PROVIDER_SITE_OTHER): Payer: 59

## 2017-01-15 DIAGNOSIS — E785 Hyperlipidemia, unspecified: Secondary | ICD-10-CM | POA: Diagnosis not present

## 2017-01-15 DIAGNOSIS — R5383 Other fatigue: Secondary | ICD-10-CM

## 2017-01-15 DIAGNOSIS — Z79899 Other long term (current) drug therapy: Secondary | ICD-10-CM | POA: Diagnosis not present

## 2017-01-15 LAB — LIPID PANEL
CHOL/HDL RATIO: 2
CHOLESTEROL: 160 mg/dL (ref 0–200)
HDL: 72.4 mg/dL (ref >39.00)
LDL CALC: 80 mg/dL (ref 0–99)
NONHDL: 88.05
Triglycerides: 42 mg/dL (ref 0.0–149.0)
VLDL: 8.4 mg/dL (ref 0.0–40.0)

## 2017-01-15 LAB — CBC WITH DIFFERENTIAL/PLATELET
Basophils Absolute: 0.1 10*3/uL (ref 0.0–0.1)
Basophils Relative: 1.8 % (ref 0.0–3.0)
EOS ABS: 0.1 10*3/uL (ref 0.0–0.7)
EOS PCT: 1.8 % (ref 0.0–5.0)
HEMATOCRIT: 40.8 % (ref 36.0–46.0)
Hemoglobin: 13.5 g/dL (ref 12.0–15.0)
LYMPHS PCT: 32.7 % (ref 12.0–46.0)
Lymphs Abs: 1.8 10*3/uL (ref 0.7–4.0)
MCHC: 33.1 g/dL (ref 30.0–36.0)
MCV: 91.9 fl (ref 78.0–100.0)
Monocytes Absolute: 0.4 10*3/uL (ref 0.1–1.0)
Monocytes Relative: 7.5 % (ref 3.0–12.0)
NEUTROS ABS: 3.1 10*3/uL (ref 1.4–7.7)
Neutrophils Relative %: 56.2 % (ref 43.0–77.0)
PLATELETS: 292 10*3/uL (ref 150.0–400.0)
RBC: 4.44 Mil/uL (ref 3.87–5.11)
RDW: 12.8 % (ref 11.5–15.5)
WBC: 5.6 10*3/uL (ref 4.0–10.5)

## 2017-01-15 LAB — COMPREHENSIVE METABOLIC PANEL
ALT: 16 U/L (ref 0–35)
AST: 20 U/L (ref 0–37)
Albumin: 4.3 g/dL (ref 3.5–5.2)
Alkaline Phosphatase: 69 U/L (ref 39–117)
BILIRUBIN TOTAL: 0.6 mg/dL (ref 0.2–1.2)
BUN: 17 mg/dL (ref 6–23)
CHLORIDE: 103 meq/L (ref 96–112)
CO2: 29 meq/L (ref 19–32)
CREATININE: 0.65 mg/dL (ref 0.40–1.20)
Calcium: 9.4 mg/dL (ref 8.4–10.5)
GFR: 98.49 mL/min (ref >60.00)
GLUCOSE: 97 mg/dL (ref 70–99)
Potassium: 3.9 mEq/L (ref 3.5–5.1)
Sodium: 139 mEq/L (ref 135–145)
Total Protein: 7 g/dL (ref 6.0–8.3)

## 2017-01-15 LAB — VITAMIN D 25 HYDROXY (VIT D DEFICIENCY, FRACTURES): VITD: 15.97 ng/mL — AB (ref 30.00–100.00)

## 2017-01-15 LAB — LDL CHOLESTEROL, DIRECT: Direct LDL: 69 mg/dL

## 2017-01-15 LAB — TSH: TSH: 1.32 u[IU]/mL (ref 0.35–4.50)

## 2017-01-17 ENCOUNTER — Encounter: Payer: Self-pay | Admitting: Internal Medicine

## 2017-01-17 ENCOUNTER — Other Ambulatory Visit: Payer: Self-pay | Admitting: Internal Medicine

## 2017-01-17 DIAGNOSIS — E559 Vitamin D deficiency, unspecified: Secondary | ICD-10-CM | POA: Insufficient documentation

## 2017-01-17 MED ORDER — ERGOCALCIFEROL 1.25 MG (50000 UT) PO CAPS: 50000.0000 [IU] | ORAL_CAPSULE | ORAL | 2 refills | Status: DC

## 2017-01-18 ENCOUNTER — Ambulatory Visit (INDEPENDENT_AMBULATORY_CARE_PROVIDER_SITE_OTHER): Payer: 59 | Admitting: Internal Medicine

## 2017-01-18 ENCOUNTER — Encounter: Payer: Self-pay | Admitting: Internal Medicine

## 2017-01-18 VITALS — BP 106/72 | HR 71 | Temp 98.5°F | Resp 15 | Ht 64.25 in | Wt 131.8 lb

## 2017-01-18 DIAGNOSIS — N952 Postmenopausal atrophic vaginitis: Secondary | ICD-10-CM | POA: Diagnosis not present

## 2017-01-18 DIAGNOSIS — Z1239 Encounter for other screening for malignant neoplasm of breast: Secondary | ICD-10-CM

## 2017-01-18 DIAGNOSIS — Z1211 Encounter for screening for malignant neoplasm of colon: Secondary | ICD-10-CM | POA: Diagnosis not present

## 2017-01-18 DIAGNOSIS — Z124 Encounter for screening for malignant neoplasm of cervix: Secondary | ICD-10-CM | POA: Diagnosis not present

## 2017-01-18 DIAGNOSIS — E559 Vitamin D deficiency, unspecified: Secondary | ICD-10-CM

## 2017-01-18 DIAGNOSIS — Z0000 Encounter for general adult medical examination without abnormal findings: Secondary | ICD-10-CM | POA: Diagnosis not present

## 2017-01-18 DIAGNOSIS — Z1231 Encounter for screening mammogram for malignant neoplasm of breast: Secondary | ICD-10-CM | POA: Diagnosis not present

## 2017-01-18 NOTE — Patient Instructions (Addendum)
You are in excellent health!  Continue using Premarin since your pelvic exam looked so NORMAL  WE WILL repeat your PAP smear next year   Mammogram is scheduled   cologuard in 5 years    Health Maintenance for Postmenopausal Women Menopause is a normal process in which your reproductive ability comes to an end. This process happens gradually over a span of months to years, usually between the ages of 81 and 30. Menopause is complete when you have missed 12 consecutive menstrual periods. It is important to talk with your health care provider about some of the most common conditions that affect postmenopausal women, such as heart disease, cancer, and bone loss (osteoporosis). Adopting a healthy lifestyle and getting preventive care can help to promote your health and wellness. Those actions can also lower your chances of developing some of these common conditions. What should I know about menopause? During menopause, you may experience a number of symptoms, such as:  Moderate-to-severe hot flashes.  Night sweats.  Decrease in sex drive.  Mood swings.  Headaches.  Tiredness.  Irritability.  Memory problems.  Insomnia.  Choosing to treat or not to treat menopausal changes is an individual decision that you make with your health care provider. What should I know about hormone replacement therapy and supplements? Hormone therapy products are effective for treating symptoms that are associated with menopause, such as hot flashes and night sweats. Hormone replacement carries certain risks, especially as you become older. If you are thinking about using estrogen or estrogen with progestin treatments, discuss the benefits and risks with your health care provider. What should I know about heart disease and stroke? Heart disease, heart attack, and stroke become more likely as you age. This may be due, in part, to the hormonal changes that your body experiences during menopause. These can  affect how your body processes dietary fats, triglycerides, and cholesterol. Heart attack and stroke are both medical emergencies. There are many things that you can do to help prevent heart disease and stroke:  Have your blood pressure checked at least every 1-2 years. High blood pressure causes heart disease and increases the risk of stroke.  If you are 92-49 years old, ask your health care provider if you should take aspirin to prevent a heart attack or a stroke.  Do not use any tobacco products, including cigarettes, chewing tobacco, or electronic cigarettes. If you need help quitting, ask your health care provider.  It is important to eat a healthy diet and maintain a healthy weight. ? Be sure to include plenty of vegetables, fruits, low-fat dairy products, and lean protein. ? Avoid eating foods that are high in solid fats, added sugars, or salt (sodium).  Get regular exercise. This is one of the most important things that you can do for your health. ? Try to exercise for at least 150 minutes each week. The type of exercise that you do should increase your heart rate and make you sweat. This is known as moderate-intensity exercise. ? Try to do strengthening exercises at least twice each week. Do these in addition to the moderate-intensity exercise.  Know your numbers.Ask your health care provider to check your cholesterol and your blood glucose. Continue to have your blood tested as directed by your health care provider.  What should I know about cancer screening? There are several types of cancer. Take the following steps to reduce your risk and to catch any cancer development as early as possible. Breast Cancer  Practice  breast self-awareness. ? This means understanding how your breasts normally appear and feel. ? It also means doing regular breast self-exams. Let your health care provider know about any changes, no matter how small.  If you are 9 or older, have a clinician do a  breast exam (clinical breast exam or CBE) every year. Depending on your age, family history, and medical history, it may be recommended that you also have a yearly breast X-ray (mammogram).  If you have a family history of breast cancer, talk with your health care provider about genetic screening.  If you are at high risk for breast cancer, talk with your health care provider about having an MRI and a mammogram every year.  Breast cancer (BRCA) gene test is recommended for women who have family members with BRCA-related cancers. Results of the assessment will determine the need for genetic counseling and BRCA1 and for BRCA2 testing. BRCA-related cancers include these types: ? Breast. This occurs in males or females. ? Ovarian. ? Tubal. This may also be called fallopian tube cancer. ? Cancer of the abdominal or pelvic lining (peritoneal cancer). ? Prostate. ? Pancreatic.  Cervical, Uterine, and Ovarian Cancer Your health care provider may recommend that you be screened regularly for cancer of the pelvic organs. These include your ovaries, uterus, and vagina. This screening involves a pelvic exam, which includes checking for microscopic changes to the surface of your cervix (Pap test).  For women ages 21-65, health care providers may recommend a pelvic exam and a Pap test every three years. For women ages 59-65, they may recommend the Pap test and pelvic exam, combined with testing for human papilloma virus (HPV), every five years. Some types of HPV increase your risk of cervical cancer. Testing for HPV may also be done on women of any age who have unclear Pap test results.  Other health care providers may not recommend any screening for nonpregnant women who are considered low risk for pelvic cancer and have no symptoms. Ask your health care provider if a screening pelvic exam is right for you.  If you have had past treatment for cervical cancer or a condition that could lead to cancer, you need  Pap tests and screening for cancer for at least 20 years after your treatment. If Pap tests have been discontinued for you, your risk factors (such as having a new sexual partner) need to be reassessed to determine if you should start having screenings again. Some women have medical problems that increase the chance of getting cervical cancer. In these cases, your health care provider may recommend that you have screening and Pap tests more often.  If you have a family history of uterine cancer or ovarian cancer, talk with your health care provider about genetic screening.  If you have vaginal bleeding after reaching menopause, tell your health care provider.  There are currently no reliable tests available to screen for ovarian cancer.  Lung Cancer Lung cancer screening is recommended for adults 24-52 years old who are at high risk for lung cancer because of a history of smoking. A yearly low-dose CT scan of the lungs is recommended if you:  Currently smoke.  Have a history of at least 30 pack-years of smoking and you currently smoke or have quit within the past 15 years. A pack-year is smoking an average of one pack of cigarettes per day for one year.  Yearly screening should:  Continue until it has been 15 years since you quit.  Stop  if you develop a health problem that would prevent you from having lung cancer treatment.  Colorectal Cancer  This type of cancer can be detected and can often be prevented.  Routine colorectal cancer screening usually begins at age 75 and continues through age 52.  If you have risk factors for colon cancer, your health care provider may recommend that you be screened at an earlier age.  If you have a family history of colorectal cancer, talk with your health care provider about genetic screening.  Your health care provider may also recommend using home test kits to check for hidden blood in your stool.  A small camera at the end of a tube can be used  to examine your colon directly (sigmoidoscopy or colonoscopy). This is done to check for the earliest forms of colorectal cancer.  Direct examination of the colon should be repeated every 5-10 years until age 3. However, if early forms of precancerous polyps or small growths are found or if you have a family history or genetic risk for colorectal cancer, you may need to be screened more often.  Skin Cancer  Check your skin from head to toe regularly.  Monitor any moles. Be sure to tell your health care provider: ? About any new moles or changes in moles, especially if there is a change in a mole's shape or color. ? If you have a mole that is larger than the size of a pencil eraser.  If any of your family members has a history of skin cancer, especially at a young age, talk with your health care provider about genetic screening.  Always use sunscreen. Apply sunscreen liberally and repeatedly throughout the day.  Whenever you are outside, protect yourself by wearing long sleeves, pants, a wide-brimmed hat, and sunglasses.  What should I know about osteoporosis? Osteoporosis is a condition in which bone destruction happens more quickly than new bone creation. After menopause, you may be at an increased risk for osteoporosis. To help prevent osteoporosis or the bone fractures that can happen because of osteoporosis, the following is recommended:  If you are 41-29 years old, get at least 1,000 mg of calcium and at least 600 mg of vitamin D per day.  If you are older than age 43 but younger than age 71, get at least 1,200 mg of calcium and at least 600 mg of vitamin D per day.  If you are older than age 20, get at least 1,200 mg of calcium and at least 800 mg of vitamin D per day.  Smoking and excessive alcohol intake increase the risk of osteoporosis. Eat foods that are rich in calcium and vitamin D, and do weight-bearing exercises several times each week as directed by your health care  provider. What should I know about how menopause affects my mental health? Depression may occur at any age, but it is more common as you become older. Common symptoms of depression include:  Low or sad mood.  Changes in sleep patterns.  Changes in appetite or eating patterns.  Feeling an overall lack of motivation or enjoyment of activities that you previously enjoyed.  Frequent crying spells.  Talk with your health care provider if you think that you are experiencing depression. What should I know about immunizations? It is important that you get and maintain your immunizations. These include:  Tetanus, diphtheria, and pertussis (Tdap) booster vaccine.  Influenza every year before the flu season begins.  Pneumonia vaccine.  Shingles vaccine.  Your health  care provider may also recommend other immunizations. This information is not intended to replace advice given to you by your health care provider. Make sure you discuss any questions you have with your health care provider. Document Released: 03/27/2005 Document Revised: 08/23/2015 Document Reviewed: 11/06/2014 Elsevier Interactive Patient Education  2018 [employer].

## 2017-01-18 NOTE — Progress Notes (Signed)
Patient ID: Heather Watson, female    DOB: [birth year]  Age: 61 y.o. MRN: [id number]  The patient is here for annual PREVENTIVE examination and management of other chronic and acute problems.    LAST PAP NORMAL 2016 COLONOSCOPY April 2018 . 5 YR FOLLOW UP ADVISED  DUE TO sister's history of serrated adenoma .  WAS NORMAL IN 2011. Discussion of 5 vs 10 yr follow up today  BREAST US DEC 17 ON LEFT   Recent labs done and reviewed .  Vit d LOW    The risk factors are reflected in the social history.  The roster of all physicians providing medical care to patient - is listed in the Snapshot section of the chart.  Activities of daily living:  The patient is 100% independent in all ADLs: dressing, toileting, feeding as well as independent mobility  Home safety : The patient has smoke detectors in the home. They wear seatbelts.  There are no firearms at home. There is no violence in the home.   There is no risks for hepatitis, STDs or HIV. There is no   history of blood transfusion. They have no travel history to infectious disease endemic areas of the world.  The patient has seen their dentist in the last six month. They have seen their eye doctor in the last year.   They do not  have excessive sun exposure. Discussed the need for sun protection: hats, long sleeves and use of sunscreen if there is significant sun exposure.   Diet: the importance of a healthy diet is discussed. They do have a healthy diet.  The benefits of regular aerobic exercise were discussed. She works out for  60 mintues 5 days per week    Depression screen: there are no signs or vegative symptoms of depression- irritability, change in appetite, anhedonia, sadness/tearfullness. She does feel estranged from her husband , because she does not desire a sexual relationship anymore since menopausal changes, and he has compensaed by the lack of sexual intimacy by taking weekend  physically exerting bike  Rides and hikes. She  does not suspect infidelity .     The following portions of the patient's history were reviewed and updated as appropriate: allergies, current medications, past family history, past medical history,  past surgical history, past social history  and problem list.  Visual acuity was not assessed per patient preference since she has regular follow up with her ophthalmologist. Hearing and body mass index were assessed and reviewed.   During the course of the visit the patient was educated and counseled about appropriate screening and preventive services including : fall prevention , diabetes screening, nutrition counseling, colorectal cancer screening, and recommended immunizations.    CC: The primary encounter diagnosis was Screening for cervical cancer. Diagnoses of Breast cancer screening, Encounter for preventive health examination, Vitamin D deficiency, Postmenopausal atrophic vaginitis, and Colon cancer screening were also pertinent to this visit.  History Heather CockingMaritza has a past medical history of Gallstones and UTI (urinary tract infection).   She has a past surgical history that includes Colonoscopy with propofol (N/A, 05/19/2016).   Her family history includes Arthritis in her mother; Breast cancer (age of onset: 5840) in her maternal aunt; Breast cancer (age of onset: 5087) in her mother; Cancer in her sister; Heart attack in her maternal grandfather; Hypertension in her brother, brother, father, mother, and sister; Stroke in her father and paternal grandfather.She reports that she quit smoking about 35 years ago. Her smoking use  included cigarettes. she has never used smokeless tobacco. She reports that she drinks alcohol. She reports that she does not use drugs.  Outpatient Medications Prior to Visit  Medication Sig Dispense Refill  . ALPRAZolam (XANAX) 0.25 MG tablet Take 1 tablet (0.25 mg total) by mouth at bedtime as needed for sleep. 30 tablet 2  . calcium gluconate 500 MG tablet Take 1 tablet  by mouth daily.    . Cholecalciferol (VITAMIN D) 2000 UNITS tablet Take 2,000 Units by mouth daily. Reported on 02/27/2015    . conjugated estrogens (PREMARIN) vaginal cream INSERT 1 GRAM VAGINALLY AT NIGHT TWICE A WEEK. 30 g 0  . Glucosamine-Chondroitin 500-400 MG CAPS Take 1 capsule by mouth daily.    . magnesium gluconate (MAGONATE) 500 MG tablet Take 800 mg by mouth daily.    . Omega-3 1000 MG CAPS Take 1 capsule by mouth daily.    . TURMERIC PO Take 300 mg by mouth daily.    . Vitamin D, Ergocalciferol, (DRISDOL) 50000 units CAPS capsule     . ergocalciferol (DRISDOL) 50000 units capsule Take 1 capsule (50,000 Units total) by mouth once a week. (Patient not taking: Reported on 01/18/2017) 4 capsule 2  . meloxicam (MOBIC) 7.5 MG tablet Take 1 tablet (7.5 mg total) by mouth daily. As needed for joint pain (Patient not taking: Reported on 01/18/2017) 30 tablet 3  . Omega 3-6-9 Fatty Acids LIQD 2.5 teaspoons daily.     No facility-administered medications prior to visit.     Review of Systems   Patient denies headache, fevers, malaise, unintentional weight loss, skin rash, eye pain, sinus congestion and sinus pain, sore throat, dysphagia,  hemoptysis , cough, dyspnea, wheezing, chest pain, palpitations, orthopnea, edema, abdominal pain, nausea, melena, diarrhea, constipation, flank pain, dysuria, hematuria, urinary  Frequency, nocturia, numbness, tingling, seizures,  Focal weakness, Loss of consciousness,  Tremor, insomnia, depression, anxiety, and suicidal ideation.      Objective:  BP 106/72 (BP Location: Left Arm, Patient Position: Sitting, Cuff Size: Normal)   Pulse 71   Temp 98.5 F (36.9 C) (Oral)   Resp 15   Ht 5' 4.25" (1.632 m)   Wt 131 lb 12.8 oz (59.8 kg)   LMP 04/12/2006   SpO2 99%   BMI 22.45 kg/m   Physical Exam   General Appearance:    Alert, cooperative, no distress, appears stated age  Head:    Normocephalic, without obvious abnormality, atraumatic  Eyes:     PERRL, conjunctiva/corneas clear, EOM's intact, fundi    benign, both eyes  Ears:    Normal TM's and external ear canals, both ears  Nose:   Nares normal, septum midline, mucosa normal, no drainage    or sinus tenderness  Throat:   Lips, mucosa, and tongue normal; teeth and gums normal  Neck:   Supple, symmetrical, trachea midline, no adenopathy;    thyroid:  no enlargement/tenderness/nodules; no carotid   bruit or JVD  Back:     Symmetric, no curvature, ROM normal, no CVA tenderness  Lungs:     Clear to auscultation bilaterally, respirations unlabored  Chest Wall:    No tenderness or deformity   Heart:    Regular rate and rhythm, S1 and S2 normal, no murmur, rub   or gallop  Breast Exam:    No tenderness, masses, or nipple abnormality  Abdomen:     Soft, non-tender, bowel sounds active all four quadrants,    no masses, no organomegaly  Genitalia:  Pelvic: cervix normal in appearance, external genitalia normal, no adnexal masses or tenderness, no cervical motion tenderness, rectovaginal septum normal, uterus normal size, shape, and consistency and vagina normal without discharge  Extremities:   Extremities normal, atraumatic, no cyanosis or edema  Pulses:   2+ and symmetric all extremities  Skin:   Skin color, texture, turgor normal, no rashes or lesions  Lymph nodes:   Cervical, supraclavicular, and axillary nodes normal  Neurologic:   CNII-XII intact, normal strength, sensation and reflexes    throughout      Assessment & Plan:   Problem List Items Addressed This Visit    Colon cancer screening    Normal colonoscopy in 2011.   Repeat in 10 yrs.  Sister has NOT had a diagnosis of CA, only of a serrated adenoma      Encounter for preventive health examination    Annual comprehensive preventive exam was done as well as an evaluation and management of chronic conditions .  During the course of the visit the patient was educated and counseled about appropriate screening and  preventive services including :  diabetes screening, lipid analysis with projected  10 year  risk for CAD , nutrition counseling, breast, cervical and colorectal cancer screening, and recommended immunizations.  Printed recommendations for health maintenance screenings was given.  Pelvic without PAP done,  Normal.       Postmenopausal atrophic vaginitis    imporved with use of Premarin.  Exam is normal.       Vitamin D deficiency    Drisdol prescribed for weekly use x 3 months,  Followed by 2000 Ius daily until may        Other Visit Diagnoses    Screening for cervical cancer    -  Primary   Breast cancer screening          I have discontinued Donice C. Bais's Omega 3-6-9 Fatty Acids, meloxicam, and ergocalciferol. I am also having her maintain her Vitamin D, ALPRAZolam, conjugated estrogens, magnesium gluconate, calcium gluconate, Omega-3, Vitamin D (Ergocalciferol), TURMERIC PO, and Glucosamine-Chondroitin.  No orders of the defined types were placed in this encounter.   Medications Discontinued During This Encounter  Medication Reason  . ergocalciferol (DRISDOL) 50000 units capsule Completed Course  . meloxicam (MOBIC) 7.5 MG tablet Patient has not taken in last 30 days  . Omega 3-6-9 Fatty Acids LIQD Patient has not taken in last 30 days    Follow-up: No Follow-up on file.   Sherlene Shams, MD

## 2017-01-19 NOTE — Assessment & Plan Note (Signed)
Drisdol prescribed for weekly use x 3 months,  Followed by 2000 Ius daily until may

## 2017-01-19 NOTE — Assessment & Plan Note (Signed)
Normal colonoscopy in 2011.   Repeat in 10 yrs.  Sister has NOT had a diagnosis of CA, only of a serrated adenoma

## 2017-01-19 NOTE — Assessment & Plan Note (Signed)
imporved with use of Premarin.  Exam is normal.

## 2017-01-19 NOTE — Assessment & Plan Note (Signed)
Annual comprehensive preventive exam was done as well as an evaluation and management of chronic conditions .  During the course of the visit the patient was educated and counseled about appropriate screening and preventive services including :  diabetes screening, lipid analysis with projected  10 year  risk for CAD , nutrition counseling, breast, cervical and colorectal cancer screening, and recommended immunizations.  Printed recommendations for health maintenance screenings was given.  Pelvic without PAP done,  Normal.

## 2017-01-27 ENCOUNTER — Encounter: Payer: Self-pay | Admitting: Internal Medicine

## 2017-01-27 ENCOUNTER — Other Ambulatory Visit: Payer: Self-pay | Admitting: Internal Medicine

## 2017-01-27 MED ORDER — ESTROGENS, CONJUGATED 0.625 MG/GM VA CREA: TOPICAL_CREAM | VAGINAL | 0 refills | Status: DC

## 2017-01-29 ENCOUNTER — Ambulatory Visit
Admission: RE | Admit: 2017-01-29 | Discharge: 2017-01-29 | Disposition: A | Discharge status: Home / Self Care | Payer: 59 | Source: Ambulatory Visit | Attending: Internal Medicine | Admitting: Internal Medicine

## 2017-01-29 DIAGNOSIS — Z1231 Encounter for screening mammogram for malignant neoplasm of breast: Secondary | ICD-10-CM | POA: Diagnosis not present

## 2017-03-08 ENCOUNTER — Encounter: Payer: Self-pay | Admitting: Family

## 2017-03-08 ENCOUNTER — Ambulatory Visit (INDEPENDENT_AMBULATORY_CARE_PROVIDER_SITE_OTHER): Payer: Self-pay | Admitting: Family

## 2017-03-08 VITALS — BP 110/57 | HR 75 | Temp 98.2°F | Resp 16 | Wt 127.0 lb

## 2017-03-08 DIAGNOSIS — S29012A Strain of muscle and tendon of back wall of thorax, initial encounter: Secondary | ICD-10-CM

## 2017-03-08 MED ORDER — CYCLOBENZAPRINE HCL 5 MG PO TABS: 5.0000 mg | ORAL_TABLET | Freq: Two times a day (BID) | ORAL | 0 refills | Status: DC | PRN

## 2017-03-08 NOTE — Patient Instructions (Signed)
Muscle Strain A muscle strain (pulled muscle) happens when a muscle is stretched beyond normal length. It happens when a sudden, violent force stretches your muscle too far. Usually, a few of the fibers in your muscle are torn. Muscle strain is common in athletes. Recovery usually takes 1-2 weeks. Complete healing takes 5-6 weeks. Follow these instructions at home:  Follow the PRICE method of treatment to help your injury get better. Do this the first 2-3 days after the injury: ? Protect. Protect the muscle to keep it from getting injured again. ? Rest. Limit your activity and rest the injured body part. ? Ice. Put ice in a plastic bag. Place a towel between your skin and the bag. Then, apply the ice and leave it on from 15-20 minutes each hour. After the third day, switch to moist heat packs. ? Compression. Use a splint or elastic bandage on the injured area for comfort. Do not put it on too tightly. ? Elevate. Keep the injured body part above the level of your heart.  Only take medicine as told by your doctor.  Warm up before doing exercise to prevent future muscle strains. Contact a doctor if:  You have more pain or puffiness (swelling) in the injured area.  You feel numbness, tingling, or notice a loss of strength in the injured area. This information is not intended to replace advice given to you by your health care provider. Make sure you discuss any questions you have with your health care provider. Document Released: 11/12/2007 Document Revised: 07/11/2015 Document Reviewed: 09/01/2012 Elsevier Interactive Patient Education  2017 [employer] Inc[employer]  

## 2017-03-08 NOTE — Progress Notes (Signed)
Subjective:     Patient ID: Heather Watson, female   DOB: [birth year], 62 y.o.   MRN: [id number]  HPI 62 year old female is in today with c/o back pain x 1 week after lifting weights. She has been taking 600 mg of ibuprofen intermittently for relief that helps but has not gotten rid of the pain. Pain 4/10, worse after she has been laying down for a while and when she moves  Review of Systems  Constitutional: Negative.   Respiratory: Negative.   Cardiovascular: Negative.   Musculoskeletal: Positive for back pain. Negative for joint swelling and neck stiffness.  Skin: Negative.   Neurological: Negative.   Psychiatric/Behavioral: Negative.    Past Medical History:  Diagnosis Date  . Gallstones   . UTI (urinary tract infection)     Social History   Socioeconomic History  . Marital status: Married    Spouse name: Not on file  . Number of children: Not on file  . Years of education: Not on file  . Highest education level: Not on file  Social Needs  . Financial resource strain: Not on file  . Food insecurity - worry: Not on file  . Food insecurity - inability: Not on file  . Transportation needs - medical: Not on file  . Transportation needs - non-medical: Not on file  Occupational History  . Not on file  Tobacco Use  . Smoking status: Former Smoker    Types: Cigarettes    Last attempt to quit: 08/21/1981    Years since quitting: 35.5  . Smokeless tobacco: Never Used  Substance and Sexual Activity  . Alcohol use: Yes  . Drug use: No  . Sexual activity: Yes    Birth control/protection: Post-menopausal  Other Topics Concern  . Not on file  Social History Narrative  . Not on file    Past Surgical History:  Procedure Laterality Date  . COLONOSCOPY WITH PROPOFOL N/A 05/19/2016   Procedure: COLONOSCOPY WITH PROPOFOL;  Surgeon: Midge Miniumarren Wohl, MD;  Location: [facility] ENDOSCOPY;  Service: Endoscopy;  Laterality: N/A;    Family History  Problem Relation Age of Onset  .  Hypertension Mother   . Arthritis Mother        osteoarthritis  . Breast cancer Mother 1487  . Stroke Father   . Hypertension Father   . Cancer Sister        lymphoma  . Hypertension Sister   . Hypertension Brother   . Heart attack Maternal Grandfather   . Stroke Paternal Grandfather   . Hypertension Brother   . Breast cancer Maternal Aunt 40    Allergies  Allergen Reactions  . Iodine Rash and Itching    Contrast through IV    Current Outpatient Medications on File Prior to Visit  Medication Sig Dispense Refill  . ALPRAZolam (XANAX) 0.25 MG tablet Take 1 tablet (0.25 mg total) by mouth at bedtime as needed for sleep. 30 tablet 2  . calcium gluconate 500 MG tablet Take 1 tablet by mouth daily.    . Cholecalciferol (VITAMIN D) 2000 UNITS tablet Take 2,000 Units by mouth daily. Reported on 02/27/2015    . conjugated estrogens (PREMARIN) vaginal cream INSERT 1 GRAM VAGINALLY AT NIGHT TWICE A WEEK. 30 g 0  . Glucosamine-Chondroitin 500-400 MG CAPS Take 1 capsule by mouth daily.    . magnesium gluconate (MAGONATE) 500 MG tablet Take 800 mg by mouth daily.    . Omega-3 1000 MG CAPS Take 1 capsule by mouth  daily.    . TURMERIC PO Take 300 mg by mouth daily.    . Vitamin D, Ergocalciferol, (DRISDOL) 50000 units CAPS capsule      No current facility-administered medications on file prior to visit.     BP (!) 110/57   Pulse 75   Temp 98.2 F (36.8 C)   Resp 16   Wt 127 lb (57.6 kg)   LMP 04/12/2006   SpO2 99%   BMI 21.63 kg/m chart    Objective:   Physical Exam  Constitutional: She is oriented to person, place, and time. She appears well-developed and well-nourished.  Neck: Normal range of motion.  Cardiovascular: Normal rate, regular rhythm and normal heart sounds.  Pulmonary/Chest: Effort normal and breath sounds normal.  Musculoskeletal: She exhibits tenderness.       Arms: Neurological: She is alert and oriented to person, place, and time. She has normal reflexes. She  displays normal reflexes. Coordination normal.  Skin: Skin is warm.  Psychiatric: She has a normal mood and affect.       Assessment:     [name] was seen today for focus-[place]-?muscle spasm onleft back side.  Diagnoses and all orders for this visit:  Muscle strain of upper back  Other orders -     cyclobenzaprine (FLEXERIL) 5 MG tablet; Take 1 tablet (5 mg total) by mouth 2 (two) times daily as needed for muscle spasms.      Plan:     Call with any questions or concerns. Recheck as needed

## 2017-12-20 ENCOUNTER — Ambulatory Visit (INDEPENDENT_AMBULATORY_CARE_PROVIDER_SITE_OTHER): Payer: Self-pay | Admitting: Physician Assistant

## 2017-12-20 ENCOUNTER — Encounter: Payer: Self-pay | Admitting: Physician Assistant

## 2017-12-20 VITALS — BP 120/80 | HR 76 | Temp 98.2°F | Wt 138.0 lb

## 2017-12-20 DIAGNOSIS — L03012 Cellulitis of left finger: Secondary | ICD-10-CM

## 2017-12-20 MED ORDER — CEFADROXIL 500 MG PO CAPS: 500.0000 mg | ORAL_CAPSULE | Freq: Two times a day (BID) | ORAL | 0 refills | Status: AC

## 2017-12-20 NOTE — Progress Notes (Signed)
Patient ID: Heather Watson DOB: 07/14/1955 AGE: 62 y.o. MRN: [id number]   PCP: Sherlene Shams, MD   Chief Complaint:  Chief Complaint  Patient presents with  . focus-left index finger     Subjective:    HPI:  Heather Watson is a 62 y.o. female presents for evaluation  Chief Complaint  Patient presents with  . focus-left index finger    62 year old female presents with three week history of left index finger swelling. Began as mild. Has gradually worsened. Pain primarily over ulnar aspect of fingernail. Yesterday had scant purulent drainage. Has been applying over the counter antibiotic ointment and soaking in warm water with Epsom salt. Reports throbbing/pulsating pain and tenderness with palpation over ulnar side of finger. Denies known injury/trauma to finger. Unsure if ingrown fingernail. Patient reports recent gardening/mulching.  Patient with poor fingernail health. Has been seen by dermatology. Was told to use OTC Biotin. Patient did not notice any change. Patient states her fingernail health declined with menopause; developed vertical ridges. Patient also with VitD deficiency; currently on a supplement.  Patient denies fever, chills, headache, nausea/vomiting, hand/finger weakness or paresthesias, streaking redness. Patient denies previous history of felon.  A complete, at least 10 system review of symptoms was performed, pertinent positives and negatives as mentioned in HPI, otherwise negative.  The following portions of the patient's history were reviewed and updated as appropriate: allergies, current medications and past medical history.  Patient Active Problem List   Diagnosis Date Noted  . Vitamin D deficiency 01/17/2017  . Colon cancer screening   . Family history of colonic polyps 01/19/2016  . Chronic right SI joint pain 01/19/2016  . History of meniscal tear 02/02/2014  . 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 Encounter for preventive  health examination 01/08/2013  . Onychomycosis of toenail 11/29/2012  . Menopause 05/14/2012  . 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 H/O thyroid cyst 05/14/2012    Allergies  Allergen Reactions  . Iodine Rash and Itching    Contrast through IV    Current Outpatient Medications on File Prior to Visit  Medication Sig Dispense Refill  . ALPRAZolam (XANAX) 0.25 MG tablet Take 1 tablet (0.25 mg total) by mouth at bedtime as needed for sleep. 30 tablet 2  . calcium gluconate 500 MG tablet Take 1 tablet by mouth daily.    . Cholecalciferol (VITAMIN D) 2000 UNITS tablet Take 2,000 Units by mouth daily. Reported on 02/27/2015    . conjugated estrogens (PREMARIN) vaginal cream INSERT 1 GRAM VAGINALLY AT NIGHT TWICE A WEEK. 30 g 0  . Glucosamine-Chondroitin 500-400 MG CAPS Take 1 capsule by mouth daily.    . magnesium gluconate (MAGONATE) 500 MG tablet Take 800 mg by mouth daily.    . Omega-3 1000 MG CAPS Take 1 capsule by mouth daily.     No current facility-administered medications on file prior to visit.        Objective:   Vitals:   12/20/17 1015  BP: 120/80  Pulse: 76  Temp: 98.2 F (36.8 C)  SpO2: 97%     Wt Readings from Last 3 Encounters:  12/20/17 138 lb (62.6 kg)  03/08/17 127 lb (57.6 kg)  01/18/17 131 lb 12.8 oz (59.8 kg)    Physical Exam:   General Appearance:  Alert, cooperative, appears stated age. In no acute distress. Afebrile.  Head:  Normocephalic, without obvious abnormality, atraumatic  Lungs:   Clear to auscultation bilaterally, respirations unlabored  Heart:  Regular rate and rhythm, S1 and S2 normal, no murmur, rub, or gallop  Extremities: Extremities normal, atraumatic, no cyanosis or edema. Hands reveal osteoarthritic changes; Bouchard's and Heberden's nodes. Cuticles with dry/cracking skin. Fingernails with vertical ridges; several cracks. Left index finger reveals diffuse/circumferential edema of distal phalanx. Associated erythema. No  palpable warmth. No palpable fluctuance/isolated abscess. Scant purulent drainage from ulnar aspect of cuticle. No raised area of edema at cuticle suggestive of a paronychia amenable to I&D. Mild tenderness with palpation at ulnar aspect of fingernail. No significant tenderness with palpation over palmar aspect of distal phalanx. No streaking redness. No bleeding. Limited ROM at DIP secondary to edema. Sensation intact. Brisk capillary refill.  Pulses: 2+ and symmetric  Skin: Skin color, texture, turgor normal, no rashes or lesions  Lymph nodes: Cervical, supraclavicular, and axillary nodes normal  Neurologic: Normal    Assessment & Plan:    Exam findings, diagnosis etiology and medication use and indications reviewed with patient. Follow-Up and discharge instructions provided. No emergent/urgent issues found on exam.  Patient education was provided.   Patient verbalized understanding of information provided and agrees with plan of care (POC), all questions answered. The patient is advised to call or return to clinic if condition does not see an improvement in symptoms, or to seek the care of the closest emergency department if condition worsens with the below plan.   1. Felon of finger of left hand  - cefadroxil (DURICEF) 500 MG capsule; Take 1 capsule (500 mg total) by mouth 2 (two) times daily for 7 days.  Dispense: 14 capsule; Refill: 0  2. Paronychia of left index finger  Patient with 3 week history of left index finger, distal phalanx, edema/erythema. Physical exam suggestive of both felon (diffuse edema/erythema of distal phalanx) and paronychia (purulent drainage from ulnar aspect of cuticle). Advised continuation of hot water with Epsom salt soaks. Prescribed Cefadroxil. If no improvement in two days, will re-evaluate. Possibility of requiring I&D vs referral to ED/urgent care for x-ray for evaluation of osteomyelitis (would require IV antibiotics/ortho referral).   [name],  MHS, PA-C Rulon Sera, MHS, PA-C Advanced Practice Provider [facility]  [address] [facility], [address] [place], [place] [address] (p):  [phone] [email or web address]

## 2017-12-20 NOTE — Patient Instructions (Signed)
Thank you for choosing [facility] for your health care needs.  You have been diagnosed with a FELON and PARONYCHIA of the left index finger.  Take Tylenol or ibuprofen for pain/discomfort. Soak in hot water with Epsom salt, 15-20 minutes at a time, several times a day.  You have been prescribed an antibiotic, Cefadroxil. Take 1 tablet twice a day x 7 days.  Follow-up at [facility], at the urgent care, or with your family physician if not improving in two days. At that time, may need to perform an incision & drainage procedure to evacuate the pus.  Hope you feel better soon.  Paronychia Paronychia is an infection of the skin. It happens near a fingernail or toenail. It may cause pain and swelling around the nail. Usually, it is not serious and it clears up with treatment. Follow these instructions at home:  Soak the fingers or toes in warm water as told by your doctor. You may be told to do this for 20 minutes, 2-3 times a day.  Keep the area dry when you are not soaking it.  Take medicines only as told by your doctor.  If you were given an antibiotic medicine, finish all of it even if you start to feel better.  Keep the affected area clean.  Do not try to drain a fluid-filled bump yourself.  Wear rubber gloves when putting your hands in water.  Wear gloves if your hands might touch cleaners or chemicals.  Follow your doctor's instructions about: ? Wound care. ? Bandage (dressing) changes and removal. Contact a doctor if:  Your symptoms get worse or do not improve.  You have a fever or chills.  You have redness spreading from the affected area.  You have more fluid, blood, or pus coming from the affected area.  Your finger or knuckle is swollen or is hard to move. This information is not intended to replace advice given to you by your health care provider. Make sure you discuss any questions you have with your health care provider. Document Released: 01/21/2009 Document  Revised: 07/11/2015 Document Reviewed: 01/10/2014 Elsevier Interactive Patient Education  [employer].

## 2017-12-23 ENCOUNTER — Telehealth: Payer: Self-pay | Admitting: Emergency Medicine

## 2017-12-23 NOTE — Telephone Encounter (Signed)
Left message follow up call from [facility] visit 

## 2017-12-30 ENCOUNTER — Other Ambulatory Visit: Payer: Self-pay | Admitting: Internal Medicine

## 2017-12-30 DIAGNOSIS — Z1231 Encounter for screening mammogram for malignant neoplasm of breast: Secondary | ICD-10-CM

## 2018-01-17 ENCOUNTER — Telehealth: Payer: Self-pay | Admitting: Radiology

## 2018-01-17 DIAGNOSIS — Z8639 Personal history of other endocrine, nutritional and metabolic disease: Secondary | ICD-10-CM

## 2018-01-17 DIAGNOSIS — Z0000 Encounter for general adult medical examination without abnormal findings: Secondary | ICD-10-CM

## 2018-01-17 NOTE — Addendum Note (Signed)
Addended by: Sherlene ShamsULLO, Melvine Julin L on: 01/17/2018 05:23 PM   Modules accepted: Orders

## 2018-01-17 NOTE — Telephone Encounter (Signed)
Pt coming in for labs tomorrow, please place future orders. Thank you.  

## 2018-01-18 ENCOUNTER — Other Ambulatory Visit (INDEPENDENT_AMBULATORY_CARE_PROVIDER_SITE_OTHER): Payer: No Typology Code available for payment source

## 2018-01-18 DIAGNOSIS — Z8639 Personal history of other endocrine, nutritional and metabolic disease: Secondary | ICD-10-CM

## 2018-01-18 DIAGNOSIS — Z0000 Encounter for general adult medical examination without abnormal findings: Secondary | ICD-10-CM

## 2018-01-18 LAB — LIPID PANEL
CHOL/HDL RATIO: 2
Cholesterol: 160 mg/dL (ref 0–200)
HDL: 75.4 mg/dL (ref >39.00)
LDL CALC: 76 mg/dL (ref 0–99)
NONHDL: 84.67
TRIGLYCERIDES: 45 mg/dL (ref 0.0–149.0)
VLDL: 9 mg/dL (ref 0.0–40.0)

## 2018-01-18 LAB — COMPREHENSIVE METABOLIC PANEL
ALT: 20 U/L (ref 0–35)
AST: 21 U/L (ref 0–37)
Albumin: 4.3 g/dL (ref 3.5–5.2)
Alkaline Phosphatase: 65 U/L (ref 39–117)
BILIRUBIN TOTAL: 0.6 mg/dL (ref 0.2–1.2)
BUN: 18 mg/dL (ref 6–23)
CHLORIDE: 105 meq/L (ref 96–112)
CO2: 30 meq/L (ref 19–32)
Calcium: 9.4 mg/dL (ref 8.4–10.5)
Creatinine, Ser: 0.65 mg/dL (ref 0.40–1.20)
GFR: 98.16 mL/min (ref >60.00)
GLUCOSE: 94 mg/dL (ref 70–99)
POTASSIUM: 4.3 meq/L (ref 3.5–5.1)
Sodium: 141 mEq/L (ref 135–145)
Total Protein: 6.9 g/dL (ref 6.0–8.3)

## 2018-01-18 LAB — TSH: TSH: 1.47 u[IU]/mL (ref 0.35–4.50)

## 2018-01-20 ENCOUNTER — Other Ambulatory Visit (HOSPITAL_COMMUNITY)
Admission: RE | Admit: 2018-01-20 | Discharge: 2018-01-20 | Disposition: A | Discharge status: Home / Self Care | Payer: No Typology Code available for payment source | Source: Ambulatory Visit | Attending: Internal Medicine | Admitting: Internal Medicine

## 2018-01-20 ENCOUNTER — Ambulatory Visit (INDEPENDENT_AMBULATORY_CARE_PROVIDER_SITE_OTHER): Payer: No Typology Code available for payment source | Admitting: Internal Medicine

## 2018-01-20 ENCOUNTER — Telehealth: Payer: Self-pay | Admitting: Internal Medicine

## 2018-01-20 VITALS — BP 110/66 | HR 62 | Temp 98.5°F | Resp 14 | Ht 64.0 in | Wt 128.4 lb

## 2018-01-20 DIAGNOSIS — Z0000 Encounter for general adult medical examination without abnormal findings: Secondary | ICD-10-CM

## 2018-01-20 DIAGNOSIS — Z124 Encounter for screening for malignant neoplasm of cervix: Secondary | ICD-10-CM | POA: Diagnosis not present

## 2018-01-20 DIAGNOSIS — Z23 Encounter for immunization: Secondary | ICD-10-CM | POA: Diagnosis not present

## 2018-01-20 DIAGNOSIS — N952 Postmenopausal atrophic vaginitis: Secondary | ICD-10-CM | POA: Diagnosis not present

## 2018-01-20 MED ORDER — ZOSTER VAC RECOMB ADJUVANTED 50 MCG/0.5ML IM SUSR: 0.5000 mL | Freq: Once | INTRAMUSCULAR | 1 refills | Status: AC

## 2018-01-20 NOTE — Telephone Encounter (Signed)
Pt will need fasting labs before next appt next year. Orders needed please and thank you!

## 2018-01-20 NOTE — Patient Instructions (Signed)
Your cholesterol, liver and kidney function are excellent!.  Keep doing exactly what you are doing with regard to diet and exercise    I  Will refill the alprazolam and Estrogen  Cream.     Sublingual melatonin spray is worth trying as well,  Alternating your insomnia medications is the best way to prevent longterm complications of anything tried  CBD has been claimed to be beneficial in many conditions. Research of CBD is difficult and access is limited due to state and federal regulations and therefore we are legally not allowed to recommend it. Patient can get it on her own, I'm not actually able to give her instructions on how to increase or decrease dosing--there are no guidelines for it, and because every supplier is different, I can't really be sure what any given dose would do for her condition. There are no guarantees of the concentration, purity, or contaminants on the products available over the counter. CBD oil is not the same as THC (Tetrahydrocannabinol) which is the ingredient in marijuana that causes psychoactive effects ("high"), there is still a change that they could test positive for THC on a Urine drug screen. This would be considered an abarency against their pain contract (if they have one) and may result in them being weaned off their opioids.   Health Maintenance for Postmenopausal Women Menopause is a normal process in which your reproductive ability comes to an end. This process happens gradually over a span of months to years, usually between the ages of 77 and 32. Menopause is complete when you have missed 12 consecutive menstrual periods. It is important to talk with your health care provider about some of the most common conditions that affect postmenopausal women, such as heart disease, cancer, and bone loss (osteoporosis). Adopting a healthy lifestyle and getting preventive care can help to promote your health and wellness. Those actions can also lower your chances of  developing some of these common conditions. What should I know about menopause? During menopause, you may experience a number of symptoms, such as:  Moderate-to-severe hot flashes.  Night sweats.  Decrease in sex drive.  Mood swings.  Headaches.  Tiredness.  Irritability.  Memory problems.  Insomnia.  Choosing to treat or not to treat menopausal changes is an individual decision that you make with your health care provider. What should I know about hormone replacement therapy and supplements? Hormone therapy products are effective for treating symptoms that are associated with menopause, such as hot flashes and night sweats. Hormone replacement carries certain risks, especially as you become older. If you are thinking about using estrogen or estrogen with progestin treatments, discuss the benefits and risks with your health care provider. What should I know about heart disease and stroke? Heart disease, heart attack, and stroke become more likely as you age. This may be due, in part, to the hormonal changes that your body experiences during menopause. These can affect how your body processes dietary fats, triglycerides, and cholesterol. Heart attack and stroke are both medical emergencies. There are many things that you can do to help prevent heart disease and stroke:  Have your blood pressure checked at least every 1-2 years. High blood pressure causes heart disease and increases the risk of stroke.  If you are 57-65 years old, ask your health care provider if you should take aspirin to prevent a heart attack or a stroke.  Do not use any tobacco products, including cigarettes, chewing tobacco, or electronic cigarettes. If you need  help quitting, ask your health care provider.  It is important to eat a healthy diet and maintain a healthy weight. ? Be sure to include plenty of vegetables, fruits, low-fat dairy products, and lean protein. ? Avoid eating foods that are high in solid  fats, added sugars, or salt (sodium).  Get regular exercise. This is one of the most important things that you can do for your health. ? Try to exercise for at least 150 minutes each week. The type of exercise that you do should increase your heart rate and make you sweat. This is known as moderate-intensity exercise. ? Try to do strengthening exercises at least twice each week. Do these in addition to the moderate-intensity exercise.  Know your numbers.Ask your health care provider to check your cholesterol and your blood glucose. Continue to have your blood tested as directed by your health care provider.  What should I know about cancer screening? There are several types of cancer. Take the following steps to reduce your risk and to catch any cancer development as early as possible. Breast Cancer  Practice breast self-awareness. ? This means understanding how your breasts normally appear and feel. ? It also means doing regular breast self-exams. Let your health care provider know about any changes, no matter how small.  If you are 61 or older, have a clinician do a breast exam (clinical breast exam or CBE) every year. Depending on your age, family history, and medical history, it may be recommended that you also have a yearly breast X-ray (mammogram).  If you have a family history of breast cancer, talk with your health care provider about genetic screening.  If you are at high risk for breast cancer, talk with your health care provider about having an MRI and a mammogram every year.  Breast cancer (BRCA) gene test is recommended for women who have family members with BRCA-related cancers. Results of the assessment will determine the need for genetic counseling and BRCA1 and for BRCA2 testing. BRCA-related cancers include these types: ? Breast. This occurs in males or females. ? Ovarian. ? Tubal. This may also be called fallopian tube cancer. ? Cancer of the abdominal or pelvic lining  (peritoneal cancer). ? Prostate. ? Pancreatic.  Cervical, Uterine, and Ovarian Cancer Your health care provider may recommend that you be screened regularly for cancer of the pelvic organs. These include your ovaries, uterus, and vagina. This screening involves a pelvic exam, which includes checking for microscopic changes to the surface of your cervix (Pap test).  For women ages 21-65, health care providers may recommend a pelvic exam and a Pap test every three years. For women ages 74-65, they may recommend the Pap test and pelvic exam, combined with testing for human papilloma virus (HPV), every five years. Some types of HPV increase your risk of cervical cancer. Testing for HPV may also be done on women of any age who have unclear Pap test results.  Other health care providers may not recommend any screening for nonpregnant women who are considered low risk for pelvic cancer and have no symptoms. Ask your health care provider if a screening pelvic exam is right for you.  If you have had past treatment for cervical cancer or a condition that could lead to cancer, you need Pap tests and screening for cancer for at least 20 years after your treatment. If Pap tests have been discontinued for you, your risk factors (such as having a new sexual partner) need to be reassessed  to determine if you should start having screenings again. Some women have medical problems that increase the chance of getting cervical cancer. In these cases, your health care provider may recommend that you have screening and Pap tests more often.  If you have a family history of uterine cancer or ovarian cancer, talk with your health care provider about genetic screening.  If you have vaginal bleeding after reaching menopause, tell your health care provider.  There are currently no reliable tests available to screen for ovarian cancer.  Lung Cancer Lung cancer screening is recommended for adults 44-31 years old who are at  high risk for lung cancer because of a history of smoking. A yearly low-dose CT scan of the lungs is recommended if you:  Currently smoke.  Have a history of at least 30 pack-years of smoking and you currently smoke or have quit within the past 15 years. A pack-year is smoking an average of one pack of cigarettes per day for one year.  Yearly screening should:  Continue until it has been 15 years since you quit.  Stop if you develop a health problem that would prevent you from having lung cancer treatment.  Colorectal Cancer  This type of cancer can be detected and can often be prevented.  Routine colorectal cancer screening usually begins at age 44 and continues through age 72.  If you have risk factors for colon cancer, your health care provider may recommend that you be screened at an earlier age.  If you have a family history of colorectal cancer, talk with your health care provider about genetic screening.  Your health care provider may also recommend using home test kits to check for hidden blood in your stool.  A small camera at the end of a tube can be used to examine your colon directly (sigmoidoscopy or colonoscopy). This is done to check for the earliest forms of colorectal cancer.  Direct examination of the colon should be repeated every 5-10 years until age 40. However, if early forms of precancerous polyps or small growths are found or if you have a family history or genetic risk for colorectal cancer, you may need to be screened more often.  Skin Cancer  Check your skin from head to toe regularly.  Monitor any moles. Be sure to tell your health care provider: ? About any new moles or changes in moles, especially if there is a change in a mole's shape or color. ? If you have a mole that is larger than the size of a pencil eraser.  If any of your family members has a history of skin cancer, especially at a young age, talk with your health care provider about genetic  screening.  Always use sunscreen. Apply sunscreen liberally and repeatedly throughout the day.  Whenever you are outside, protect yourself by wearing long sleeves, pants, a wide-brimmed hat, and sunglasses.  What should I know about osteoporosis? Osteoporosis is a condition in which bone destruction happens more quickly than new bone creation. After menopause, you may be at an increased risk for osteoporosis. To help prevent osteoporosis or the bone fractures that can happen because of osteoporosis, the following is recommended:  If you are 74-21 years old, get at least 1,000 mg of calcium and at least 600 mg of vitamin D per day.  If you are older than age 1 but younger than age 31, get at least 1,200 mg of calcium and at least 600 mg of vitamin D per day.  If you are older than age 51, get at least 1,200 mg of calcium and at least 800 mg of vitamin D per day.  Smoking and excessive alcohol intake increase the risk of osteoporosis. Eat foods that are rich in calcium and vitamin D, and do weight-bearing exercises several times each week as directed by your health care provider. What should I know about how menopause affects my mental health? Depression may occur at any age, but it is more common as you become older. Common symptoms of depression include:  Low or sad mood.  Changes in sleep patterns.  Changes in appetite or eating patterns.  Feeling an overall lack of motivation or enjoyment of activities that you previously enjoyed.  Frequent crying spells.  Talk with your health care provider if you think that you are experiencing depression. What should I know about immunizations? It is important that you get and maintain your immunizations. These include:  Tetanus, diphtheria, and pertussis (Tdap) booster vaccine.  Influenza every year before the flu season begins.  Pneumonia vaccine.  Shingles vaccine.  Your health care provider may also recommend other  immunizations. This information is not intended to replace advice given to you by your health care provider. Make sure you discuss any questions you have with your health care provider. Document Released: 03/27/2005 Document Revised: 08/23/2015 Document Reviewed: 11/06/2014 Elsevier Interactive Patient Education  2018 [employer].

## 2018-01-20 NOTE — Progress Notes (Signed)
Patient ID: Heather Watson, female    DOB: Jan 16, 1956  Age: 62 y.o. MRN: [id number]  The patient is here for annual preventive wellness examination and management of other chronic and acute problems.   The risk factors are reflected in the social history.  The roster of all physicians providing medical care to patient - is listed in the Snapshot section of the chart.  Activities of daily living:  The patient is 100% independent in all ADLs: dressing, toileting, feeding as well as independent mobility  Home safety : The patient has smoke detectors in the home. They wear seatbelts.  There are no firearms at home. There is no violence in the home.   There is no risks for hepatitis, STDs or HIV. There is no   history of blood transfusion. They have no travel history to infectious disease endemic areas of the world.  The patient has seen their dentist in the last six month. They have seen their eye doctor in the last year. They deny hearing difficulty .   They do not  have excessive sun exposure. Discussed the need for sun protection: hats, long sleeves and use of sunscreen if there is significant sun exposure.   Diet: the importance of a healthy diet is discussed. She has a very healthy diet. the benefits of regular aerobic exercise were discussed. She exercises 5  times per week ,  60 minutes.   Depression screen: there are no signs or vegative symptoms of depression- irritability, change in appetite, anhedonia, sadness/tearfullness.  Cognitive assessment: the patient manages all their financial and personal affairs and is actively engaged. They could relate day,date,year and events; recalled 2/3 objects at 3 minutes; performed clock-face test normally.  The following portions of the patient's history were reviewed and updated as appropriate: allergies, current medications, past family history, past medical history,  past surgical history, past social history  and problem list.  Visual  acuity was not assessed per patient preference since she has regular follow up with her ophthalmologist. Hearing and body mass index were assessed and reviewed.   During the course of the visit the patient was educated and counseled about appropriate screening and preventive services including : fall prevention , diabetes screening, nutrition counseling, colorectal cancer screening, and recommended immunizations.    CC: The primary encounter diagnosis was Encounter for preventive health examination. Diagnoses of Screening for cervical cancer, Need for diphtheria-tetanus-pertussis (Tdap) vaccine, and Postmenopausal atrophic vaginitis were also pertinent to this visit.  History Heather Watson has a past medical history of Gallstones and UTI (urinary tract infection).   She has a past surgical history that includes Colonoscopy with propofol (N/A, 05/19/2016).   Her family history includes Arthritis in her mother; Breast cancer (age of onset: 5) in her maternal aunt; Breast cancer (age of onset: 3) in her mother; Cancer in her sister; Heart attack in her maternal grandfather; Hypertension in her brother, brother, father, mother, and sister; Stroke in her father and paternal grandfather.She reports that she quit smoking about 36 years ago. Her smoking use included cigarettes. She has never used smokeless tobacco. She reports that she drinks alcohol. She reports that she does not use drugs.  Outpatient Medications Prior to Visit  Medication Sig Dispense Refill  . ALPRAZolam (XANAX) 0.25 MG tablet Take 1 tablet (0.25 mg total) by mouth at bedtime as needed for sleep. 30 tablet 2  . calcium gluconate 500 MG tablet Take 1 tablet by mouth daily.    . Cholecalciferol (VITAMIN D)  2000 UNITS tablet Take 2,000 Units by mouth daily. Reported on 02/27/2015    . conjugated estrogens (PREMARIN) vaginal cream INSERT 1 GRAM VAGINALLY AT NIGHT TWICE A WEEK. 30 g 0  . Glucosamine-Chondroitin 500-400 MG CAPS Take 1 capsule by  mouth daily.    . magnesium gluconate (MAGONATE) 500 MG tablet Take 800 mg by mouth daily.    . Omega-3 1000 MG CAPS Take 1 capsule by mouth daily.     No facility-administered medications prior to visit.     Review of Systems   Patient denies headache, fevers, malaise, unintentional weight loss, skin rash, eye pain, sinus congestion and sinus pain, sore throat, dysphagia,  hemoptysis , cough, dyspnea, wheezing, chest pain, palpitations, orthopnea, edema, abdominal pain, nausea, melena, diarrhea, constipation, flank pain, dysuria, hematuria, urinary  Frequency, nocturia, numbness, tingling, seizures,  Focal weakness, Loss of consciousness,  Tremor, insomnia, depression, anxiety, and suicidal ideation.      Objective:  BP 110/66 (BP Location: Right Arm)   Pulse 62   Temp 98.5 F (36.9 C)   Resp 14   Ht 5\' 4"  (1.626 m)   Wt 128 lb 6.4 oz (58.2 kg)   LMP 04/12/2006   SpO2 99%   BMI 22.04 kg/m   Physical Exam  General Appearance:    Alert, cooperative, no distress, appears stated age  Head:    Normocephalic, without obvious abnormality, atraumatic  Eyes:    PERRL, conjunctiva/corneas clear, EOM's intact, fundi    benign, both eyes  Ears:    Normal TM's and external ear canals, both ears  Nose:   Nares normal, septum midline, mucosa normal, no drainage    or sinus tenderness  Throat:   Lips, mucosa, and tongue normal; teeth and gums normal  Neck:   Supple, symmetrical, trachea midline, no adenopathy;    thyroid:  no enlargement/tenderness/nodules; no carotid   bruit or JVD  Back:     Symmetric, no curvature, ROM normal, no CVA tenderness  Lungs:     Clear to auscultation bilaterally, respirations unlabored  Chest Wall:    No tenderness or deformity   Heart:    Regular rate and rhythm, S1 and S2 normal, no murmur, rub   or gallop  Breast Exam:    No tenderness, masses, or nipple abnormality  Abdomen:     Soft, non-tender, bowel sounds active all four quadrants,    no masses,  no organomegaly  Genitalia:    Pelvic: cervix normal in appearance, external genitalia normal, no adnexal masses or tenderness, no cervical motion tenderness, rectovaginal septum normal, uterus normal size, shape, and consistency and vagina normal without discharge  Extremities:   Extremities normal, atraumatic, no cyanosis or edema  Pulses:   2+ and symmetric all extremities  Skin:   Skin color, texture, turgor normal, no rashes or lesions  Lymph nodes:   Cervical, supraclavicular, and axillary nodes normal  Neurologic:   CNII-XII intact, normal strength, sensation and reflexes    throughout     Assessment & Plan:   Problem List Items Addressed This Visit    Encounter for preventive health examination - Primary    Annual comprehensive preventive exam was done as well as an evaluation and management of chronic conditions .  During the course of the visit the patient was educated and counseled about appropriate screening and preventive services including :  diabetes screening, lipid analysis with projected  10 year  risk for CAD , nutrition counseling, breast, cervical and colorectal cancer  screening, and recommended immunizations.  Printed recommendations for health maintenance screenings was given.  Lab Results  Component Value Date   CHOL 160 01/18/2018   HDL 75.40 01/18/2018   LDLCALC 76 01/18/2018   LDLDIRECT 69.0 01/15/2017   TRIG 45.0 01/18/2018   CHOLHDL 2 01/18/2018         Postmenopausal atrophic vaginitis    Improved symptoms with use of Premarin.  Exam is normal.  Discussed the relative risks of vaginal estrogen and encouraged continued use to prevent recurrent symptoms of vaginitis and dysuria        Other Visit Diagnoses    Screening for cervical cancer       Relevant Orders   Cytology - PAP( [place])   Need for diphtheria-tetanus-pertussis (Tdap) vaccine       Relevant Orders   Tdap vaccine greater than or equal to 7yo IM (Completed)      I am having  Heather Watson start on Zoster Vaccine Adjuvanted. I am also having her maintain her Vitamin D, ALPRAZolam, magnesium gluconate, calcium gluconate, Omega-3, Glucosamine-Chondroitin, and conjugated estrogens.  Meds ordered this encounter  Medications  . Zoster Vaccine Adjuvanted [facility]) injection    Sig: Inject 0.5 mLs into the muscle once for 1 dose.    Dispense:  1 each    Refill:  1    There are no discontinued medications.  Follow-up: No follow-ups on file.   Sherlene Shams, MD

## 2018-01-22 ENCOUNTER — Encounter: Payer: Self-pay | Admitting: Internal Medicine

## 2018-01-22 NOTE — Assessment & Plan Note (Signed)
Improved symptoms with use of Premarin.  Exam is normal.  Discussed the relative risks of vaginal estrogen and encouraged continued use to prevent recurrent symptoms of vaginitis and dysuria  

## 2018-01-22 NOTE — Assessment & Plan Note (Signed)
Annual comprehensive preventive exam was done as well as an evaluation and management of chronic conditions .  During the course of the visit the patient was educated and counseled about appropriate screening and preventive services including :  diabetes screening, lipid analysis with projected  10 year  risk for CAD , nutrition counseling, breast, cervical and colorectal cancer screening, and recommended immunizations.  Printed recommendations for health maintenance screenings was given.  Lab Results  Component Value Date   CHOL 160 01/18/2018   HDL 75.40 01/18/2018   LDLCALC 76 01/18/2018   LDLDIRECT 69.0 01/15/2017   TRIG 45.0 01/18/2018   CHOLHDL 2 01/18/2018

## 2018-01-24 LAB — CYTOLOGY - PAP: HPV (WINDOPATH): DETECTED — AB

## 2018-01-24 NOTE — Telephone Encounter (Signed)
Labs have been ordered for lab appt next year.

## 2018-01-26 ENCOUNTER — Encounter: Payer: Self-pay | Admitting: Physician Assistant

## 2018-01-26 ENCOUNTER — Ambulatory Visit (INDEPENDENT_AMBULATORY_CARE_PROVIDER_SITE_OTHER): Payer: Self-pay | Admitting: Physician Assistant

## 2018-01-26 VITALS — BP 120/80 | HR 79 | Temp 97.8°F | Wt 130.0 lb

## 2018-01-26 DIAGNOSIS — R300 Dysuria: Secondary | ICD-10-CM

## 2018-01-26 DIAGNOSIS — R350 Frequency of micturition: Secondary | ICD-10-CM

## 2018-01-26 LAB — POCT URINALYSIS DIPSTICK
Bilirubin, UA: NEGATIVE
Blood, UA: 10
Glucose, UA: NEGATIVE
Ketones, UA: NEGATIVE
Leukocytes, UA: NEGATIVE
Nitrite, UA: NEGATIVE
Protein, UA: NEGATIVE
Spec Grav, UA: <=1.005 — AB (ref 1.010–1.025)
Urobilinogen, UA: 0.2 E.U./dL
pH, UA: 8 (ref 5.0–8.0)

## 2018-01-26 MED ORDER — NITROFURANTOIN MONOHYD MACRO 100 MG PO CAPS: 100.0000 mg | ORAL_CAPSULE | Freq: Two times a day (BID) | ORAL | 0 refills | Status: AC

## 2018-01-26 MED ORDER — PHENAZOPYRIDINE HCL 200 MG PO TABS: 200.0000 mg | ORAL_TABLET | Freq: Three times a day (TID) | ORAL | 0 refills | Status: DC | PRN

## 2018-01-26 NOTE — Patient Instructions (Signed)
Thank you for choosing [facility] for your health care needs.  You have been having lower abdominal discomfort and increased urinary frequency.  Your urinalysis performed in the office revealed no evidence of infection.  You have been prescribed antibiotic, Macrobid. For possible urinary tract infection. You have also been prescribed Pyridium. May take 1 tablet three times a day for next 1-2 days.  Increase fluids. Empty bladder frequently. May drink cranberry juice.  Follow-up with family physician, Ob/Gyn, or urgent care if your symptoms have not resolved in 2-3 days. At that time, further discussion of use of pessary and a urine culture should be performed.  Hope you feel better soon.  Urinary Tract Infection, Adult A urinary tract infection (UTI) is an infection of any part of the urinary tract. The urinary tract includes the:  Kidneys.  Ureters.  Bladder.  Urethra.  These organs make, store, and get rid of pee (urine) in the body. Follow these instructions at home:  Take over-the-counter and prescription medicines only as told by your doctor.  If you were prescribed an antibiotic medicine, take it as told by your doctor. Do not stop taking the antibiotic even if you start to feel better.  Avoid the following drinks: ? Alcohol. ? Caffeine. ? Tea. ? Carbonated drinks.  Drink enough fluid to keep your pee clear or pale yellow.  Keep all follow-up visits as told by your doctor. This is important.  Make sure to: ? Empty your bladder often and completely. Do not to hold pee for long periods of time. ? Empty your bladder before and after sex. ? Wipe from front to back after a bowel movement if you are female. Use each tissue one time when you wipe. Contact a doctor if:  You have back pain.  You have a fever.  You feel sick to your stomach (nauseous).  You throw up (vomit).  Your symptoms do not get better after 3 days.  Your symptoms go away and then come  back. Get help right away if:  You have very bad back pain.  You have very bad lower belly (abdominal) pain.  You are throwing up and cannot keep down any medicines or water. This information is not intended to replace advice given to you by your health care provider. Make sure you discuss any questions you have with your health care provider. Document Released: 07/22/2007 Document Revised: 07/11/2015 Document Reviewed: 12/24/2014 Elsevier Interactive Patient Education  [employer]2018 Elsevier Inc.

## 2018-01-26 NOTE — Progress Notes (Signed)
Patient ID: Heather Watson DOB: 12-Oct-1955 AGE: 62 y.o. MRN: [id number]   PCP: Sherlene Shams, MD   Chief Complaint:  Chief Complaint  Patient presents with  . Urinary Tract Infection    x1d     Subjective:    HPI:  Heather Watson is a 62 y.o. female presents for evaluation  Chief Complaint  Patient presents with  . Urinary Tract Infection    x36d    62 year old female presents to [facility] with 1 day history of suprapubic pressure/uncomfortable fullness. Began yesterday afternoon/evening. Associated increased urinary frequency, constant sensation of needing to void, and chills/goosebumps sensation when urinating. Took one dose of leftover rx Pyridium 200mg  (prescribed in 2016) yesterday evening; provided moderate symptom improvement. This morning took OTC Azo 95mg , two tablets, with no improvement (patient states urine did not change orange color). Denies fever, back ache/flank pain, nausea/vomiting, severe abdominal pain, vaginal rash/discharge/pruritis, dysuria, hematuria, foul-odor to urine, increased urinary urgency.   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 Patient saw PCP last week. Was advised to try previously prescribed pessary (used years ago, prescribed by Ob/Gyn for use during sexual intercourse) for possible relief with stress incontinence. Patient has been using past week, inserting with extra Premarin cream. Suspects recent use of pessary may have caused UTI.  Patient states she used to frequently have UTIs. However, has decreased in frequency due to not having sexual intercourse. Last UTI November 2016, seen at [facility], prescribed Cipro. Patient with episode in January 2018 of dysuria; was attributed to recent travel and increase coffee consumption, no antibiotic prescribed.  ROS: pertinent positives and negatives as mentioned in HPI.  The  following portions of the patient's history were reviewed and updated as appropriate: allergies, current medications and past medical history.  Patient Active Problem List   Diagnosis Date Noted  . Vitamin D deficiency 01/17/2017  . Colon cancer screening   . Family history of colonic polyps 01/19/2016  . Chronic right SI joint pain 01/19/2016  . History of meniscal tear 02/02/2014  . 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 Encounter for preventive health examination 01/08/2013  . Menopause 05/14/2012  . 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 H/O thyroid cyst 05/14/2012    Allergies  Allergen Reactions  . Iodine Rash and Itching    Contrast through IV    Current Outpatient Medications on File Prior to Visit  Medication Sig Dispense Refill  . ALPRAZolam (XANAX) 0.25 MG tablet Take 1 tablet (0.25 mg total) by mouth at bedtime as needed for sleep. 30 tablet 2  . calcium gluconate 500 MG tablet Take 1 tablet by mouth daily.    . Cholecalciferol (VITAMIN D) 2000 UNITS tablet Take 2,000 Units by mouth daily. Reported on 02/27/2015    . conjugated estrogens (PREMARIN) vaginal cream INSERT 1 GRAM VAGINALLY AT NIGHT TWICE A WEEK. 30 g 0  . Glucosamine-Chondroitin 500-400 MG CAPS Take 1 capsule by mouth daily.    . magnesium gluconate (MAGONATE) 500 MG tablet Take 800 mg by mouth daily.    . Omega-3 1000 MG CAPS Take 1 capsule by mouth daily.     No current facility-administered medications on file prior to visit.        Objective:   Vitals:   01/26/18 0902  BP: 120/80  Pulse: 79  Temp: 97.8 F (36.6 C)  SpO2: 99%     Wt Readings  from Last 3 Encounters:  01/26/18 130 lb (59 kg)  01/22/18 128 lb 6.4 oz (58.2 kg)  12/20/17 138 lb (62.6 kg)    Physical Exam:   General Appearance:  Sitting comfortably on examination table, cooperative, appears stated age. In no acute distress. Afebrile.  Head:  Normocephalic, without obvious abnormality, atraumatic  Back:   No  CVA tenderness with percussion/palpation.  Lungs:   Clear to auscultation bilaterally, respirations unlabored  Heart:  Regular rate and rhythm, S1 and S2 normal, no murmur, rub, or gallop  Abdomen:   Soft, bowel sounds active all four quadrants, no masses, no organomegaly. No tenderness with palpation of abdomen.  Extremities: Extremities normal, atraumatic, no cyanosis or edema  Pulses: 2+ and symmetric  Skin: Skin color, texture, turgor normal, no rashes or lesions  Lymph nodes: Cervical, supraclavicular, and axillary nodes normal  Neurologic: Normal    Assessment & Plan:    Exam findings, diagnosis etiology and medication use and indications reviewed with patient. Follow-Up and discharge instructions provided. No emergent/urgent issues found on exam.  Patient education was provided.   Patient verbalized understanding of information provided and agrees with plan of care (POC), all questions answered. The patient is advised to call or return to clinic if condition does not see an improvement in symptoms, or to seek the care of the closest emergency department if condition worsens with the below plan.    Urinalysis performed in office/clinic, revealed small blood, no nitrites, and no leukocytes. No orange discoloration and no glucose.  1. Increased urinary frequency  - nitrofurantoin, macrocrystal-monohydrate, (MACROBID) 100 MG capsule; Take 1 capsule (100 mg total) by mouth 2 (two) times daily for 5 days.  Dispense: 10 capsule; Refill: 0 - phenazopyridine (PYRIDIUM) 200 MG tablet; Take 1 tablet (200 mg total) by mouth 3 (three) times daily as needed for pain.  Dispense: 15 tablet; Refill: 0  2. Dysuria  - POCT Urinalysis Dipstick  Patient with one day history of suprapubic pressure/discomfort, increased urinary frequency, and constant sensation of needing to void. Benign UA; however, specific gravity <1.005. Possible symptoms simply secondary irritation from pessary use. However,  symptoms also consistent with UTI (increased likelihood with pessary as well). Will treat empirically with 5-day course of Macrobid. Provided refill of Pyridium. Advised patient follow-up with PCP or Ob/Gyn if symptoms do not resolve in 2-3 days; may need further discussion of pessary use, other treatment modalities for stress incontinence, and urine sent for culture.   Janalyn HarderSamantha Lashya Passe, MHS, PA-C Rulon SeraSamantha F. Stevin Bielinski, MHS, PA-C Advanced Practice Provider [facility]  [facility]  [address], [facility], 1st Floor [place], [place] [address] (p):  [phone] [email or web address]

## 2018-01-28 ENCOUNTER — Telehealth: Payer: Self-pay | Admitting: Emergency Medicine

## 2018-01-28 NOTE — Telephone Encounter (Signed)
Left message follow up call from visit with [facility]. 

## 2018-01-28 NOTE — Telephone Encounter (Signed)
Patient returned call and stated that she is doing much better.

## 2018-02-01 ENCOUNTER — Ambulatory Visit
Admission: RE | Admit: 2018-02-01 | Discharge: 2018-02-01 | Disposition: A | Discharge status: Home / Self Care | Payer: No Typology Code available for payment source | Source: Ambulatory Visit | Attending: Internal Medicine | Admitting: Internal Medicine

## 2018-02-01 DIAGNOSIS — Z1231 Encounter for screening mammogram for malignant neoplasm of breast: Secondary | ICD-10-CM | POA: Diagnosis not present

## 2018-02-10 ENCOUNTER — Encounter: Payer: Self-pay | Admitting: Physician Assistant

## 2018-02-10 ENCOUNTER — Ambulatory Visit (INDEPENDENT_AMBULATORY_CARE_PROVIDER_SITE_OTHER): Payer: Self-pay | Admitting: Physician Assistant

## 2018-02-10 VITALS — BP 110/80 | HR 81 | Temp 98.8°F | Wt 129.0 lb

## 2018-02-10 DIAGNOSIS — R05 Cough: Secondary | ICD-10-CM

## 2018-02-10 DIAGNOSIS — R059 Cough, unspecified: Secondary | ICD-10-CM

## 2018-02-10 DIAGNOSIS — J069 Acute upper respiratory infection, unspecified: Secondary | ICD-10-CM

## 2018-02-10 MED ORDER — BENZONATATE 200 MG PO CAPS: 200.0000 mg | ORAL_CAPSULE | Freq: Two times a day (BID) | ORAL | 0 refills | Status: DC | PRN

## 2018-02-10 MED ORDER — DEXTROMETHORPHAN HBR 15 MG/5ML PO SYRP: 10.0000 mL | ORAL_SOLUTION | Freq: Four times a day (QID) | ORAL | 0 refills | Status: DC | PRN

## 2018-02-10 NOTE — Progress Notes (Signed)
Patient ID: Heather Watson DOB: [birth year] AGE: 62 y.o. MRN: [id number]   PCP: Sherlene Shamsullo, Teresa L, MD   Chief Complaint:  Chief Complaint  Patient presents with  . Cough    x3d     Subjective:    HPI:  Heather Watson is a 62 y.o. female presents for evaluation  Chief Complaint  Patient presents with  . Cough    x153d    62 year old [facility] with 4 day history of URI symptoms. Began with hoarse voice. Then developed dry cough. Dry cough rare during the day. Most severe at night. Propping self up with two pillows to sleep. Has used Chloraseptic throat lozenges, Mucinex-DM, and Vitamin C with no improvement. Cough not productive. Associated headache and fatigue. Denies fever, chills, ear pain, nasal congestion, sinus pain, sore throat, chest pain, SOB, wheezing. Patient denies asthma history. No COPD or emphysema diagnosis. Former cigarette smoker; quit 1983. Patient states family members currently sick with similar symptoms.  A limited review of symptoms was performed, pertinent positives and negatives as mentioned in HPI.  The following portions of the patient's history were reviewed and updated as appropriate: allergies, current medications and past medical history.  Patient Active Problem List   Diagnosis Date Noted  . Vitamin D deficiency 01/17/2017  . Colon cancer screening   . Family history of colonic polyps 01/19/2016  . Chronic right SI joint pain 01/19/2016  . History of meniscal tear 02/02/2014  . 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 Encounter for preventive health examination 01/08/2013  . Menopause 05/14/2012  . 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 H/O thyroid cyst 05/14/2012    Allergies  Allergen Reactions  . Iodine Rash and Itching    Contrast through IV    Current Outpatient Medications on File Prior to Visit  Medication Sig Dispense Refill  . calcium gluconate 500 MG tablet Take 1 tablet by  mouth daily.    . Cholecalciferol (VITAMIN D) 2000 UNITS tablet Take 2,000 Units by mouth daily. Reported on 02/27/2015    . conjugated estrogens (PREMARIN) vaginal cream INSERT 1 GRAM VAGINALLY AT NIGHT TWICE A WEEK. 30 g 0  . Glucosamine-Chondroitin 500-400 MG CAPS Take 1 capsule by mouth daily.    . magnesium gluconate (MAGONATE) 500 MG tablet Take 800 mg by mouth daily.    . Omega-3 1000 MG CAPS Take 1 capsule by mouth daily.    . phenazopyridine (PYRIDIUM) 200 MG tablet Take 1 tablet (200 mg total) by mouth 3 (three) times daily as needed for pain. 15 tablet 0   No current facility-administered medications on file prior to visit.        Objective:   Vitals:   02/10/18 1009  BP: 110/80  Pulse: 81  Temp: 98.8 F (37.1 C)  SpO2: 97%     Wt Readings from Last 3 Encounters:  02/10/18 129 lb (58.5 kg)  01/26/18 130 lb (59 kg)  01/22/18 128 lb 6.4 oz (58.2 kg)    Physical Exam:   General Appearance:  Patient sitting comfortably on examination table. Conversational. Peri JeffersonGood self-historian. In no acute distress. Afebrile.   Head:  Normocephalic, without obvious abnormality, atraumatic  Eyes:  PERRL, conjunctiva/corneas clear, EOM's intact  Ears:  Bilateral ear canals WNL. No erythema or edema. No discharge/drainage. Bilateral TMs WNL. No erythema, injection, or serous effusion. No scar tissue.  Nose: Nares normal, septum midline. No discharge. Normal mucosa. No sinus tenderness with percussion/palpation.  Throat: Lips,  mucosa, and tongue normal; teeth and gums normal. Throat reveals no erythema. Tonsils with no enlargement or exudate.  Neck: Supple, symmetrical, trachea midline, no adenopathy  Lungs:   Clear to auscultation bilaterally, respirations unlabored. Good aeration. No wheezing, rales, rhonchi, or crackles. No cough during examination.  Heart:  Regular rate and rhythm, S1 and S2 normal, no murmur, rub, or gallop  Extremities: Extremities normal, atraumatic, no cyanosis or  edema  Pulses: 2+ and symmetric  Skin: Skin color, texture, turgor normal, no rashes or lesions  Lymph nodes: Cervical, supraclavicular, and axillary nodes normal  Neurologic: Normal    Assessment & Plan:    Exam findings, diagnosis etiology and medication use and indications reviewed with patient. Follow-Up and discharge instructions provided. No emergent/urgent issues found on exam.  Patient education was provided.   Patient verbalized understanding of information provided and agrees with plan of care (POC), all questions answered. The patient is advised to call or return to clinic if condition does not see an improvement in symptoms, or to seek the care of the closest emergency department if condition worsens with the below plan.   1. Upper respiratory tract infection, unspecified type  2. Cough  - benzonatate (TESSALON) 200 MG capsule; Take 1 capsule (200 mg total) by mouth 2 (two) times daily as needed for cough.  Dispense: 20 capsule; Refill: 0 - dextromethorphan 15 MG/5ML syrup; Take 10 mLs (30 mg total) by mouth 4 (four) times daily as needed for cough.  Dispense: 120 mL; Refill: 0  Patient with 4 day history of URI symptoms; primary concern dry cough. Prescribed Tussin cough syrup and Tessalon Perles. Advised re-evaluation in 4-5 days if not improving; sooner with any worsening symptoms such as fever, chest pain, shortness of breath, etc. Patient agrees with plan.   Janalyn HarderSamantha Candi Profit, MHS, PA-C Rulon SeraSamantha F. Elsi Stelzer, MHS, PA-C Advanced Practice Provider [facility]  [facility]  [address], [facility], 1st Floor [place], [place] [address] (p):  [phone] [email or web address]

## 2018-02-10 NOTE — Patient Instructions (Signed)
Thank you for choosing [facility] for your health care needs.  You have been diagnosed with an upper respiratory infection (a cold) with associated dry cough.  Recommend increase fluids; water, Gatorade, hot tea with lemon/honey, or orange juice. Rest. Use several pillows to prop self up at night, will help with cough. May use cool mist humidifier, will help with cough.  You have been prescribed cough syrup and cough suppressant pills.  [facility] or follow-up with family physician or urgent care if symptoms not improving in 4-5 days. Sooner with any worsening symptoms such as chest pain, wheezing, shortness of breath, etc.  Cough, Adult  A cough helps to clear your throat and lungs. A cough may last only 2-3 weeks (acute), or it may last longer than 8 weeks (chronic). Many different things can cause a cough. A cough may be a sign of an illness or another medical condition. Follow these instructions at home:  Pay attention to any changes in your cough.  Take medicines only as told by your doctor. ? If you were prescribed an antibiotic medicine, take it as told by your doctor. Do not stop taking it even if you start to feel better. ? Talk with your doctor before you try using a cough medicine.  Drink enough fluid to keep your pee (urine) clear or pale yellow.  If the air is dry, use a cold steam vaporizer or humidifier in your home.  Stay away from things that make you cough at work or at home.  If your cough is worse at night, try using extra pillows to raise your head up higher while you sleep.  Do not smoke, and try not to be around smoke. If you need help quitting, ask your doctor.  Do not have caffeine.  Do not drink alcohol.  Rest as needed. Contact a doctor if:  You have new problems (symptoms).  You cough up yellow fluid (pus).  Your cough does not get better after 2-3 weeks, or your cough gets worse.  Medicine does not help your cough and you are not  sleeping well.  You have pain that gets worse or pain that is not helped with medicine.  You have a fever.  You are losing weight and you do not know why.  You have night sweats. Get help right away if:  You cough up blood.  You have trouble breathing.  Your heartbeat is very fast. This information is not intended to replace advice given to you by your health care provider. Make sure you discuss any questions you have with your health care provider. Document Released: 10/16/2010 Document Revised: 07/11/2015 Document Reviewed: 04/11/2014 Elsevier Interactive Patient Education  2019 [employer]

## 2018-02-15 ENCOUNTER — Telehealth: Payer: Self-pay | Admitting: Emergency Medicine

## 2018-02-15 NOTE — Telephone Encounter (Signed)
Left message follow up call from visit with [facility]. 

## 2018-03-02 ENCOUNTER — Other Ambulatory Visit: Payer: Self-pay | Admitting: Internal Medicine

## 2018-03-03 NOTE — Telephone Encounter (Signed)
Looks like this medication was discontinued on 01/18/2017 because pt stated she was not taking any more.  Last OV: 01/20/2018 Next OV: 01/23/2019

## 2018-03-07 ENCOUNTER — Other Ambulatory Visit: Payer: Self-pay | Admitting: Physician Assistant

## 2018-03-07 ENCOUNTER — Telehealth: Payer: Self-pay

## 2018-03-07 ENCOUNTER — Ambulatory Visit
Admission: RE | Admit: 2018-03-07 | Discharge: 2018-03-07 | Disposition: A | Discharge status: Home / Self Care | Payer: No Typology Code available for payment source | Source: Ambulatory Visit | Attending: Physician Assistant | Admitting: Physician Assistant

## 2018-03-07 DIAGNOSIS — S52122A Displaced fracture of head of left radius, initial encounter for closed fracture: Secondary | ICD-10-CM | POA: Insufficient documentation

## 2018-03-07 DIAGNOSIS — S42409A Unspecified fracture of lower end of unspecified humerus, initial encounter for closed fracture: Secondary | ICD-10-CM

## 2018-03-07 DIAGNOSIS — S52121A Displaced fracture of head of right radius, initial encounter for closed fracture: Secondary | ICD-10-CM

## 2018-03-07 NOTE — Telephone Encounter (Signed)
Copied from CRM [id number]. Topic: Referral - Request for Referral >> Mar 07, 2018  9:45 AM Gean Birchwood R wrote: Has patient seen PCP for this complaint? No Referral for which specialty: Orthopedics Preferred provider/office: n/a Reason for referral: Fractured elbow was seen at [facility] but was told by insurance to contact PCP

## 2018-03-07 NOTE — Telephone Encounter (Signed)
See previous message

## 2018-03-07 NOTE — Telephone Encounter (Signed)
Copied from CRM [id number]. Topic: Referral - Request for Referral >> Mar 07, 2018  9:45 AM Gean Birchwood R wrote: Has patient seen PCP for this complaint? No Referral for which specialty: Orthopedics Preferred provider/office: n/a Reason for referral: Fractured elbow was seen at [facility] but was told by insurance to contact PCP >> Mar 07, 2018  3:55 PM Jaquita Rector A wrote: Patient called to say please disregard this request. She spoke with the insurance company and now have an appointment with [facility] on 03/08/2018 and wanted to inform Dr Darrick Huntsman. Dr Donnald Garre will be tending to her

## 2018-03-07 NOTE — Telephone Encounter (Signed)
Patient is calling to check on the status of her referral. Patient would like a call back. Please advise thank you

## 2018-03-07 NOTE — Telephone Encounter (Signed)
Spoke with pt and she stated that she spoke with her insurance company this morning and they stated that the pt does not need a referral from her PCP is just needed to notifiy her PCP that she was seen by [facility]. Pt also stated that the hospital called her just a little bit a go and stated that Dr. Artis Flock has ordered her a CT scan to be done today.

## 2018-03-07 NOTE — Telephone Encounter (Signed)
Requesting a referral from PCP, per insurance, for orthopedic. Pt was seen at [facility] for a fractured elbow.

## 2018-03-08 ENCOUNTER — Other Ambulatory Visit: Payer: Self-pay | Admitting: Physician Assistant

## 2018-03-08 DIAGNOSIS — S42122A Displaced fracture of acromial process, left shoulder, initial encounter for closed fracture: Secondary | ICD-10-CM

## 2018-03-08 DIAGNOSIS — S52122A Displaced fracture of head of left radius, initial encounter for closed fracture: Secondary | ICD-10-CM

## 2018-03-09 ENCOUNTER — Other Ambulatory Visit: Payer: Self-pay

## 2018-03-09 ENCOUNTER — Telehealth: Payer: Self-pay | Admitting: Internal Medicine

## 2018-03-09 ENCOUNTER — Telehealth: Payer: Self-pay

## 2018-03-09 ENCOUNTER — Ambulatory Visit: Payer: No Typology Code available for payment source | Attending: Orthopaedic Surgery | Admitting: Occupational Therapy

## 2018-03-09 ENCOUNTER — Encounter: Payer: Self-pay | Admitting: Occupational Therapy

## 2018-03-09 DIAGNOSIS — M25622 Stiffness of left elbow, not elsewhere classified: Secondary | ICD-10-CM | POA: Insufficient documentation

## 2018-03-09 DIAGNOSIS — M6281 Muscle weakness (generalized): Secondary | ICD-10-CM | POA: Insufficient documentation

## 2018-03-09 DIAGNOSIS — M25522 Pain in left elbow: Secondary | ICD-10-CM | POA: Insufficient documentation

## 2018-03-09 DIAGNOSIS — G5601 Carpal tunnel syndrome, right upper limb: Secondary | ICD-10-CM | POA: Insufficient documentation

## 2018-03-09 NOTE — Therapy (Signed)
[place] [facility] [address], [place], [address] Phone: [phone]   Fax:  [phone]  Occupational Therapy Evaluation  Patient Details  Name: Heather Watson MRN: [id number] Date of Birth: [birth year] Referring Provider (OT): Mathis Bud   Encounter Date: 03/09/2018  OT End of Session - 03/09/18 2012    Visit Number  1    Number of Visits  6    Date for OT Re-Evaluation  04/20/18    OT Start Time  1531    OT Stop Time  1630    OT Time Calculation (min)  59 min    Behavior During Therapy  [facility] for tasks assessed/performed       Past Medical History:  Diagnosis Date  . Gallstones   . UTI (urinary tract infection)     Past Surgical History:  Procedure Laterality Date  . COLONOSCOPY WITH PROPOFOL N/A 05/19/2016   Procedure: COLONOSCOPY WITH PROPOFOL;  Surgeon: Midge Minium, MD;  Location: [facility] ENDOSCOPY;  Service: Endoscopy;  Laterality: N/A;    There were no vitals filed for this visit.  Subjective Assessment - 03/09/18 2004    Subjective   I do not want surgery - never before broke anything or had surgery - but what  I don't understand is that I can have sling off at home but work have it on for 8 hrs - my elbow is going to be stiff     Patient Stated Goals  I want my motion and strength back in my L elbow so I can workout and do what I did before - I do not want surgery     Currently in Pain?  No/denies   sitting    Pain Score  3    with AROM of elbow        [facility] OT Assessment - 03/09/18 0001      Assessment   Medical Diagnosis  L elbow neck fx    Referring Provider (OT)  soria- hernandez    Onset Date/Surgical Date  03/06/18    Hand Dominance  Right    Next MD Visit  --   next Tues     Precautions   Required Braces or Orthoses  --   sling on when at work      [employer]   Lives With  Spouse      Prior Function   Vocation  Full time employment    Leisure  spanish  interpreter, work out in gym, hike ,       AROM   Right Elbow Flexion  146    Right Elbow Extension  0    Left Elbow Flexion  135   in session 140   Left Elbow Extension  -18   in session -8    Left Forearm Pronation  90 Degrees    Left Forearm Supination  90 Degrees   2/10 pain        AROM for elbow flexion and extention - alternating and also alternating in sup /pro - elbow 3 positions - against wall  10 reps  2 x day - good sessions at home with heat prior  But then at work come out of sling 3 x - and do light AROM in all planes  Keep pull or pain less than 2/10   Ice at the end                 OT Education -  03/09/18 2011    Education Details  findings of eval , ROM and review HEP - reinforce to to push herself - pt has high pain tolerance and can push herself     Person(s) Educated  Patient    Methods  Explanation;Demonstration;Handout    Comprehension  Verbalized understanding;Returned demonstration       OT Short Term Goals - 03/09/18 2019      OT SHORT TERM GOAL #1   Title  Pt to be ind in HEP to increase L elbow flexion and extention  to WNL with pain less than 2/10    Baseline  sup /pro WNL - flexion 135 and extention -18     Time  2    Period  Weeks    Status  New    Target Date  03/23/18        OT Long Term Goals - 03/09/18 2020      OT LONG TERM GOAL #1   Title  Pt show AROM in L arm WNL to perfrom ADL's including blow dry her hair , earrings and dental hygiene    Baseline  see flowsheet- and trouble with ADL's act above     Time  4    Period  Weeks    Status  New    Target Date  04/06/18      OT LONG TERM GOAL #2   Title  upgrade to strength goal if indicated or needed     Baseline  no strengtening yet     Time  4    Period  Weeks    Status  New    Target Date  04/06/18            Plan - 03/09/18 2012    Clinical Impression Statement  Pt present at OT eval 3 days out from Acute, closed, slightly depressed fracture of  the radial head - pt was seen by ortho order AROM and PROM for L elbow flexion , extention and sup /pro - pt in eval report her motion already improve since Sunday -  pt show  great progress in session with only AROM - pt is known to this OT and can push herself  and high pain tolerance - reinforce for pt to do 2 good sessions a day and take sling off 3 x at work and do AROM thru all planes - pt had normal sup /pro - but liimited in flexion more than extention - limiting her functional use - pain at the most in session was 3/10 but pt took pain meds about 3 hrs ago - pt to use ice as needed - will follow up with her tomorrow again -and pt has appt with ortho next week     Occupational performance deficits (Please refer to evaluation for details):  ADL's;IADL's;Leisure;Play    Rehab Potential  Excellent    Current Impairments/barriers affecting progress:  pt high pain tolerance and can push herself - perfect motion     OT Frequency  2x / week   1-2 x wk   OT Duration  6 weeks    OT Treatment/Interventions  Self-care/ADL training;Cryotherapy;Moist Heat;Passive range of motion;Patient/family education;Therapeutic exercise    Plan  assess progress in ROM and pain     Clinical Decision Making  Several treatment options, min-mod task modification necessary    OT Home Exercise Plan  see pt instruction     Consulted and Agree with Plan of Care  Patient  Patient will benefit from skilled therapeutic intervention in order to improve the following deficits and impairments:  Decreased knowledge of precautions, Pain, Impaired UE functional use, Impaired flexibility, Decreased range of motion  Visit Diagnosis: Stiffness of left elbow, not elsewhere classified - Plan: Ot plan of care cert/re-cert  Pain in left elbow - Plan: Ot plan of care cert/re-cert  Muscle weakness (generalized) - Plan: Ot plan of care cert/re-cert    Problem List Patient Active Problem List   Diagnosis Date Noted  . Vitamin D  deficiency 01/17/2017  . Colon cancer screening   . Family history of colonic polyps 01/19/2016  . Chronic right SI joint pain 01/19/2016  . History of meniscal tear 02/02/2014  . 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 Encounter for preventive health examination 01/08/2013  . Menopause 05/14/2012  . 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 H/O thyroid cyst 05/14/2012    Oletta CohnuPreez, Delno Blaisdell OTR/L,CLT 03/09/2018, 8:25 PM  [facility] [address] [place], [place], [address] Phone: [phone]   Fax:  [phone]  Name: Heather Watson MRN: [id number] Date of Birth: Jun 27, 1955

## 2018-03-09 NOTE — Telephone Encounter (Signed)
Heather Watson,  See patient message,  Already seen emergently by [facility],  But insruacne requires referral from [place]  Same for PT,  First session is this afternoon

## 2018-03-09 NOTE — Addendum Note (Signed)
Addended by: Sherlene Shams on: 03/09/2018 12:43 PM   Modules accepted: Orders

## 2018-03-09 NOTE — Patient Instructions (Signed)
AROM for elbow flexion and extention - alternating and also alternating in sup /pro - elbow 3 positions - against wall  10 reps  2 x day - good sessions at home with heat prior  But then at work come out of sling 3 x - and do light AROM in all planes  Keep pull or pain less than 2/10

## 2018-03-09 NOTE — Telephone Encounter (Signed)
Copied from CRM #[id number]. Topic: Referral - Request for Referral >> Mar 07, 2018  9:45 AM Williams-Neal, Sade R wrote: Has patient seen PCP for this complaint? No Referral for which specialty: Orthopedics Preferred provider/office: n/a Reason for referral: Fractured elbow was seen at [facility] but was told by insurance to contact PCP >> Mar 07, 2018  3:55 PM Davis, Karen A wrote: Patient called to say please disregard this request. She spoke with the insurance company and now have an appointment with [facility] on 03/08/2018 and wanted to inform Dr Tullo. Dr Alexia Soria will be tending to her >> Mar 09, 2018  8:26 AM Ortiz, Kristie S wrote: Pt went to [facility] 03/08/2018 and saw Dr Alexia Soria. She does need Dr. Tullo to send Referral since she have [employer] thru [place] or they will not cover as "coordinated" visit for payment. Pt was seen for frx elbow on left arm. 

## 2018-03-09 NOTE — Telephone Encounter (Signed)
Copied from CRM [id number]. Topic: Referral - Request for Referral >> Mar 07, 2018  9:45 AM Gean Birchwood R wrote: Has patient seen PCP for this complaint? No Referral for which specialty: Orthopedics Preferred provider/office: n/a Reason for referral: Fractured elbow was seen at [facility] but was told by insurance to contact PCP >> Mar 07, 2018  3:55 PM Jaquita Rector A wrote: Patient called to say please disregard this request. She spoke with the insurance company and now have an appointment with [facility] on 03/08/2018 and wanted to inform Dr Darrick Huntsman. Dr Donnald Garre will be tending to her >> Mar 09, 2018  8:26 AM Maia Petties wrote: Pt went to [facility] 03/08/2018 and saw Dr Donnald Garre. She does need Dr. Darrick Huntsman to send Referral since she have [employer] thru [facility] or they will not cover as "coordinated" visit for payment. Pt was seen for frx elbow on left arm.

## 2018-03-09 NOTE — Telephone Encounter (Signed)
Pt went to [facility] 03/08/2018 and saw Dr Donnald Garre. She does need Dr. Darrick Huntsman to send Referral since she have [employer] thru [facility] or they will not cover as "coordinated" visit for payment. Pt was seen for frx elbow on left arm.  Ortho referred her to occupational therapy at [facility]. She is needing a referral sent to them as well for "coordinated" coverage. She has appt today at 3:30pm.

## 2018-03-09 NOTE — Telephone Encounter (Signed)
Please see previous message

## 2018-03-09 NOTE — Telephone Encounter (Signed)
A referral is in the patient's chart . She is to contact her insurance company to submit the physician's name for the outgoing referral. I tried to contact the patient to inform her.

## 2018-03-10 ENCOUNTER — Ambulatory Visit: Payer: No Typology Code available for payment source | Admitting: Occupational Therapy

## 2018-03-10 DIAGNOSIS — M6281 Muscle weakness (generalized): Secondary | ICD-10-CM

## 2018-03-10 DIAGNOSIS — M25622 Stiffness of left elbow, not elsewhere classified: Secondary | ICD-10-CM | POA: Diagnosis not present

## 2018-03-10 DIAGNOSIS — M25522 Pain in left elbow: Secondary | ICD-10-CM

## 2018-03-10 NOTE — Patient Instructions (Signed)
Same HEP - can do in supine as option

## 2018-03-10 NOTE — Telephone Encounter (Signed)
I will reach out to her regarding this. All we (you) have to do is enter the referral. SHE will have to contact her insurance for the referral.

## 2018-03-10 NOTE — Therapy (Signed)
[place] [facility]IC[facility] [address] [place], [address] Phone: [phone]   Fax:  [phone]  Occupational Therapy Treatment  Patient Details  Name: Heather Watson MRN: [id number] Date of Birth: [birth year] Referring Provider (OT): Mathis Bud   Encounter Date: 03/10/2018  OT End of Session - 03/10/18 2000    Visit Number  2    Number of Visits  6    Date for OT Re-Evaluation  04/20/18    OT Start Time  1501    OT Stop Time  1546    OT Time Calculation (min)  45 min    Activity Tolerance  Treatment limited secondary to medical complications (Comment)    Behavior During Therapy  [facility] for tasks assessed/performed       Past Medical History:  Diagnosis Date  . Gallstones   . UTI (urinary tract infection)     Past Surgical History:  Procedure Laterality Date  . COLONOSCOPY WITH PROPOFOL N/A 05/19/2016   Procedure: COLONOSCOPY WITH PROPOFOL;  Surgeon: Midge Minium, MD;  Location: [facility] ENDOSCOPY;  Service: Endoscopy;  Laterality: N/A;    There were no vitals filed for this visit.  Subjective Assessment - 03/10/18 1957    Subjective   I did my exercises good in the morning and about 3 more times at work with some ice afterwards- did not take painmeds since 9 am     Patient Stated Goals  I want my motion and strength back in my L elbow so I can workout and do what I did before - I do not want surgery     Currently in Pain?  No/denies   at rest -and pull about 1-2/10 with ROM         [facility] OT Assessment - 03/10/18 0001      AROM   Left Elbow Flexion  140    Left Elbow Extension  -6   0 afterheat and in supine   Left Forearm Pronation  90 Degrees    Left Forearm Supination  90 Degrees        moist heat done over elbow at [facility] in extention of elbow  Some light massage over distal bicep  Prior to supine done AROM and AAROM for elbow in all 3 positions in combination of sup /pro  Pt can do HEP   Against wall or in supine   but need to keep shoulder back and aim for wrist to mat or wall - no fingers  Cont to not over do and use ice                OT Education - 03/10/18 1959    Education Details  progress , ROM and review HEP - reinforce to not push herself - pt has high pain tolerance and can push herself     Person(s) Educated  Patient    Methods  Explanation;Demonstration;Handout    Comprehension  Verbalized understanding;Returned demonstration       OT Short Term Goals - 03/09/18 2019      OT SHORT TERM GOAL #1   Title  Pt to be ind in HEP to increase L elbow flexion and extention  to WNL with pain less than 2/10    Baseline  sup /pro WNL - flexion 135 and extention -18     Time  2    Period  Weeks    Status  New    Target Date  03/23/18  OT Long Term Goals - 03/09/18 2020      OT LONG TERM GOAL #1   Title  Pt show AROM in L arm WNL to perfrom ADL's including blow dry her hair , earrings and dental hygiene    Baseline  see flowsheet- and trouble with ADL's act above     Time  4    Period  Weeks    Status  New    Target Date  04/06/18      OT LONG TERM GOAL #2   Title  upgrade to strength goal if indicated or needed     Baseline  no strengtening yet     Time  4    Period  Weeks    Status  New    Target Date  04/06/18            Plan - 03/10/18 2000    Clinical Impression Statement  Pt is 4 days out from acute, closed , slightly depressed fx of L radial head - pt show great progress in AROM and pain is about 1-2/10 pull or stretch - pt able to show increase functional progress in reaching behind head, earrings and doing hair - sup /pro WNL , and wrist extention 0 to -6 degrees and flexion 140 in session this date     Occupational performance deficits (Please refer to evaluation for details):  ADL's;IADL's;Leisure;Play    Rehab Potential  Excellent    Current Impairments/barriers affecting progress:  pt high pain tolerance and can push  herself - perfect motion     OT Frequency  --   1-2x wks   OT Duration  6 weeks    OT Treatment/Interventions  Self-care/ADL training;Cryotherapy;Moist Heat;Passive range of motion;Patient/family education;Therapeutic exercise    Plan  assess progress in ROM and pain     Clinical Decision Making  Several treatment options, min-mod task modification necessary    OT Home Exercise Plan  see pt instruction     Consulted and Agree with Plan of Care  Patient       Patient will benefit from skilled therapeutic intervention in order to improve the following deficits and impairments:  Decreased knowledge of precautions, Pain, Impaired UE functional use, Impaired flexibility, Decreased range of motion  Visit Diagnosis: Stiffness of left elbow, not elsewhere classified  Pain in left elbow  Muscle weakness (generalized)    Problem List Patient Active Problem List   Diagnosis Date Noted  . Vitamin D deficiency 01/17/2017  . Colon cancer screening   . Family history of colonic polyps 01/19/2016  . Chronic right SI joint pain 01/19/2016  . History of meniscal tear 02/02/2014  . 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 Encounter for preventive health examination 01/08/2013  . Menopause 05/14/2012  . 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 H/O thyroid cyst 05/14/2012    Oletta Cohn OTR/L,CLT 03/10/2018, 8:06 PM   [facility] SPORTS MEDICINE [address] [place], [address] Phone: [phone]   Fax:  [phone]  Name: Heather Watson MRN: [id number] Date of Birth: [birth year]

## 2018-03-15 ENCOUNTER — Ambulatory Visit: Payer: No Typology Code available for payment source | Admitting: Occupational Therapy

## 2018-03-15 DIAGNOSIS — M25622 Stiffness of left elbow, not elsewhere classified: Secondary | ICD-10-CM | POA: Diagnosis not present

## 2018-03-15 DIAGNOSIS — M25522 Pain in left elbow: Secondary | ICD-10-CM

## 2018-03-15 DIAGNOSIS — M6281 Muscle weakness (generalized): Secondary | ICD-10-CM

## 2018-03-15 NOTE — Therapy (Signed)
[place] [facility]TS [facility]ICINE [address] [place], [address] Phone: [phone]   Fax:  [phone]  Occupational Therapy Treatment  Patient Details  Name: Heather Watson MRN: [id number] Date of Birth: 13-Jan-1956 Referring Provider (OT): Mathis Bud   Encounter Date: 03/15/2018  OT End of Session - 03/15/18 1955    Visit Number  3    Number of Visits  6    Date for OT Re-Evaluation  04/20/18    OT Start Time  1233    OT Stop Time  1316    OT Time Calculation (min)  43 min    Activity Tolerance  Treatment limited secondary to medical complications (Comment)    Behavior During Therapy  [facility] for tasks assessed/performed       Past Medical History:  Diagnosis Date  . Gallstones   . UTI (urinary tract infection)     Past Surgical History:  Procedure Laterality Date  . COLONOSCOPY WITH PROPOFOL N/A 05/19/2016   Procedure: COLONOSCOPY WITH PROPOFOL;  Surgeon: Midge Minium, MD;  Location: [facility] ENDOSCOPY;  Service: Endoscopy;  Laterality: N/A;    There were no vitals filed for this visit.  Subjective Assessment - 03/15/18 1951    Subjective   I did get good ice pack - Doing okay - but exercises take time , and I could not go hike this past weekend - I did not take any painmeds since Friday - I can touch my ear on R and behind my head     Patient Stated Goals  I want my motion and strength back in my L elbow so I can workout and do what I did before - I do not want surgery     Currently in Pain?  No/denies         [facility] OT Assessment - 03/15/18 0001      AROM   Left Elbow Flexion  140    Left Elbow Extension  -6   0 in session   Left Forearm Pronation  90 Degrees    Left Forearm Supination  90 Degrees         assess AROM for elbow and sup/pro - pt report she did not take any pain meds since Friday  Asking about helping with R hand for elbow flexion - did not add that last week - because of pt - is " that  can push herself - and did not want her to over do things " feels like this week ready - off pain meds and showed last week progress with AROM alone       OT Treatments/Exercises (OP) - 03/15/18 0001      Moist Heat Therapy   Number Minutes Moist Heat  8 Minutes    Moist Heat Location  Elbow      Cryotherapy   Number Minutes Cryotherapy  10 Minutes    Cryotherapy Location  --   elbow    Type of Cryotherapy  Ice pack        moist heat done over elbow at [facility] [place] [facility] in extention of elbow  In supine - pt able to relax shoulder better  Can do light weight on hand for end stretch if needed  Some light massage over distal bicep - pt can do  Pt can do interlocking fingers for AAROM using R hand to pull L elbow flexion to elbow ear and behind head to neck line  Hold 3-5 sec  10 reps  add to HEP  Can do after that her AROM  for elbow in all 3 positions in combination of sup /pro  Pt can do HEP  Against wall or in supine   but need to keep shoulder back and aim for wrist to mat or wall - no fingers  Cont to not over do and use ice         OT Education - 03/15/18 1954    Education Details  progress , ROM and review HEP -add AAROM for elbow flexion and bicep tendon massage     Person(s) Educated  Patient    Methods  Explanation;Demonstration;Handout    Comprehension  Verbalized understanding;Returned demonstration       OT Short Term Goals - 03/09/18 2019      OT SHORT TERM GOAL #1   Title  Pt to be ind in HEP to increase L elbow flexion and extention  to WNL with pain less than 2/10    Baseline  sup /pro WNL - flexion 135 and extention -18     Time  2    Period  Weeks    Status  New    Target Date  03/23/18        OT Long Term Goals - 03/09/18 2020      OT LONG TERM GOAL #1   Title  Pt show AROM in L arm WNL to perfrom ADL's including blow dry her hair , earrings and dental hygiene    Baseline  see flowsheet- and trouble with ADL's act above     Time  4    Period   Weeks    Status  New    Target Date  04/06/18      OT LONG TERM GOAL #2   Title  upgrade to strength goal if indicated or needed     Baseline  no strengtening yet     Time  4    Period  Weeks    Status  New    Target Date  04/06/18            Plan - 03/15/18 1956    Clinical Impression Statement  Pt is 9 days out from acute , sligtly depressed fx of L radial head - pt show great progress in session AROM of elbow extnetion 0 degrees and elbow flexion 140 - and sup/pro WNL- add this date AAROM to elbow flexion  using R hand to assist and can do some massage to bicep tendon - pt doing good with pain -and not over doing - pt to see MD this date for repeat  xray     Occupational performance deficits (Please refer to evaluation for details):  ADL's;IADL's;Leisure;Play    Rehab Potential  Excellent    Current Impairments/barriers affecting progress:  pt high pain tolerance and can push herself - perfect motion     OT Frequency  --   1-2 x wk   OT Duration  --   5 wks    OT Treatment/Interventions  Self-care/ADL training;Cryotherapy;Moist Heat;Passive range of motion;Patient/family education;Therapeutic exercise    Plan  assess progress in ROM and pain- check results from MD appt , xray and new orders      Clinical Decision Making  Several treatment options, min-mod task modification necessary    OT Home Exercise Plan  see pt instruction     Consulted and Agree with Plan of Care  Patient       Patient will benefit from skilled  therapeutic intervention in order to improve the following deficits and impairments:  Decreased knowledge of precautions, Pain, Impaired UE functional use, Impaired flexibility, Decreased range of motion  Visit Diagnosis: Stiffness of left elbow, not elsewhere classified  Pain in left elbow  Muscle weakness (generalized)    Problem List Patient Active Problem List   Diagnosis Date Noted  . Vitamin D deficiency 01/17/2017  . Colon cancer screening   .  Family history of colonic polyps 01/19/2016  . Chronic right SI joint pain 01/19/2016  . History of meniscal tear 02/02/2014  . 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 Encounter for preventive health examination 01/08/2013  . Menopause 05/14/2012  . 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 H/O thyroid cyst 05/14/2012    Oletta CohnuPreez, Davisha L[place]icum OTR/L,CLT 03/15/2018, 8:01 PM  [place] [facility] [address] [place], [place], [address] Phone: [phone]   Fax:  [phone]  Name: Heather Watson MRN: [id number] Date of Birth: 09-07-1955

## 2018-03-15 NOTE — Patient Instructions (Signed)
Can initiate after heat some AAROM for elbow flexion to elbow ear and behind head - interlocking fingers and KEEP wrist straight  Followed by her AROM for elbow flexion and extention  And can do massage to bicep tendon

## 2018-03-17 ENCOUNTER — Ambulatory Visit: Payer: No Typology Code available for payment source | Admitting: Occupational Therapy

## 2018-03-17 DIAGNOSIS — M6281 Muscle weakness (generalized): Secondary | ICD-10-CM

## 2018-03-17 DIAGNOSIS — M25522 Pain in left elbow: Secondary | ICD-10-CM

## 2018-03-17 DIAGNOSIS — M25622 Stiffness of left elbow, not elsewhere classified: Secondary | ICD-10-CM | POA: Diagnosis not present

## 2018-03-17 DIAGNOSIS — G5601 Carpal tunnel syndrome, right upper limb: Secondary | ICD-10-CM

## 2018-03-17 NOTE — Therapy (Signed)
[place] [facility] PHYSICAL AND SPORTS MEDICINE [address] [place], [place], [id number] Phone: [phone]   Fax:  [phone]  Occupational Therapy Treatment  Patient Details  Name: Heather Watson Watson MRN: [id number] Date of Birth: [birth year] Referring Provider (OT): Mathis Budsoria- hernandez   Encounter Date: 03/17/2018  OT End of Session - 03/17/18 1918    Visit Number  4    Number of Visits  6    Date for OT Re-Evaluation  04/20/18    OT Start Time  1647    OT Stop Time  1745    OT Time Calculation (min)  58 min    Activity Tolerance  Patient tolerated treatment well    Behavior During Therapy  [facility] for tasks assessed/performed       Past Medical History:  Diagnosis Date  . Gallstones   . UTI (urinary tract infection)     Past Surgical History:  Procedure Laterality Date  . COLONOSCOPY WITH PROPOFOL N/A 05/19/2016   Procedure: COLONOSCOPY WITH PROPOFOL;  Surgeon: Midge Miniumarren Wohl, MD;  Location: [facility] ENDOSCOPY;  Service: Endoscopy;  Laterality: N/A;    There were no vitals filed for this visit.  Subjective Assessment - 03/17/18 1911    Subjective   I seen the MD - no change since last xray - was happy with my motion - did my exercises 5 x day - it takes about 50 min each time     Patient Stated Goals  I want my motion and strength back in my L elbow so I can workout and do what I did before - I do not want surgery     Currently in Pain?  No/denies          assess AROM for elbow and sup/pro - pt report she did not take any pain meds since last  Friday  Pt done some AAROM functionally this past week and show great progress in flexion  -           OT Treatments/Exercises (OP) - 03/17/18 0001      Moist Heat Therapy   Number Minutes Moist Heat  8 Minutes    Moist Heat Location  Elbow      Cryotherapy   Number Minutes Cryotherapy  10 Minutes    Cryotherapy Location  --   elbow   Type of Cryotherapy  Ice pack      soft tissue mobs to  bicep prior to ROM   [facility]  In supine - pt able to relax shoulder better  Pt do light weight on hand for end stretch if needed  Some light massage over distal bicep - pt can cont  Pt can cont to do interlocking fingers for AAROM using R hand to pull L elbow flexion to clavicle on L and behind head to neck line  Hold 3-5 sec  10 reps   Can do after that her AROM  for elbow in all 3 positions in combination of sup /pro  Pt can do HEP Against wall or in supine  but need to keep shoulder back and aim for wrist to mat or wall - no fingers  Cont to not over do and use ice Can decrease to 3 x day  And ice        OT Education - 03/17/18 1915    Education Details  progress , ROM review HEP - but still not  to over  do     Person(s) Educated  Patient    Methods  Explanation;Demonstration;Handout    Comprehension  Verbalized understanding;Returned demonstration       OT Short Term Goals - 03/09/18 2019      OT SHORT TERM GOAL #1   Title  Pt to be ind in HEP to increase L elbow flexion and extention  to WNL with pain less than 2/10    Baseline  sup /pro WNL - flexion 135 and extention -18     Time  2    Period  Weeks    Status  New    Target Date  03/23/18        OT Long Term Goals - 03/09/18 2020      OT LONG TERM GOAL #1   Title  Pt show AROM in L arm WNL to perfrom ADL's including blow dry her hair , earrings and dental hygiene    Baseline  see flowsheet- and trouble with ADL's act above     Time  4    Period  Weeks    Status  New    Target Date  04/06/18      OT LONG TERM GOAL #2   Title  upgrade to strength goal if indicated or needed     Baseline  no strengtening yet     Time  4    Period  Weeks    Status  New    Target Date  04/06/18            Plan - 03/17/18 1919    Clinical Impression Statement  Pt is 11 days out from acute , sligtly depressed Fx of radial head- xray show fracture still the same - pt  show fantastic AROM  - pt walk in with  flexion 145 , and extention -8 -and in session 0 degrees actively and with more ease - pt cont to do AROM and AAROM pain free and can decrease to 3 sessions a day     Occupational performance deficits (Please refer to evaluation for details):  ADL's;IADL's;Leisure;Play    Rehab Potential  Excellent    Current Impairments/barriers affecting progress:  pt high pain tolerance and can push herself - perfect motion     OT Frequency  --   1-2 x wk   OT Duration  4 weeks    OT Treatment/Interventions  Self-care/ADL training;Cryotherapy;Moist Heat;Passive range of motion;Patient/family education;Therapeutic exercise    Plan  assess progress in ROM and pain     Clinical Decision Making  Several treatment options, min-mod task modification necessary    OT Home Exercise Plan  see pt instruction     Consulted and Agree with Plan of Care  Patient       Patient will benefit from skilled therapeutic intervention in order to improve the following deficits and impairments:  Decreased knowledge of precautions, Pain, Impaired UE functional use, Impaired flexibility, Decreased range of motion  Visit Diagnosis: Stiffness of left elbow, not elsewhere classified  Pain in left elbow  Muscle weakness (generalized)  Carpal tunnel syndrome, right upper limb    Problem List Patient Active Problem List   Diagnosis Date Noted  . Vitamin D deficiency 01/17/2017  . Colon cancer screening   . Family history of colonic polyps 01/19/2016  . Chronic right SI joint pain 01/19/2016  . History of meniscal tear 02/02/2014  . 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 Encounter for preventive health examination 01/08/2013  . Menopause 05/14/2012  .  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 H/O thyroid cyst 05/14/2012    Oletta Cohn OTR/L,CLT 03/17/2018, 7:25 PM  [place] [facility] PHYSICAL AND SPORTS MEDICINE [address] [place], [address] Phone: [phone]   Fax:  [phone]  Name: Heather Watson Watson MRN: [id number] Date of Birth: 04-27-1955

## 2018-03-17 NOTE — Patient Instructions (Signed)
Same HEP - but can do AAROM of elbow flexion to clavicle on L  And look in mirror her progress with elbow extention to side  And then flexion progress to L shoulder

## 2018-03-21 ENCOUNTER — Ambulatory Visit: Payer: No Typology Code available for payment source | Attending: Orthopaedic Surgery | Admitting: Occupational Therapy

## 2018-03-21 DIAGNOSIS — M25622 Stiffness of left elbow, not elsewhere classified: Secondary | ICD-10-CM | POA: Insufficient documentation

## 2018-03-21 DIAGNOSIS — G5601 Carpal tunnel syndrome, right upper limb: Secondary | ICD-10-CM | POA: Diagnosis present

## 2018-03-21 DIAGNOSIS — M6281 Muscle weakness (generalized): Secondary | ICD-10-CM | POA: Insufficient documentation

## 2018-03-21 DIAGNOSIS — M25522 Pain in left elbow: Secondary | ICD-10-CM | POA: Diagnosis present

## 2018-03-21 NOTE — Therapy (Signed)
[place] [facility] PHYSICAL [facility] SPORTS MEDICINE [address], [place], [address] Phone: [phone]   Fax:  [phone]  Occupational Therapy Treatment  Patient Details  Name: Heather Watson MRN: [id number] Date of Birth: 10/20/1955 Referring Provider (OT): Mathis Bud   Encounter Date: 03/21/2018  OT End of Session - 03/21/18 1647    Visit Number  5    Number of Visits  6    Date for OT Re-Evaluation  04/20/18    OT Start Time  1600    OT Stop Time  1635    OT Time Calculation (min)  35 min    Activity Tolerance  Patient tolerated treatment well    Behavior During Therapy  [facility] for tasks assessed/performed       Past Medical History:  Diagnosis Date  . Gallstones   . UTI (urinary tract infection)     Past Surgical History:  Procedure Laterality Date  . COLONOSCOPY WITH PROPOFOL N/A 05/19/2016   Procedure: COLONOSCOPY WITH PROPOFOL;  Surgeon: Midge Minium, MD;  Location: [facility] ENDOSCOPY;  Service: Endoscopy;  Laterality: N/A;    There were no vitals filed for this visit.  Subjective Assessment - 03/21/18 1646    Subjective   I worked and did what you told me - 3 x day - I can touch my neck , under my ear and my shoulder- pain still good - only night time when changing position    Patient Stated Goals  I want my motion and strength back in my L elbow so I can workout and do what I did before - I do not want surgery     Currently in Pain?  No/denies         [facility] OT Assessment - 03/21/18 0001      AROM   Left Elbow Flexion  150    Left Elbow Extension  0   -3 to 0   Left Forearm Pronation  90 Degrees    Left Forearm Supination  90 Degrees        Pt arrive with AROM WNL for L elbow - touching back of neck , under L ear and L shoulder - no pain  And elbow extention -3 to 0 degrees - slight pull over bicep  Pt to cont with same HEP  done some graston tool nr 2 over bicep to decrease tightness in bicep and pull  And  then ice at end  [place] progress - pt to let me know - if doing well -do not have to be seen Wed                OT Education - 03/21/18 1647    Education Details  progress , ROM review HEP - but still not  to over do     Person(s) Educated  Patient    Methods  Explanation;Demonstration;Handout    Comprehension  Verbalized understanding;Returned demonstration       OT Short Term Goals - 03/21/18 1650      OT SHORT TERM GOAL #1   Title  Pt to be ind in HEP to increase L elbow flexion and extention  to WNL with pain less than 2/10    Baseline  sup /pro WNL - flexion 135 and extention -18 at eval -and now extention -3 to 0 and flexion WNL - no pain     Status  Achieved        OT Long Term Goals - 03/21/18  1650      OT LONG TERM GOAL #1   Title  Pt show AROM in L arm WNL to perfrom ADL's including blow dry her hair , earrings and dental hygiene    Baseline  see flowsheet-    Status  Achieved      OT LONG TERM GOAL #2   Title  upgrade to strength goal if indicated or needed     Baseline  no strengtening yet     Time  4    Period  Weeks    Status  On-going    Target Date  04/18/18            Plan - 03/21/18 1648    Clinical Impression Statement  Pt is now about 2 wks out from injury - pt come in with L elbow flexion WNL - and extention -3 to 0 degrees- slight pull on volar elbow - pt to cont with same HEP and precautions - and will let me know if she need to come in Wed or still the same.    Occupational performance deficits (Please refer to evaluation for details):  ADL's;IADL's;Leisure;Play    Rehab Potential  Excellent    Current Impairments/barriers affecting progress:  pt high pain tolerance and can push herself - perfect motion     OT Frequency  --   1-2 x wk   OT Duration  2 weeks    OT Treatment/Interventions  Self-care/ADL training;Cryotherapy;Moist Heat;Passive range of motion;Patient/family education;Therapeutic exercise    Plan  assess progress in  ROM and pain     Clinical Decision Making  Several treatment options, min-mod task modification necessary    OT Home Exercise Plan  see pt instruction     Consulted and Agree with Plan of Care  Patient       Patient will benefit from skilled therapeutic intervention in order to improve the following deficits and impairments:  Decreased knowledge of precautions, Pain, Impaired UE functional use, Impaired flexibility, Decreased range of motion  Visit Diagnosis: Stiffness of left elbow, not elsewhere classified  Pain in left elbow  Muscle weakness (generalized)    Problem List Patient Active Problem List   Diagnosis Date Noted  . Vitamin D deficiency 01/17/2017  . Colon cancer screening   . Family history of colonic polyps 01/19/2016  . Chronic right SI joint pain 01/19/2016  . History of meniscal tear 02/02/2014  . 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 Encounter for preventive health examination 01/08/2013  . Menopause 05/14/2012  . 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 H/O thyroid cyst 05/14/2012    Oletta Cohn OTR/L,CLT 03/21/2018, 4:52 PM  [place] [facility] PHYSICAL AND SPORTS MEDICINE [address] [place], [address] Phone: [phone]   Fax:  [phone]  Name: Heather Watson MRN: [id number] Date of Birth: 10-Feb-1956

## 2018-03-21 NOTE — Patient Instructions (Signed)
same

## 2018-03-23 ENCOUNTER — Ambulatory Visit: Payer: No Typology Code available for payment source | Admitting: Occupational Therapy

## 2018-03-28 ENCOUNTER — Ambulatory Visit: Payer: No Typology Code available for payment source | Admitting: Occupational Therapy

## 2018-03-28 DIAGNOSIS — M25622 Stiffness of left elbow, not elsewhere classified: Secondary | ICD-10-CM

## 2018-03-28 DIAGNOSIS — G5601 Carpal tunnel syndrome, right upper limb: Secondary | ICD-10-CM

## 2018-03-28 DIAGNOSIS — M6281 Muscle weakness (generalized): Secondary | ICD-10-CM

## 2018-03-28 DIAGNOSIS — M25522 Pain in left elbow: Secondary | ICD-10-CM

## 2018-03-28 NOTE — Therapy (Signed)
[place] [facility] PHYS[facility]ORTS MEDICINE [address], [place], [address] Phone: [phone]   Fax:  [phone]  Occupational Therapy Treatment  Patient Details  Name: Heather Watson MRN: [id number] Date of Birth: 1955/03/01 Referring Provider (OT): Mathis Bud   Encounter Date: 03/28/2018  OT End of Session - 03/28/18 0838    Visit Number  6    Number of Visits  6    Date for OT Re-Evaluation  04/20/18    OT Start Time  0730    OT Stop Time  0757    OT Time Calculation (min)  27 min    Activity Tolerance  Patient tolerated treatment well    Behavior During Therapy  [facility] for tasks assessed/performed       Past Medical History:  Diagnosis Date  . Gallstones   . UTI (urinary tract infection)     Past Surgical History:  Procedure Laterality Date  . COLONOSCOPY WITH PROPOFOL N/A 05/19/2016   Procedure: COLONOSCOPY WITH PROPOFOL;  Surgeon: Midge Minium, MD;  Location: [facility] ENDOSCOPY;  Service: Endoscopy;  Laterality: N/A;    There were no vitals filed for this visit.  Subjective Assessment - 03/28/18 0836    Subjective   Doing much better- no pain  - can go thru the motions now without issues -and pain at wrist with rotation - my R knee bothers me since carrying my bag on my R shoulder all the time     Patient Stated Goals  I want my motion and strength back in my L elbow so I can workout and do what I did before - I do not want surgery     Currently in Pain?  No/denies         [facility] OT Assessment - 03/28/18 0001      AROM   Left Elbow Flexion  150    Left Elbow Extension  0    Left Forearm Pronation  90 Degrees    Left Forearm Supination  90 Degrees         great progress in AROM last week in elbow flexion and extention - report no pain at night time  And during sup/pro no more pain at wrist  Pt able to functionally reach to back of neck , below ear , neck and behind back to bra         HEP decrease   to AROM after shower in the am  And then during day and evening  Check if motion still good Ice at the end of day if needed  And still no resistance  Can carry her bag on her L shoulder - just not her arm Barnie Alderman Ocie Bob because pt complain her knee hurts on the R more since carrying her gym back just on R shoulder        OT Education - 03/28/18 0838    Education Details  progress, changes to HEP     Person(s) Educated  Patient    Methods  Explanation;Demonstration;Handout    Comprehension  Verbalized understanding;Returned demonstration       OT Short Term Goals - 03/28/18 0840      OT SHORT TERM GOAL #1   Title  Pt to be ind in HEP to increase L elbow flexion and extention  to WNL with pain less than 2/10    Status  Achieved        OT Long Term Goals - 03/28/18 4158  OT LONG TERM GOAL #1   Title  Pt show AROM in L arm WNL to perfrom ADL's including blow dry her hair , earrings and dental hygiene    Status  Achieved      OT LONG TERM GOAL #2   Title  upgrade to strength goal if indicated or needed     Baseline  await new orders - pt only 3 wks now     Time  4    Period  Weeks    Status  On-going    Target Date  04/18/18            Plan - 03/28/18 5638    Clinical Impression Statement  Pt now 3 wks and 1 day out from fracture to L elbow - she made last week fantastic gains in AROM of elbow flexion and extention - to WNL - and this week no pain with sup and pronation at wrist -and at night time when changing positions - pt to see MD Thursday - await new orders     Occupational performance deficits (Please refer to evaluation for details):  ADL's;IADL's;Leisure;Play    Rehab Potential  Excellent    Current Impairments/barriers affecting progress:  pt high pain tolerance and can push herself - perfect motion     OT Frequency  --   1-2 x wk   OT Duration  2 weeks    OT Treatment/Interventions  Self-care/ADL training;Cryotherapy;Moist Heat;Passive range of  motion;Patient/family education;Therapeutic exercise    Plan  await new orders     Clinical Decision Making  Several treatment options, min-mod task modification necessary    OT Home Exercise Plan  see pt instruction     Consulted and Agree with Plan of Care  Patient       Patient will benefit from skilled therapeutic intervention in order to improve the following deficits and impairments:  Decreased knowledge of precautions, Pain, Impaired UE functional use, Impaired flexibility, Decreased range of motion  Visit Diagnosis: Stiffness of left elbow, not elsewhere classified  Pain in left elbow  Muscle weakness (generalized)  Carpal tunnel syndrome, right upper limb    Problem List Patient Active Problem List   Diagnosis Date Noted  . Vitamin D deficiency 01/17/2017  . Colon cancer screening   . Family history of colonic polyps 01/19/2016  . Chronic right SI joint pain 01/19/2016  . History of meniscal tear 02/02/2014  . 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 Encounter for preventive health examination 01/08/2013  . Menopause 05/14/2012  . 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 H/O thyroid cyst 05/14/2012    Oletta Cohn OTR/L,CLT  03/28/2018, 8:42 AM  [place] [facility] PHYSICAL AND SPORTS MEDICINE [address], [place], [address] Phone: [phone]   Fax:  [phone]  Name: Heather Watson MRN: [id number] Date of Birth: 09/30/1955

## 2018-03-28 NOTE — Patient Instructions (Signed)
Decrease to AROM after shower in the am  And then during day and evening  Check if motion still good Ice at the end of day if needed  And still no resistance  Can carry her bag on her L shoulder - just not her arm Barnie Alderman Ocie Bob

## 2018-04-14 ENCOUNTER — Other Ambulatory Visit: Payer: Self-pay | Admitting: Internal Medicine

## 2018-04-14 ENCOUNTER — Telehealth: Payer: Self-pay | Admitting: Internal Medicine

## 2018-04-14 DIAGNOSIS — S42402A Unspecified fracture of lower end of left humerus, initial encounter for closed fracture: Secondary | ICD-10-CM

## 2018-04-14 NOTE — Telephone Encounter (Signed)
Pt is needing a referral to [facility] Dr. Martha Clan in [place] for her insurance. Pt is being seen for left elbow.

## 2018-04-14 NOTE — Progress Notes (Signed)
Ortho referral placed for insurance

## 2018-04-14 NOTE — Telephone Encounter (Signed)
Copied from CRM [id number]. Topic: Referral - Request for Referral >> Apr 14, 2018 11:50 AM Maia Petties wrote: Has patient seen PCP for this complaint? yes *If NO, is insurance requiring patient see PCP for this issue before PCP can refer? Referral for which specialty: Ortho Preferred provider/office: [facility] Dr. Elenore Rota [place] Reason for referral: Right knee pain - pt has [employer] and needing referral for specific provider for knee pain - previous referral to [facility] was for her left elbow and a different provider.

## 2018-07-14 ENCOUNTER — Ambulatory Visit (INDEPENDENT_AMBULATORY_CARE_PROVIDER_SITE_OTHER): Payer: Self-pay | Admitting: Physician Assistant

## 2018-07-14 ENCOUNTER — Ambulatory Visit: Payer: Self-pay

## 2018-07-14 ENCOUNTER — Other Ambulatory Visit: Payer: Self-pay

## 2018-07-14 VITALS — BP 105/70 | HR 71 | Temp 97.8°F | Resp 16 | Wt 129.8 lb

## 2018-07-14 DIAGNOSIS — R300 Dysuria: Secondary | ICD-10-CM

## 2018-07-14 DIAGNOSIS — N3001 Acute cystitis with hematuria: Secondary | ICD-10-CM

## 2018-07-14 LAB — POCT URINALYSIS DIPSTICK
Bilirubin, UA: NEGATIVE
Glucose, UA: NEGATIVE
Ketones, UA: NEGATIVE
Nitrite, UA: NEGATIVE
Protein, UA: NEGATIVE
Spec Grav, UA: 1.01 (ref 1.010–1.025)
Urobilinogen, UA: 0.2 E.U./dL
pH, UA: 8.5 — AB (ref 5.0–8.0)

## 2018-07-14 MED ORDER — PHENAZOPYRIDINE HCL 200 MG PO TABS: 200.0000 mg | ORAL_TABLET | Freq: Three times a day (TID) | ORAL | 0 refills | Status: DC | PRN

## 2018-07-14 MED ORDER — NITROFURANTOIN MONOHYD MACRO 100 MG PO CAPS: 100.0000 mg | ORAL_CAPSULE | Freq: Two times a day (BID) | ORAL | 0 refills | Status: AC

## 2018-07-14 NOTE — Patient Instructions (Addendum)
Thank you for choosing [facility] for your health care needs.  You have been diagnosed with: 1. Dysuria - POCT urinalysis dipstick  2. Acute cystitis with hematuria - nitrofurantoin, macrocrystal-monohydrate, (MACROBID) 100 MG capsule; Take 1 capsule (100 mg total) by mouth 2 (two) times daily for 5 days.  Dispense: 10 capsule; Refill: 0 - phenazopyridine (PYRIDIUM) 200 MG tablet; Take 1 tablet (200 mg total) by mouth 3 (three) times daily as needed for pain.  Dispense: 10 tablet; Refill: 0  Please take medications as prescribed.  Increase fluids; water, Gatorade, or cranberry juice. May take over the counter Tylenol or ibuprofen for pain/discomfort. Empty bladder frequently.  Follow-up with family physician, urgent care, or Ob/Gyn in two days if symptoms not improving. Sooner with any worsening symptoms such as abdominal pain, fever/chills, nausea/vomiting, back pain, or other new/concerning symptom.  Follow-up with [place], family physician, or Ob/Gyn in 2-3 weeks for repeat urinalysis (to ensure you no longer have blood in the urine). If you do have blood in the urine, will advise you follow-up with family physician and/or urologist for further evaluation/treatment.  Have a great day!  Urinary Tract Infection, Adult A urinary tract infection (UTI) is an infection of any part of the urinary tract. The urinary tract includes:  The kidneys.  The ureters.  The bladder.  The urethra. These organs make, store, and get rid of pee (urine) in the body. What are the causes? This is caused by germs (bacteria) in your genital area. These germs grow and cause swelling (inflammation) of your urinary tract. What increases the risk? You are more likely to develop this condition if:  You have a small, thin tube (catheter) to drain pee.  You cannot control when you pee or poop (incontinence).  You are female, and: ? You use these methods to prevent pregnancy: ? A medicine that kills  sperm (spermicide). ? A device that blocks sperm (diaphragm). ? You have low levels of a female hormone (estrogen). ? You are pregnant.  You have genes that add to your risk.  You are sexually active.  You take antibiotic medicines.  You have trouble peeing because of: ? A prostate that is bigger than normal, if you are female. ? A blockage in the part of your body that drains pee from the bladder (urethra). ? A kidney stone. ? A nerve condition that affects your bladder (neurogenic bladder). ? Not getting enough to drink. ? Not peeing often enough.  You have other conditions, such as: ? Diabetes. ? A weak disease-fighting system (immune system). ? Sickle cell disease. ? Gout. ? Injury of the spine. What are the signs or symptoms? Symptoms of this condition include:  Needing to pee right away (urgently).  Peeing often.  Peeing small amounts often.  Pain or burning when peeing.  Blood in the pee.  Pee that smells bad or not like normal.  Trouble peeing.  Pee that is cloudy.  Fluid coming from the vagina, if you are female.  Pain in the belly or lower back. Other symptoms include:  Throwing up (vomiting).  No urge to eat.  Feeling mixed up (confused).  Being tired and grouchy (irritable).  A fever.  Watery poop (diarrhea). How is this treated? This condition may be treated with:  Antibiotic medicine.  Other medicines.  Drinking enough water. Follow these instructions at home:  Medicines  Take over-the-counter and prescription medicines only as told by your doctor.  If you were prescribed an antibiotic medicine, take it  as told by your doctor. Do not stop taking it even if you start to feel better. General instructions  Make sure you: ? Pee until your bladder is empty. ? Do not hold pee for a long time. ? Empty your bladder after sex. ? Wipe from front to back after pooping if you are a female. Use each tissue one time when you wipe.   Drink enough fluid to keep your pee pale yellow.  Keep all follow-up visits as told by your doctor. This is important. Contact a doctor if:  You do not get better after 1-2 days.  Your symptoms go away and then come back. Get help right away if:  You have very bad back pain.  You have very bad pain in your lower belly.  You have a fever.  You are sick to your stomach (nauseous).  You are throwing up. Summary  A urinary tract infection (UTI) is an infection of any part of the urinary tract.  This condition is caused by germs in your genital area.  There are many risk factors for a UTI. These include having a small, thin tube to drain pee and not being able to control when you pee or poop.  Treatment includes antibiotic medicines for germs.  Drink enough fluid to keep your pee pale yellow. This information is not intended to replace advice given to you by your health care provider. Make sure you discuss any questions you have with your health care provider. Document Released: 07/22/2007 Document Revised: 08/12/2017 Document Reviewed: 08/12/2017 Elsevier Interactive Patient Education  2019 [employer]

## 2018-07-14 NOTE — Progress Notes (Signed)
Patient ID: Heather BogaMaritza C Jicha DOB: Sep 12, 1955 AGE: 63 y.o. MRN: [id number]   PCP: Sherlene Shamsullo, Teresa L, MD   Chief Complaint:  Chief Complaint  Patient presents with  . Dysuria    3 HOURS      Subjective:    HPI:  Heather Watson is a 63 y.o. female presents for evaluation  Chief Complaint  Patient presents with  . Dysuria    3 HOURS     [age] year old female presents to [facility] with one day history of UTI symptoms. Began this morning when waking up. Reports dysuria (burning sensation, primarily at end of urinary stream) and increased urinary frequency (approximately every 30 minutes, sometime scant amount of urine). Associated increased urinary urgency. No incontinence. Has not taken any OTC medication for symptom relief. Denies fever, chills, sweats, body aches, nausea/vomiting, abdominal pain, back/flank pain, vaginal rash/discharge/pruritis, gross hematuria, urinary retention. Patient is not sexually active; no concern for STD.  Patient last treated for UTI at [facility] on 01/26/2018; prescribed Macrobid and Pyridium. Symptoms resolved.  Patient denies previous history of pyelonephritis or ureteral stone. Patient at age [age] had hematuria. 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 Patient has previously been prescribed a pessary; does not use. Patient used to have frequent UTIs; decreased in frequency when no longer being sexually active. 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  A limited review of symptoms was performed, pertinent positives and negatives as mentioned in HPI.  The following portions of the patient's history were reviewed and updated as appropriate: allergies, current medications and past  medical history.  Patient Active Problem List   Diagnosis Date Noted  . Vitamin D deficiency 01/17/2017  . Colon cancer screening   . Family history of colonic polyps 01/19/2016  . Chronic right SI joint pain 01/19/2016  . History of meniscal tear 02/02/2014  . 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 Encounter for preventive health examination 01/08/2013  . Menopause 05/14/2012  . 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 H/O thyroid cyst 05/14/2012    Allergies  Allergen Reactions  . Iodine Rash and Itching    Contrast through IV  . Soy Allergy     Current Outpatient Medications on File Prior to Visit  Medication Sig Dispense Refill  . calcium gluconate 500 MG tablet Take 1 tablet by mouth daily.    . Cholecalciferol (VITAMIN D) 2000 UNITS tablet Take 2,000 Units by mouth daily. Reported on 02/27/2015    . conjugated estrogens (PREMARIN) vaginal cream INSERT 1 GRAM VAGINALLY AT NIGHT TWICE A WEEK. 30 g 0  . Glucosamine-Chondroitin 500-400 MG CAPS Take 1 capsule by mouth daily.    . magnesium gluconate (MAGONATE) 500 MG tablet Take 800 mg by mouth daily.    . meloxicam (MOBIC) 7.5 MG tablet TAKE 1 TABLET (7.5 MG TOTAL) BY MOUTH DAILY AS NEEDED FOR JOINT PAIN 30 tablet 3  . Omega-3 1000 MG CAPS Take 1 capsule by mouth daily.    . benzonatate (TESSALON) 200 MG capsule Take 1 capsule (200 mg total) by mouth 2 (two) times daily as needed for cough. (Patient not taking: Reported on 07/14/2018) 20 capsule 0  . dextromethorphan 15 MG/5ML syrup Take 10 mLs (30 mg total) by mouth 4 (four) times daily as needed for  cough. (Patient not taking: Reported on 07/14/2018) 120 mL 0  . phenazopyridine (PYRIDIUM) 200 MG tablet Take 1 tablet (200 mg total) by mouth 3 (three) times daily as needed for pain. (Patient not taking: Reported on 07/14/2018) 15 tablet 0   No current facility-administered medications on file prior to visit.        Objective:   Vitals:   07/14/18 1222  BP: 105/70   Pulse: 71  Resp: 16  Temp: 97.8 F (36.6 C)  SpO2: 98%     Wt Readings from Last 3 Encounters:  07/14/18 129 lb 12.8 oz (58.9 kg)  02/10/18 129 lb (58.5 kg)  01/26/18 130 lb (59 kg)    Physical Exam:   General Appearance:  Patient sitting comfortably on examination table. Conversational. Peri Jefferson self-historian. In no acute distress. Afebrile.   Head:  Normocephalic, without obvious abnormality, atraumatic  Eyes:  PERRL, conjunctiva/corneas clear, EOM's intact  Lungs:   Clear to auscultation bilaterally, respirations unlabored. Good aeration. No rales, rhonchi, crackles or wheezing.  Heart:  Regular rate and rhythm, S1 and S2 normal, no murmur, rub, or gallop  Abdomen:   Normal to inspection. Normoactive bowel sounds. No tenderness with palpation. No guarding, rigidity or rebound tenderness. No palpable organomegaly. No CVA tenderness with percussion bilaterally.  Extremities: Extremities normal, atraumatic, no cyanosis or edema  Pulses: 2+ and symmetric  Skin: Skin color, texture, turgor normal, no rashes or lesions  Lymph nodes: Cervical, supraclavicular, and axillary nodes normal  Neurologic: Normal    Assessment & Plan:    Exam findings, diagnosis etiology and medication use and indications reviewed with patient. Follow-Up and discharge instructions provided. No emergent/urgent issues found on exam.  Patient education was provided.   Patient verbalized understanding of information provided and agrees with plan of care (POC), all questions answered. The patient is advised to call or return to clinic if condition does not see an improvement in symptoms, or to seek the care of the closest emergency department if condition worsens with the below plan.   Orders Placed This Encounter  Procedures  . POCT urinalysis dipstick    UA reveals large leukocytes, negative nitrites, 0.2 uro, negatie protein, pH of 8.5, large blood, SG of 1.010, negative ketones, negative bilirubin, and  negative glucose.   1. Dysuria - POCT urinalysis dipstick  2. Acute cystitis with hematuria - nitrofurantoin, macrocrystal-monohydrate, (MACROBID) 100 MG capsule; Take 1 capsule (100 mg total) by mouth 2 (two) times daily for 5 days.  Dispense: 10 capsule; Refill: 0 - phenazopyridine (PYRIDIUM) 200 MG tablet; Take 1 tablet (200 mg total) by mouth 3 (three) times daily as needed for pain.  Dispense: 10 tablet; Refill: 0   Patient with several hour history of dysuria and increased urinary frequency. UA reveals large leukocytes and large blood. Previous UAs reveal hematuria (one three years ago, one two years ago, and one 5 months ago at [facility]). Patient is a former cigarette smoker; smoked for 7 years in her early 23s. VSS, afebrile, in no acute distress, benign abdominal exam, no CVA tenderness (which would be concerning for pyelonephritis). At this time, will treat patient for uncomplicated cystitis with hematuria. Prescribed 5-day course of Macrobid  bid and prescribed Pyridium for symptom relief. Advised further evaluation in 2 days by PCP, urgent care, or Ob/Gyn if symptoms not improving (at that time, will need a urine culture) - sooner re-evaluation with any worsening symptoms such as fever, chills, abdominal pain, flank/back pain, etc. Advised patient seek repeat  UA in 2-3 weeks, when not having UTI symptoms, to evaluate for hematuria. If continued hematuria, should follow-up with PCP and/or urologist. Patient agreed with plan.   [name], MHS, PA-C Rulon Sera, MHS, PA-C Advanced Practice Provider [facility]  [phone] [address] [place], [place] [address] (p): [phone] [email or web address]

## 2018-07-18 ENCOUNTER — Telehealth: Payer: Self-pay

## 2018-07-18 NOTE — Telephone Encounter (Signed)
Patient did not answered the phone, I left a message asking to call us back.  

## 2018-07-18 NOTE — Telephone Encounter (Signed)
Phone call

## 2018-08-17 ENCOUNTER — Ambulatory Visit: Payer: No Typology Code available for payment source | Attending: Orthopedic Surgery

## 2018-08-17 ENCOUNTER — Other Ambulatory Visit: Payer: Self-pay

## 2018-08-17 DIAGNOSIS — M25661 Stiffness of right knee, not elsewhere classified: Secondary | ICD-10-CM | POA: Diagnosis not present

## 2018-08-17 DIAGNOSIS — M25561 Pain in right knee: Secondary | ICD-10-CM | POA: Insufficient documentation

## 2018-08-18 NOTE — Therapy (Signed)
[facility] [address] [place], [place], [address] Phone: [phone]   Fax:  [phone]  Physical Therapy Evaluation  Patient Details  Name: Heather BogaMaritza C Watson MRN: [id number] Date of Birth: [birth year] Referring Provider (PT): Martha ClanKrasinski MD   Encounter Date: 08/17/2018  PT End of Session - 08/17/18 1606    Visit Number  1    Number of Visits  13    Date for PT Re-Evaluation  09/28/18    PT Start Time  1030    PT Stop Time  1130    PT Time Calculation (min)  60 min    Activity Tolerance  Patient tolerated treatment well;Patient limited by pain    Behavior During Therapy  [facility] for tasks assessed/performed       Past Medical History:  Diagnosis Date  . Gallstones   . UTI (urinary tract infection)     Past Surgical History:  Procedure Laterality Date  . COLONOSCOPY WITH PROPOFOL N/A 05/19/2016   Procedure: COLONOSCOPY WITH PROPOFOL;  Surgeon: Midge Miniumarren Wohl, MD;  Location: [facility] ENDOSCOPY;  Service: Endoscopy;  Laterality: N/A;    There were no vitals filed for this visit.   Subjective Assessment - 08/17/18 1249    Subjective  Patient reports increased R sided knee pain from a fall in January of this year. Patient states she has pain in the morning and inceased pain with performing transfers from sitting to standing after sitting for long periods of time. Patient reports increased R knee swelling since the fall and states the swelling is worst after performing consistent motions. Patient demonstrate theres some improvement since the fall but continues to have difficulty after performing prolonged exercise and activity.Patient states difficulty with stadning at work at day. Patient states she has been performing exercises to help her knee pain.    Pertinent History  History of minor -meniscus tear 15 years prior    Limitations  Standing;Walking    How long can you stand comfortably?  5 hours    How long can you walk  comfortably?  no pain with walking; pain on the next day    Patient Stated Goals  To improve knee pain    Currently in Pain?  No/denies    Pain Score  0-No pain   5/10 - worst pain along the knee   Multiple Pain Sites  Yes         [facility] - 08/18/18 0808      Assessment   Medical Diagnosis  R knee Pain    Referring Provider (PT)  Martha ClanKrasinski MD    Onset Date/Surgical Date  03/06/18    Hand Dominance  Right    Next MD Visit  unknown    Prior Therapy  yes - hand, pelvic floor      Balance Screen   Has the patient fallen in the past 6 months  No    Has the patient had a decrease in activity level because of a fear of falling?   No    Is the patient reluctant to leave their home because of a fear of falling?   No      Home Environment   Living Environment  Private residence    Living Arrangements  Spouse/significant other    Type of Home  House      Prior Function   Level of Independence  Independent    Vocation  Full time employment    Vocation Requirements  Standing, walking     Leisure  spanish interpreter, work out in gym, hike ,       Cognition   Overall Cognitive Status  Within Functional Limits for tasks assessed      Sensation   Light Touch  Appears Intact      Functional Tests   Functional tests  Squat;Step up;Step down;Single Leg Squat      Squat   Comments  Increased forward knee translation with performing squatting motions      Step Up   Comments  WNL      Step Down   Comments  Increased forward knee translation increaed knee valgum       Single Leg Squat   Comments  Increased forward knee translation, increased knee valgum       Posture/Postural Control   Posture Comments  WNL      ROM / Strength   AROM / PROM / Strength  AROM;Strength      AROM   AROM Assessment Site  Hip;Knee    Right/Left Hip  Right;Left    Right Hip Extension  10    Right Hip Flexion  120    Right Hip External Rotation   40    Right Hip Internal Rotation    30    Right Hip ABduction  40    Right Hip ADduction  30    Left Hip Extension  10    Left Hip Flexion  120    Left Hip External Rotation   40    Left Hip Internal Rotation   30    Left Hip ABduction  40    Left Hip ADduction  30    Right/Left Knee  Left;Right    Right Knee Extension  -5    Right Knee Flexion  135    Left Knee Extension  0    Left Knee Flexion  135      Strength   Strength Assessment Site  Hip;Knee    Right/Left Hip  Right;Left    Right Hip Flexion  4+/5    Right Hip Extension  4/5    Right Hip External Rotation   4-/5    Right Hip Internal Rotation  4+/5    Right Hip ABduction  4/5    Right Hip ADduction  4+/5    Left Hip Flexion  5/5    Left Hip Extension  4+/5    Left Hip External Rotation  4+/5    Left Hip Internal Rotation  5/5    Left Hip ABduction  4+/5    Left Hip ADduction  5/5    Right/Left Knee  Right;Left    Right Knee Flexion  4+/5    Right Knee Extension  4+/5    Left Knee Flexion  5/5    Left Knee Extension  4+/5      Palpation   Palpation comment  TTP with performing palpation along the superior aspect of the patella      Special Tests    Special Tests  Knee Special Tests    Knee Special tests   Step-up/Step Down Test      Step-up/Step Down    Findings  Positive    Side   Right    Comments  Increased knee valgus       R knee swelling on the R side   Objective measurements completed on examination: See above findings.   TREATMENT Squats in standing with GTB around knees -- x  20 Single leg squats in standing -- x 20  LAQ in sitting with BlackTB -- x 20 SLR with performing quad set -- x 20 Sidelying hip abduction with RTB -- x 20    Patient demonstrates improvement with deep knee bend after activation lateral hip musculature.          PT Education - 08/17/18 1600    Education provided  Yes    Education Details  form/technique with exercise: POC; HEP: LAQ, hip hiking, step down, heel taps    Person(s) Educated   Patient    Methods  Explanation;Demonstration    Comprehension  Verbalized understanding;Returned demonstration          PT Long Term Goals - 08/17/18 1618      PT LONG TERM GOAL #1   Title  Patient will be independent with HEP to continue benefits of therapy after discharge.    Baseline  dependent    Time  6    Period  Weeks    Status  New    Target Date  09/28/18      PT LONG TERM GOAL #2   Title  Patient will have a worst pain score of a 0/10 to indicate significant improvement in pain and spasms and allow for greater amount of recreational activities.    Baseline  5/10 worst pain    Time  12    Period  Weeks    Status  New    Target Date  09/28/18      PT LONG TERM GOAL #3   Title  Patient will be able to stand throughout her work day without an increase in pain to improve occupational tasks.    Baseline  Increased pain with standing at the end of the day    Time  6    Period  Weeks    Status  New    Target Date  09/28/18      PT LONG TERM GOAL #4   Title  --    Time  --    Period  --    Status  --      PT LONG TERM GOAL #5   Title  --    Time  --    Period  --    Status  --             Plan - 08/17/18 1607    Clinical Impression Statement  Patient is a 63 yo right hand dominant female presenting with increased R knee pain s/p a fall in January 2020. Patient demonstrates increased inflammation along the superior aspect of her R patella, pain onset with bending her knee in weight bearing positions most notably with stepping and squatting motions. Patient demonstrates a decrease in pain with activation of her lateral hip musculature with step downs and deep knee bending. Patient demonstrates decreased hip strength, and quad strength on the R side compared to the L limiting motor control and strength capabilities. Given HEP to be performed at home. Patient will benefit from further skilled therapy focus on improving strength to improve pain and spasms on the  affected side.    Personal Factors and Comorbidities  Age    Examination-Activity Limitations  Squat;Stairs;Stand    Stability/Clinical Decision Making  Stable/Uncomplicated    Clinical Decision Making  Low    Rehab Potential  Good    PT Frequency  Biweekly    PT Duration  6 weeks    PT Treatment/Interventions  ADLs/Self  Care Home Management;Moist Heat;Therapeutic activities;Therapeutic exercise;Traction;Aquatic Therapy;Biofeedback;Electrical Stimulation;Gait training;Functional mobility training;Stair training;Balance training;Neuromuscular re-education;Patient/family education;Scar mobilization;Manual techniques;Energy conservation;Dry needling;Cryotherapy    PT Home Exercise Plan  See education section    Consulted and Agree with Plan of Care  Patient       Patient will benefit from skilled therapeutic intervention in order to improve the following deficits and impairments:  Decreased balance, Postural dysfunction, Impaired sensation, Decreased mobility, Pain, Increased muscle spasms, Increased fascial restricitons, Decreased safety awareness, Decreased endurance, Decreased scar mobility, Decreased range of motion, Impaired flexibility, Improper body mechanics, Decreased coordination, Decreased strength, Impaired vision/preception, Difficulty walking  Visit Diagnosis: 1. Stiffness of right knee, not elsewhere classified   2. Right knee pain, unspecified chronicity        Problem List Patient Active Problem List   Diagnosis Date Noted  . Vitamin D deficiency 01/17/2017  . Colon cancer screening   . Family history of colonic polyps 01/19/2016  . Chronic right SI joint pain 01/19/2016  . History of meniscal tear 02/02/2014  . 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 Encounter for preventive health examination 01/08/2013  . Menopause 05/14/2012  . 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 H/O thyroid cyst 05/14/2012    Blythe Stanford, PT DPT 08/18/2018, 10:13 AM  [place] [facility] [address] [place], [address] Phone: [phone]   Fax:  [phone]  Name: Heather Watson MRN: [id number] Date of Birth: 1955-05-31

## 2018-08-24 ENCOUNTER — Ambulatory Visit: Payer: No Typology Code available for payment source

## 2018-08-29 ENCOUNTER — Ambulatory Visit: Payer: No Typology Code available for payment source

## 2018-08-29 ENCOUNTER — Other Ambulatory Visit: Payer: Self-pay

## 2018-08-29 DIAGNOSIS — M25561 Pain in right knee: Secondary | ICD-10-CM

## 2018-08-29 DIAGNOSIS — M25661 Stiffness of right knee, not elsewhere classified: Secondary | ICD-10-CM

## 2018-08-30 NOTE — Therapy (Signed)
[facility] [address], [place], [address] Phone: [phone]   Fax:  [phone]  Physical Therapy Treatment  Patient Details  Name: Heather Watson MRN: [id number] Date of Birth: March 20, 1955 Referring Provider (PT): Mack Guise MD   Encounter Date: 08/29/2018  PT End of Session - 08/30/18 1052    Visit Number  2    Number of Visits  13    Date for PT Re-Evaluation  09/28/18    PT Start Time  8416    PT Stop Time  1830    PT Time Calculation (min)  45 min    Activity Tolerance  Patient tolerated treatment well;Patient limited by pain    Behavior During Therapy  [facility] for tasks assessed/performed       Past Medical History:  Diagnosis Date  . Gallstones   . UTI (urinary tract infection)     Past Surgical History:  Procedure Laterality Date  . COLONOSCOPY WITH PROPOFOL N/A 05/19/2016   Procedure: COLONOSCOPY WITH PROPOFOL;  Surgeon: Lucilla Lame, MD;  Location: [facility] ENDOSCOPY;  Service: Endoscopy;  Laterality: N/A;    There were no vitals filed for this visit.  Subjective Assessment - 08/30/18 1049    Subjective  Patient reports increase in pain with exercises, most notably with performing knee bending from a step. Patient reports her pain has been increasing overall.    Pertinent History  History of minor -meniscus tear 15 years prior    Limitations  Standing;Walking    How long can you stand comfortably?  5 hours    How long can you walk comfortably?  no pain with walking; pain on the next day    Patient Stated Goals  To improve knee pain    Currently in Pain?  No/denies       TREATMENT Therapeutic Exercise: Step downs -- 6" x 20 Step downs -- 12" x 20 SLR in long sitting with performing  Sitting ER -- x 20 with GTB  Manual therapy: STM performed to the hip adductors, pes anserine, medial quads, and hamstring on the affected side to decrease pain and spasms on the affected to decrease  pain and spasms.   Knee PA mobs tib on femoral to improve knee ext in sitting -- 3 x 30sec   Patient demonstrates improvement in pain at the end of the sesison. Patient requires verbal and tactile cueing for proper hip positioning during exercise     PT Education - 08/30/18 1052    Education provided  Yes    Education Details  Added [employer] set exercise    Person(s) Educated  Patient    Methods  Explanation;Demonstration    Comprehension  Verbalized understanding;Returned demonstration          PT Long Term Goals - 08/17/18 1618      PT LONG TERM GOAL #1   Title  Patient will be independent with HEP to continue benefits of therapy after discharge.    Baseline  dependent    Time  6    Period  Weeks    Status  New    Target Date  09/28/18      PT LONG TERM GOAL #2   Title  Patient will have a worst pain score of a 0/10 to indicate significant improvement in pain and spasms and allow for greater amount of recreational activities.    Baseline  5/10 worst pain    Time  12  Period  Weeks    Status  New    Target Date  09/28/18      PT LONG TERM GOAL #3   Title  Patient will be able to stand throughout her work day without an increase in pain to improve occupational tasks.    Baseline  Increased pain with standing at the end of the day    Time  6    Period  Weeks    Status  New    Target Date  09/28/18      PT LONG TERM GOAL #4   Title  --    Time  --    Period  --    Status  --      PT LONG TERM GOAL #5   Title  --    Time  --    Period  --    Status  --            Plan - 08/30/18 1057    Clinical Impression Statement  Patient reports increased pain along the gracilis on the affected side near the insertion into the pes anserine. Performed dry needling and manual therapy to the area which helped abolish her pain. Patient demosntrates improvement in pain and performing step down exercise pain after performing manual therapy. Patient wil benefit from further  skilled therapy to return to prior level of function.    Personal Factors and Comorbidities  Age    Examination-Activity Limitations  Squat;Stairs;Stand    Stability/Clinical Decision Making  Stable/Uncomplicated    Rehab Potential  Good    PT Frequency  Biweekly    PT Duration  6 weeks    PT Treatment/Interventions  ADLs/Self Care Home Management;Moist Heat;Therapeutic activities;Therapeutic exercise;Traction;Aquatic Therapy;Biofeedback;Electrical Stimulation;Gait training;Functional mobility training;Stair training;Balance training;Neuromuscular re-education;Patient/family education;Scar mobilization;Manual techniques;Energy conservation;Dry needling;Cryotherapy    PT Home Exercise Plan  See education section    Consulted and Agree with Plan of Care  Patient       Patient will benefit from skilled therapeutic intervention in order to improve the following deficits and impairments:  Decreased balance, Postural dysfunction, Impaired sensation, Decreased mobility, Pain, Increased muscle spasms, Increased fascial restricitons, Decreased safety awareness, Decreased endurance, Decreased scar mobility, Decreased range of motion, Impaired flexibility, Improper body mechanics, Decreased coordination, Decreased strength, Impaired vision/preception, Difficulty walking  Visit Diagnosis: 1. Stiffness of right knee, not elsewhere classified   2. Right knee pain, unspecified chronicity        Problem List Patient Active Problem List   Diagnosis Date Noted  . Vitamin D deficiency 01/17/2017  . Colon cancer screening   . Family history of colonic polyps 01/19/2016  . Chronic right SI joint pain 01/19/2016  . History of meniscal tear 02/02/2014  . 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 Encounter for preventive health examination 01/08/2013  . Menopause 05/14/2012  . 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 H/O thyroid cyst 05/14/2012    Myrene GalasWesley Virl Coble, PT DPT 08/30/2018, 11:00  AM  [facility] [address] [place], [place], [address] Phone: [phone]   Fax:  [phone]  Name: Jeannine BogaMaritza C Holdman MRN: [id number] Date of Birth: 12-12-1955

## 2018-09-14 ENCOUNTER — Other Ambulatory Visit: Payer: Self-pay

## 2018-09-14 ENCOUNTER — Ambulatory Visit: Payer: No Typology Code available for payment source

## 2018-09-14 DIAGNOSIS — M25561 Pain in right knee: Secondary | ICD-10-CM

## 2018-09-14 DIAGNOSIS — M25661 Stiffness of right knee, not elsewhere classified: Secondary | ICD-10-CM

## 2018-09-15 NOTE — Therapy (Signed)
[facility] [address] [place], [place], [address] Phone: [phone]   Fax:  [phone]  Physical Therapy Treatment  Patient Details  Name: Heather BogaMaritza C Hakimi MRN: [id number] Date of Birth: [birth year] Referring Provider (PT): Martha ClanKrasinski MD   Encounter Date: 09/14/2018  PT End of Session - 09/15/18 1055    Visit Number  3    Number of Visits  13    Date for PT Re-Evaluation  09/28/18    PT Start Time  1730    PT Stop Time  1815    PT Time Calculation (min)  45 min    Activity Tolerance  Patient tolerated treatment well;Patient limited by pain    Behavior During Therapy  [facility] for tasks assessed/performed       Past Medical History:  Diagnosis Date  . Gallstones   . UTI (urinary tract infection)     Past Surgical History:  Procedure Laterality Date  . COLONOSCOPY WITH PROPOFOL N/A 05/19/2016   Procedure: COLONOSCOPY WITH PROPOFOL;  Surgeon: Midge Miniumarren Wohl, MD;  Location: [facility] ENDOSCOPY;  Service: Endoscopy;  Laterality: N/A;    There were no vitals filed for this visit.  Subjective Assessment - 09/14/18 1729    Subjective  Pt reports an increase in pain with exercises, most notably with step downs. Pt reported increased difficulty with gait and has a noticeable gait deviation (R>L). Pt has switched to a more rigid R knee orthotic for increased support. Pt reported unchanged sx following last session.    Pertinent History  History of minor -meniscus tear 15 years prior    Limitations  Standing;Walking    How long can you stand comfortably?  5 hours    How long can you walk comfortably?  no pain with walking; pain on the next day    Patient Stated Goals  To improve knee pain    Currently in Pain?  Yes               TREATMENT Therapeutic Exercise Seated R SLR with tactile cueing on rectus femoris Seated R SLR 2x10 Long sitting R SLR - 3 x 10 Quad set in long sitting - 2 x 10  Standing TKE with RTB  3x10 with 3 sec  Standing SL RDL with RTB row at counter 2x10 each leg Manual therapy PA tibial on femoral joint mobilizations with patient's positioned in sitting into knee extension - 10 x 45 sec grade IV to improve R knee extension in long sitting  Performed exercises in sitting and standing focused on improving R terminal knee extension to improve pain and function. Patient demonstrates less pain at the end of the session compared to previous sessions.   PT Education - 09/15/18 1054    Education provided  Yes    Education Details  Quad set, SLR quad set, TKE added to [employer]    Person(s) Educated  Patient    Methods  Explanation;Demonstration    Comprehension  Verbalized understanding;Returned demonstration          PT Long Term Goals - 08/17/18 1618      PT LONG TERM GOAL #1   Title  Patient will be independent with HEP to continue benefits of therapy after discharge.    Baseline  dependent    Time  6    Period  Weeks    Status  New    Target Date  09/28/18      PT LONG TERM GOAL #2  Title  Patient will have a worst pain score of a 0/10 to indicate significant improvement in pain and spasms and allow for greater amount of recreational activities.    Baseline  5/10 worst pain    Time  12    Period  Weeks    Status  New    Target Date  09/28/18      PT LONG TERM GOAL #3   Title  Patient will be able to stand throughout her work day without an increase in pain to improve occupational tasks.    Baseline  Increased pain with standing at the end of the day    Time  6    Period  Weeks    Status  New    Target Date  09/28/18      PT LONG TERM GOAL #4   Title  --    Time  --    Period  --    Status  --      PT LONG TERM GOAL #5   Title  --    Time  --    Period  --    Status  --            Plan - 09/15/18 1055    Clinical Impression Statement  Patient demonstrates significant improvement after focusing on improving terminal knee extension. Patient  demonstrates improvement in knee extension at the end of the session compared to the beginning of the session indicating decrease in knee pressure.Although patient is improving, she continues to have increased pain with performing step downs.  Patient will benefit from further skilled therapy to return to prior level of function.    Personal Factors and Comorbidities  Age    Examination-Activity Limitations  Squat;Stairs;Stand    Stability/Clinical Decision Making  Stable/Uncomplicated    Rehab Potential  Good    PT Frequency  Biweekly    PT Duration  6 weeks    PT Treatment/Interventions  ADLs/Self Care Home Management;Moist Heat;Therapeutic activities;Therapeutic exercise;Traction;Aquatic Therapy;Biofeedback;Electrical Stimulation;Gait training;Functional mobility training;Stair training;Balance training;Neuromuscular re-education;Patient/family education;Scar mobilization;Manual techniques;Energy conservation;Dry needling;Cryotherapy    PT Home Exercise Plan  See education section    Consulted and Agree with Plan of Care  Patient       Patient will benefit from skilled therapeutic intervention in order to improve the following deficits and impairments:  Decreased balance, Postural dysfunction, Impaired sensation, Decreased mobility, Pain, Increased muscle spasms, Increased fascial restricitons, Decreased safety awareness, Decreased endurance, Decreased scar mobility, Decreased range of motion, Impaired flexibility, Improper body mechanics, Decreased coordination, Decreased strength, Impaired vision/preception, Difficulty walking  Visit Diagnosis: 1. Stiffness of right knee, not elsewhere classified   2. Right knee pain, unspecified chronicity        Problem List Patient Active Problem List   Diagnosis Date Noted  . Vitamin D deficiency 01/17/2017  . Colon cancer screening   . Family history of colonic polyps 01/19/2016  . Chronic right SI joint pain 01/19/2016  . History of meniscal  tear 02/02/2014  . 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 Encounter for preventive health examination 01/08/2013  . Menopause 05/14/2012  . 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 H/O thyroid cyst 05/14/2012    Blythe Stanford, PT DPT 09/15/2018, 11:00 AM  [place] [facility] [address], [place], [address] Phone: [phone]   Fax:  [phone]  Name: BARBETTE MCGLAUN MRN: [id number] Date of Birth: 10-30-1955

## 2018-10-11 ENCOUNTER — Ambulatory Visit: Payer: No Typology Code available for payment source

## 2018-10-19 ENCOUNTER — Ambulatory Visit: Payer: No Typology Code available for payment source | Attending: Orthopedic Surgery

## 2018-10-19 ENCOUNTER — Other Ambulatory Visit: Payer: Self-pay

## 2018-10-19 DIAGNOSIS — M25661 Stiffness of right knee, not elsewhere classified: Secondary | ICD-10-CM | POA: Insufficient documentation

## 2018-10-19 DIAGNOSIS — M25561 Pain in right knee: Secondary | ICD-10-CM | POA: Diagnosis present

## 2018-10-20 NOTE — Therapy (Signed)
[facility] [address], [place], [address] Phone: [phone]   Fax:  [phone]  Physical Therapy Treatment  Patient Details  Name: Heather Watson MRN: [id number] Date of Birth: 02/02/1956 Referring Provider (PT): Martha Clan MD   Encounter Date: 10/19/2018  PT End of Session - 10/20/18 1704    Visit Number  4    Number of Visits  13    Date for PT Re-Evaluation  09/28/18    PT Start Time  1845    PT Stop Time  1930    PT Time Calculation (min)  45 min    Activity Tolerance  Patient tolerated treatment well;Patient limited by pain    Behavior During Therapy  [facility] for tasks assessed/performed       Past Medical History:  Diagnosis Date  . Gallstones   . UTI (urinary tract infection)     Past Surgical History:  Procedure Laterality Date  . COLONOSCOPY WITH PROPOFOL N/A 05/19/2016   Procedure: COLONOSCOPY WITH PROPOFOL;  Surgeon: Midge Minium, MD;  Location: [facility] ENDOSCOPY;  Service: Endoscopy;  Laterality: N/A;    There were no vitals filed for this visit.  Subjective Assessment - 10/20/18 1616    Subjective  Patient reports that she has been feeling increased pain and difficulty with walking and ascending/descending stairs. Patient reports increased difficulty with straightening her leg and reports she is unable to perform prolonged sitting without a significant increase in pain when stadning up. Patient states she feels like her knee is becoming worse.    Pertinent History  History of minor -meniscus tear 15 years prior    Limitations  Standing;Walking    How long can you stand comfortably?  5 hours    How long can you walk comfortably?  no pain with walking; pain on the next day    Patient Stated Goals  To improve knee pain    Currently in Pain?  Yes    Pain Score  3     Pain Location  Knee    Pain Orientation  Right    Pain Descriptors / Indicators  Aching    Pain Type  Acute pain    Pain  Onset  More than a month ago    Pain Frequency  Intermittent       TREATMENT Therapeutic Exercise SLR with foot positioned on stool - x 4 x 10  Hip hikes off of 3" step -- x 20  Knee extension in sititng -- 4 x 10 with therapist OP Quad sets in long sitting -- 4 x 10 with therapist OP prone knee hangs -- 2 x 10 with 10# weight Performed exercises to improve knee AROM and strengthen the quadriceps. Manual therapy STM performed to quadriceps muscular with patient positioned in long sitting to decrease increased spasms and improve pain response to tactile pressure Joint mobilizations: Tibial-femoral joint mobilizations with patient positioned in long sitting to decrease increased pain and spasms and improve knee extension -- 20 x 30sec grade III/IV to tolerance   PT Education - 10/20/18 1703    Education provided  Yes    Education Details  HEP Reinforced Quad set, knee extension with OP over knee, Advanced TKE with Blue TB    Person(s) Educated  Patient    Methods  Explanation;Demonstration    Comprehension  Verbalized understanding;Returned demonstration          PT Long Term Goals - 10/20/18 1708  PT LONG TERM GOAL #1   Title  Patient will be independent with HEP to continue benefits of therapy after discharge.    Baseline  dependent; 10/20/2018: Independent with HEP performance; requires moderate cueing for progression    Time  6    Period  Weeks    Status  On-going      PT LONG TERM GOAL #2   Title  Patient will have a worst pain score of a 0/10 to indicate significant improvement in pain and spasms and allow for greater amount of recreational activities.    Baseline  5/10 worst pain; 10/20/2018: 7/10 worst pain    Time  12    Period  Weeks    Status  On-going      PT LONG TERM GOAL #3   Title  Patient will be able to stand throughout her work day without an increase in pain to improve occupational tasks.    Baseline  Increased pain with standing at the end of the  day; 10/20/2018 increase in pain with standing for longer periods of time    Time  6    Period  Weeks    Status  On-going            Plan - 10/20/18 1704    Clinical Impression Statement  Patient demonstrates an improvement in symptoms after performing knee extension based exercises, focused today's session on improving knee extension, most improvements noted with Tibial-femoral mobilizations in long sitting. Patient demonstrates a gradual worsening of symptoms, over the course of therapy and has lost end range knee extension which may further indicate possible meniscus invovement. Educated patient to contact Orthopedic Physician for further possible options. Patient will continue to benefit from further skilled therapy focused on improving knee AROM limitations to return to prior level of function.    Personal Factors and Comorbidities  Age    Examination-Activity Limitations  Squat;Stairs;Stand    Stability/Clinical Decision Making  Stable/Uncomplicated    Rehab Potential  Good    PT Frequency  Biweekly    PT Duration  6 weeks    PT Treatment/Interventions  ADLs/Self Care Home Management;Moist Heat;Therapeutic activities;Therapeutic exercise;Traction;Aquatic Therapy;Biofeedback;Electrical Stimulation;Gait training;Functional mobility training;Stair training;Balance training;Neuromuscular re-education;Patient/family education;Scar mobilization;Manual techniques;Energy conservation;Dry needling;Cryotherapy    PT Home Exercise Plan  See education section    Consulted and Agree with Plan of Care  Patient       Patient will benefit from skilled therapeutic intervention in order to improve the following deficits and impairments:  Decreased balance, Postural dysfunction, Impaired sensation, Decreased mobility, Pain, Increased muscle spasms, Increased fascial restricitons, Decreased safety awareness, Decreased endurance, Decreased scar mobility, Decreased range of motion, Impaired flexibility,  Improper body mechanics, Decreased coordination, Decreased strength, Impaired vision/preception, Difficulty walking  Visit Diagnosis: Stiffness of right knee, not elsewhere classified  Right knee pain, unspecified chronicity     Problem List Patient Active Problem List   Diagnosis Date Noted  . Vitamin D deficiency 01/17/2017  . Colon cancer screening   . Family history of colonic polyps 01/19/2016  . Chronic right SI joint pain 01/19/2016  . History of meniscal tear 02/02/2014  . 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 Encounter for preventive health examination 01/08/2013  . Menopause 05/14/2012  . 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 H/O thyroid cyst 05/14/2012    Myrene GalasWesley Ellakate Gonsalves, PT DPT 10/20/2018, 5:10 PM  [facility] [address] [place], [place], [address] Phone: [phone]  Fax:  [phone]  Name: Heather Watson MRN: [id number] Date of Birth: December 12, 1955

## 2018-10-31 ENCOUNTER — Ambulatory Visit: Payer: No Typology Code available for payment source

## 2019-01-02 ENCOUNTER — Other Ambulatory Visit: Payer: Self-pay | Admitting: Internal Medicine

## 2019-01-02 DIAGNOSIS — Z1231 Encounter for screening mammogram for malignant neoplasm of breast: Secondary | ICD-10-CM

## 2019-01-18 ENCOUNTER — Other Ambulatory Visit: Payer: Self-pay

## 2019-01-19 ENCOUNTER — Other Ambulatory Visit: Payer: Self-pay

## 2019-01-19 ENCOUNTER — Telehealth: Payer: Self-pay

## 2019-01-19 NOTE — Telephone Encounter (Signed)
If she prefers a face to face that's fine but it cannot be in the morning unless it is a Thursday

## 2019-01-19 NOTE — Telephone Encounter (Signed)
I spoke with patient & she is now scheduled for in person visit 12/10 a Thursday morning at 9:30.

## 2019-01-19 NOTE — Telephone Encounter (Signed)
Copied from [place] [id number]. Topic: General - Other >> Jan 19, 2019  8:36 AM Leward Quan A wrote: Reason for CRM: Patient called to request a call back in reference to her appointment. She is not pleased that she can not be seen in the office. Patient would like a call back to discuss this issue. Please call Ph#  [phone]

## 2019-01-20 ENCOUNTER — Other Ambulatory Visit: Payer: Self-pay

## 2019-01-20 ENCOUNTER — Other Ambulatory Visit (INDEPENDENT_AMBULATORY_CARE_PROVIDER_SITE_OTHER): Payer: No Typology Code available for payment source

## 2019-01-20 DIAGNOSIS — Z0000 Encounter for general adult medical examination without abnormal findings: Secondary | ICD-10-CM | POA: Diagnosis not present

## 2019-01-20 LAB — COMPREHENSIVE METABOLIC PANEL
ALT: 15 U/L (ref 0–35)
AST: 17 U/L (ref 0–37)
Albumin: 4.3 g/dL (ref 3.5–5.2)
Alkaline Phosphatase: 62 U/L (ref 39–117)
BUN: 18 mg/dL (ref 6–23)
CO2: 29 mEq/L (ref 19–32)
Calcium: 9.1 mg/dL (ref 8.4–10.5)
Chloride: 104 mEq/L (ref 96–112)
Creatinine, Ser: 0.6 mg/dL (ref 0.40–1.20)
GFR: 100.96 mL/min (ref >60.00)
Glucose, Bld: 97 mg/dL (ref 70–99)
Potassium: 4 mEq/L (ref 3.5–5.1)
Sodium: 139 mEq/L (ref 135–145)
Total Bilirubin: 0.8 mg/dL (ref 0.2–1.2)
Total Protein: 6.4 g/dL (ref 6.0–8.3)

## 2019-01-20 LAB — LIPID PANEL
Cholesterol: 164 mg/dL (ref 0–200)
HDL: 74.1 mg/dL (ref >39.00)
LDL Cholesterol: 81 mg/dL (ref 0–99)
NonHDL: 89.88
Total CHOL/HDL Ratio: 2
Triglycerides: 43 mg/dL (ref 0.0–149.0)
VLDL: 8.6 mg/dL (ref 0.0–40.0)

## 2019-01-20 LAB — CBC WITH DIFFERENTIAL/PLATELET
Basophils Absolute: 0.1 10*3/uL (ref 0.0–0.1)
Basophils Relative: 2 % (ref 0.0–3.0)
Eosinophils Absolute: 0.1 10*3/uL (ref 0.0–0.7)
Eosinophils Relative: 2.7 % (ref 0.0–5.0)
HCT: 39.5 % (ref 36.0–46.0)
Hemoglobin: 13.2 g/dL (ref 12.0–15.0)
Lymphocytes Relative: 28.9 % (ref 12.0–46.0)
Lymphs Abs: 1.6 10*3/uL (ref 0.7–4.0)
MCHC: 33.4 g/dL (ref 30.0–36.0)
MCV: 92.5 fl (ref 78.0–100.0)
Monocytes Absolute: 0.5 10*3/uL (ref 0.1–1.0)
Monocytes Relative: 8.9 % (ref 3.0–12.0)
Neutro Abs: 3.2 10*3/uL (ref 1.4–7.7)
Neutrophils Relative %: 57.5 % (ref 43.0–77.0)
Platelets: 288 10*3/uL (ref 150.0–400.0)
RBC: 4.27 Mil/uL (ref 3.87–5.11)
RDW: 12.7 % (ref 11.5–15.5)
WBC: 5.5 10*3/uL (ref 4.0–10.5)

## 2019-01-20 LAB — LDL CHOLESTEROL, DIRECT: Direct LDL: 74 mg/dL

## 2019-01-20 LAB — TSH: TSH: 1.69 u[IU]/mL (ref 0.35–4.50)

## 2019-01-20 LAB — VITAMIN D 25 HYDROXY (VIT D DEFICIENCY, FRACTURES): VITD: 38.29 ng/mL (ref 30.00–100.00)

## 2019-01-23 ENCOUNTER — Ambulatory Visit: Payer: Self-pay | Admitting: Internal Medicine

## 2019-01-23 ENCOUNTER — Ambulatory Visit: Payer: No Typology Code available for payment source | Admitting: Internal Medicine

## 2019-01-24 ENCOUNTER — Other Ambulatory Visit: Payer: Self-pay

## 2019-01-26 ENCOUNTER — Encounter: Payer: Self-pay | Admitting: Internal Medicine

## 2019-01-26 ENCOUNTER — Ambulatory Visit (INDEPENDENT_AMBULATORY_CARE_PROVIDER_SITE_OTHER): Payer: No Typology Code available for payment source | Admitting: Internal Medicine

## 2019-01-26 ENCOUNTER — Other Ambulatory Visit (HOSPITAL_COMMUNITY)
Admission: RE | Admit: 2019-01-26 | Discharge: 2019-01-26 | Disposition: A | Discharge status: Home / Self Care | Payer: No Typology Code available for payment source | Source: Ambulatory Visit | Attending: Internal Medicine | Admitting: Internal Medicine

## 2019-01-26 ENCOUNTER — Other Ambulatory Visit: Payer: Self-pay

## 2019-01-26 VITALS — BP 130/80 | HR 83 | Temp 97.2°F | Resp 14 | Ht 64.0 in | Wt 128.6 lb

## 2019-01-26 DIAGNOSIS — Z0000 Encounter for general adult medical examination without abnormal findings: Secondary | ICD-10-CM | POA: Diagnosis not present

## 2019-01-26 DIAGNOSIS — R87610 Atypical squamous cells of undetermined significance on cytologic smear of cervix (ASC-US): Secondary | ICD-10-CM

## 2019-01-26 DIAGNOSIS — R319 Hematuria, unspecified: Secondary | ICD-10-CM

## 2019-01-26 DIAGNOSIS — N952 Postmenopausal atrophic vaginitis: Secondary | ICD-10-CM

## 2019-01-26 DIAGNOSIS — R87810 Cervical high risk human papillomavirus (HPV) DNA test positive: Secondary | ICD-10-CM | POA: Diagnosis present

## 2019-01-26 LAB — URINALYSIS, ROUTINE W REFLEX MICROSCOPIC
Bilirubin Urine: NEGATIVE
Ketones, ur: NEGATIVE
Leukocytes,Ua: NEGATIVE
Nitrite: NEGATIVE
Specific Gravity, Urine: 1.01 (ref 1.000–1.030)
Total Protein, Urine: NEGATIVE
Urine Glucose: NEGATIVE
Urobilinogen, UA: 0.2 (ref 0.0–1.0)
pH: 7 (ref 5.0–8.0)

## 2019-01-26 NOTE — Progress Notes (Signed)
Patient ID: Heather Watson, female    DOB: 31-Dec-1955  Age: 63 y.o. MRN: [id number]  The patient is here for annual preventive examination .  Marland KitchenThis visit occurred during the SARS-CoV-2 public health emergency.  Safety protocols were in place, including screening questions prior to the visit, additional usage of staff PPE, and extensive cleaning of exam room while observing appropriate contact time as indicated for disinfecting solutions.     The risk factors are reflected in the social history.  The roster of all physicians providing medical care to patient - is listed in the Snapshot section of the chart.  Activities of daily living:  The patient is 100% independent in all ADLs: dressing, toileting, feeding as well as independent mobility  Home safety : The patient has smoke detectors in the home. They wear seatbelts.  There are no firearms at home. There is no violence in the home.   There is no risks for hepatitis, STDs or HIV. There is no   history of blood transfusion. They have no travel history to infectious disease endemic areas of the world.  The patient has seen their dentist in the last six month. They have seen their eye doctor in the last year. They deny any  hearing difficulty.  They do not  have excessive sun exposure. Discussed the need for sun protection: hats, long sleeves and use of sunscreen if there is significant sun exposure.   Diet: the importance of a healthy diet is discussed. She does  have a healthy diet.  The benefits of regular aerobic exercise were discussed. She exercises 4 tiimes per week ,  60 minutes.   Depression screen: there are no signs or vegative symptoms of depression- irritability, change in appetite, anhedonia, sadness/tearfullness.  The following portions of the patient's history were reviewed and updated as appropriate: allergies, current medications, past family history, past medical history,  past surgical history, past social history  and  problem list.  Visual acuity was not assessed per patient preference since she has regular follow up with her ophthalmologist. Hearing and body mass index were assessed and reviewed.   During the course of the visit the patient was educated and counseled about appropriate screening and preventive services including : fall prevention , diabetes screening, nutrition counseling, colorectal cancer screening, and recommended immunizations.    CC: The primary encounter diagnosis was Postmenopausal atrophic vaginitis. Diagnoses of Hematuria, unspecified type, ASCUS with positive high risk HPV cervical, and Encounter for preventive health examination were also pertinent to this visit.  History Heather Watson has a past medical history of Gallstones and UTI (urinary tract infection).   She has a past surgical history that includes Colonoscopy with propofol (N/A, 05/19/2016).   Her family history includes Arthritis in her mother; Breast cancer (age of onset: 55) in her maternal aunt; Breast cancer (age of onset: 72) in her mother; Cancer in her sister; Heart attack in her maternal grandfather; Hypertension in her brother, brother, father, mother, and sister; Stroke in her father and paternal grandfather.She reports that she quit smoking about 37 years ago. Her smoking use included cigarettes. She has never used smokeless tobacco. She reports current alcohol use. She reports that she does not use drugs.  Outpatient Medications Prior to Visit  Medication Sig Dispense Refill  . Biotin 5000 MCG TABS Take 1 tablet by mouth daily.    . calcium gluconate 500 MG tablet Take 1 tablet by mouth daily.    . Cholecalciferol (VITAMIN D) 2000 UNITS tablet  Take 2,000 Units by mouth daily. Reported on 02/27/2015    . ELDERBERRY PO Take 1,000 mg by mouth daily.    . Ginger, Zingiber officinalis, (GINGER PO) Take 300 mg by mouth daily.    . magnesium gluconate (MAGONATE) 500 MG tablet Take 800 mg by mouth daily.    . meloxicam  (MOBIC) 7.5 MG tablet TAKE 1 TABLET (7.5 MG TOTAL) BY MOUTH DAILY AS NEEDED FOR JOINT PAIN 30 tablet 3  . OIL OF OREGANO PO Take 50 mg by mouth daily.    . Omega-3 1000 MG CAPS Take 1 capsule by mouth daily.    Marland Kitchen SHARK CARTILAGE PO Take 1,500 mg by mouth daily.    . vitamin E 400 UNIT capsule Take 400 Units by mouth daily.    . Zinc 10 MG LOZG Take 10 mg by mouth daily.    Marland Kitchen conjugated estrogens (PREMARIN) vaginal cream INSERT 1 GRAM VAGINALLY AT NIGHT TWICE A WEEK. (Patient not taking: Reported on 01/26/2019) 30 g 0  . Glucosamine-Chondroitin 500-400 MG CAPS Take 1 capsule by mouth daily.    . phenazopyridine (PYRIDIUM) 200 MG tablet Take 1 tablet (200 mg total) by mouth 3 (three) times daily as needed for pain. (Patient not taking: Reported on 01/26/2019) 10 tablet 0   No facility-administered medications prior to visit.    Review of Systems   Patient denies headache, fevers, malaise, unintentional weight loss, skin rash, eye pain, sinus congestion and sinus pain, sore throat, dysphagia,  hemoptysis , cough, dyspnea, wheezing, chest pain, palpitations, orthopnea, edema, abdominal pain, nausea, melena, diarrhea, constipation, flank pain, dysuria, hematuria, urinary  Frequency, nocturia, numbness, tingling, seizures,  Focal weakness, Loss of consciousness,  Tremor, insomnia, depression, anxiety, and suicidal ideation.      Objective:  BP 130/80 (BP Location: Left Arm, Patient Position: Sitting, Cuff Size: Normal)   Pulse 83   Temp (!) 97.2 F (36.2 C) (Temporal)   Resp 14   Ht 5\' 4"  (1.626 m)   Wt 128 lb 9.6 oz (58.3 kg)   LMP 04/12/2006   SpO2 99%   BMI 22.07 kg/m   Physical Exam  General Appearance:    Alert, cooperative, no distress, appears stated age  Head:    Normocephalic, without obvious abnormality, atraumatic  Eyes:    PERRL, conjunctiva/corneas clear, EOM's intact, fundi    benign, both eyes  Ears:    Normal TM's and external ear canals, both ears  Nose:   Nares  normal, septum midline, mucosa normal, no drainage    or sinus tenderness  Throat:   Lips, mucosa, and tongue normal; teeth and gums normal  Neck:   Supple, symmetrical, trachea midline, no adenopathy;    thyroid:  no enlargement/tenderness/nodules; no carotid   bruit or JVD  Back:     Symmetric, no curvature, ROM normal, no CVA tenderness  Lungs:     Clear to auscultation bilaterally, respirations unlabored  Chest Wall:    No tenderness or deformity   Heart:    Regular rate and rhythm, S1 and S2 normal, no murmur, rub   or gallop  Breast Exam:    No tenderness, masses, or nipple abnormality  Abdomen:     Soft, non-tender, bowel sounds active all four quadrants,    no masses, no organomegaly  Genitalia:    Pelvic: cervix normal in appearance, external genitalia normal, no adnexal masses or tenderness, no cervical motion tenderness, rectovaginal septum normal, uterus normal size, shape, and consistency and vagina normal  without discharge  Extremities:   Extremities normal, atraumatic, no cyanosis or edema  Pulses:   2+ and symmetric all extremities  Skin:   Skin color, texture, turgor normal, no rashes or lesions  Lymph nodes:   Cervical, supraclavicular, and axillary nodes normal  Neurologic:   CNII-XII intact, normal strength, sensation and reflexes    throughout    Assessment & Plan:   Problem List Items Addressed This Visit      Unprioritized   ASCUS with positive high risk HPV cervical    She did not see GYN last year for evaluation. . Repeat PAP smear today and GYN referral made.       Relevant Orders   Cytology - PAP( [place])   Ambulatory referral to Gynecology   Encounter for preventive health examination    age appropriate education and counseling updated, referrals for preventative services and immunizations addressed, dietary and smoking counseling addressed, most recent labs reviewed.  I have personally reviewed and have noted:  1) the patient's medical and  social history 2) The pt's use of alcohol, tobacco, and illicit drugs 3) The patient's current medications and supplements 4) Functional ability including ADL's, fall risk, home safety risk, hearing and visual impairment 5) Diet and physical activities 6) Evidence for depression or mood disorder 7) The patient's height, weight, and BMI have been recorded in the chart  I have made referrals, and provided counseling and education based on review of the above      Postmenopausal atrophic vaginitis - Primary    Improved symptoms with use of Premarin.  Exam is normal.  Discussed the relative risks of vaginal estrogen and encouraged continued use to prevent recurrent symptoms of vaginitis and dysuria        Other Visit Diagnoses    Hematuria, unspecified type       Relevant Orders   Urinalysis, Routine w reflex microscopic ([facility]) (Completed)      I have discontinued Ladine C. Ayad's Glucosamine-Chondroitin, conjugated estrogens, and phenazopyridine. I am also having her maintain her Vitamin D, magnesium gluconate, calcium gluconate, Omega-3, meloxicam, SHARK CARTILAGE PO, ELDERBERRY PO, (Ginger, Zingiber officinalis, (GINGER PO)), Zinc, Biotin, OIL OF OREGANO PO, and vitamin E.  No orders of the defined types were placed in this encounter.   Medications Discontinued During This Encounter  Medication Reason  . conjugated estrogens (PREMARIN) vaginal cream Patient has not taken in last 30 days  . Glucosamine-Chondroitin 500-400 MG CAPS Patient has not taken in last 30 days  . phenazopyridine (PYRIDIUM) 200 MG tablet Patient has not taken in last 30 days    Follow-up: No follow-ups on file.   Sherlene Shamseresa L Luxe Cuadros, MD

## 2019-01-26 NOTE — Patient Instructions (Signed)
I am making a referral to Dr Vikki Ports Ward [facility] to follow up on the PAP smear and the vaginal polyp  I will notify you of the results of the current PAP smear by Mychart and with a phone call from office on Monday    Health Maintenance for Postmenopausal Women Menopause is a normal process in which your ability to get pregnant comes to an end. This process happens slowly over many months or years, usually between the ages of 52 and 109. Menopause is complete when you have missed your menstrual periods for 12 months. It is important to talk with your health care provider about some of the most common conditions that affect women after menopause (postmenopausal women). These include heart disease, cancer, and bone loss (osteoporosis). Adopting a healthy lifestyle and getting preventive care can help to promote your health and wellness. The actions you take can also lower your chances of developing some of these common conditions. What should I know about menopause? During menopause, you may get a number of symptoms, such as:  Hot flashes. These can be moderate or severe.  Night sweats.  Decrease in sex drive.  Mood swings.  Headaches.  Tiredness.  Irritability.  Memory problems.  Insomnia. Choosing to treat or not to treat these symptoms is a decision that you make with your health care provider. Do I need hormone replacement therapy?  Hormone replacement therapy is effective in treating symptoms that are caused by menopause, such as hot flashes and night sweats.  Hormone replacement carries certain risks, especially as you become older. If you are thinking about using estrogen or estrogen with progestin, discuss the benefits and risks with your health care provider. What is my risk for heart disease and stroke? The risk of heart disease, heart attack, and stroke increases as you age. One of the causes may be a change in the body's hormones during menopause. This can  affect how your body uses dietary fats, triglycerides, and cholesterol. Heart attack and stroke are medical emergencies. There are many things that you can do to help prevent heart disease and stroke. Watch your blood pressure  High blood pressure causes heart disease and increases the risk of stroke. This is more likely to develop in people who have high blood pressure readings, are of African descent, or are overweight.  Have your blood pressure checked: ? Every 3-5 years if you are 104-47 years of age. ? Every year if you are 38 years old or older. Eat a healthy diet   Eat a diet that includes plenty of vegetables, fruits, low-fat dairy products, and lean protein.  Do not eat a lot of foods that are high in solid fats, added sugars, or sodium. Get regular exercise Get regular exercise. This is one of the most important things you can do for your health. Most adults should:  Try to exercise for at least 150 minutes each week. The exercise should increase your heart rate and make you sweat (moderate-intensity exercise).  Try to do strengthening exercises at least twice each week. Do these in addition to the moderate-intensity exercise.  Spend less time sitting. Even light physical activity can be beneficial. Other tips  Work with your health care provider to achieve or maintain a healthy weight.  Do not use any products that contain nicotine or tobacco, such as cigarettes, e-cigarettes, and chewing tobacco. If you need help quitting, ask your health care provider.  Know your numbers. Ask your health care provider  to check your cholesterol and your blood sugar (glucose). Continue to have your blood tested as directed by your health care provider. Do I need screening for cancer? Depending on your health history and family history, you may need to have cancer screening at different stages of your life. This may include screening for:  Breast cancer.  Cervical cancer.  Lung cancer.   Colorectal cancer. What is my risk for osteoporosis? After menopause, you may be at increased risk for osteoporosis. Osteoporosis is a condition in which bone destruction happens more quickly than new bone creation. To help prevent osteoporosis or the bone fractures that can happen because of osteoporosis, you may take the following actions:  If you are 88-28 years old, get at least 1,000 mg of calcium and at least 600 mg of vitamin D per day.  If you are older than age 40 but younger than age 60, get at least 1,200 mg of calcium and at least 600 mg of vitamin D per day.  If you are older than age 47, get at least 1,200 mg of calcium and at least 800 mg of vitamin D per day. Smoking and drinking excessive alcohol increase the risk of osteoporosis. Eat foods that are rich in calcium and vitamin D, and do weight-bearing exercises several times each week as directed by your health care provider. How does menopause affect my mental health? Depression may occur at any age, but it is more common as you become older. Common symptoms of depression include:  Low or sad mood.  Changes in sleep patterns.  Changes in appetite or eating patterns.  Feeling an overall lack of motivation or enjoyment of activities that you previously enjoyed.  Frequent crying spells. Talk with your health care provider if you think that you are experiencing depression. General instructions See your health care provider for regular wellness exams and vaccines. This may include:  Scheduling regular health, dental, and eye exams.  Getting and maintaining your vaccines. These include: ? Influenza vaccine. Get this vaccine each year before the flu season begins. ? Pneumonia vaccine. ? Shingles vaccine. ? Tetanus, diphtheria, and pertussis (Tdap) booster vaccine. Your health care provider may also recommend other immunizations. Tell your health care provider if you have ever been abused or do not feel safe at home.  Summary  Menopause is a normal process in which your ability to get pregnant comes to an end.  This condition causes hot flashes, night sweats, decreased interest in sex, mood swings, headaches, or lack of sleep.  Treatment for this condition may include hormone replacement therapy.  Take actions to keep yourself healthy, including exercising regularly, eating a healthy diet, watching your weight, and checking your blood pressure and blood sugar levels.  Get screened for cancer and depression. Make sure that you are up to date with all your vaccines. This information is not intended to replace advice given to you by your health care provider. Make sure you discuss any questions you have with your health care provider. Document Released: 03/27/2005 Document Revised: 01/26/2018 Document Reviewed: 01/26/2018 Elsevier Patient Education  2020 [employer].

## 2019-01-29 DIAGNOSIS — R87610 Atypical squamous cells of undetermined significance on cytologic smear of cervix (ASC-US): Secondary | ICD-10-CM | POA: Insufficient documentation

## 2019-01-29 NOTE — Assessment & Plan Note (Signed)
She did not see GYN last year for evaluation. . Repeat PAP smear today and GYN referral made.

## 2019-01-29 NOTE — Assessment & Plan Note (Signed)
Improved symptoms with use of Premarin.  Exam is normal.  Discussed the relative risks of vaginal estrogen and encouraged continued use to prevent recurrent symptoms of vaginitis and dysuria

## 2019-01-29 NOTE — Assessment & Plan Note (Signed)

## 2019-01-30 LAB — CYTOLOGY - PAP
Comment: NEGATIVE
Diagnosis: NEGATIVE
High risk HPV: POSITIVE — AB

## 2019-01-31 ENCOUNTER — Telehealth: Payer: Self-pay

## 2019-01-31 DIAGNOSIS — R87810 Cervical high risk human papillomavirus (HPV) DNA test positive: Secondary | ICD-10-CM

## 2019-01-31 DIAGNOSIS — R87610 Atypical squamous cells of undetermined significance on cytologic smear of cervix (ASC-US): Secondary | ICD-10-CM

## 2019-01-31 NOTE — Telephone Encounter (Signed)
Copied from [place] [id number]. Topic: Quick Communication - See Telephone Encounter >> Jan 31, 2019  9:26 AM Loma Boston wrote: CRM for notification. See Telephone encounter for: 01/31/19.Type Date User Summary Attachment General 01/31/2019 9:06 AM Paschal, Coralee Pesa - - Note   Patient is wanting to see Dr. Leonides Schanz. She is no longer with our office. She is with [facility] OB[facility]. CC from referral. Pls note that pt referral was sent to [facility] side for Dr Leonides Schanz, She needed the referral to go to [facility]

## 2019-01-31 NOTE — Addendum Note (Signed)
Addended by: Crecencio Mc on: 01/31/2019 11:17 AM   Modules accepted: Orders

## 2019-01-31 NOTE — Telephone Encounter (Signed)
Spoke with pt to let her know that the referral has been changed and sent to [facility]. Pt gave a verbal understanding.

## 2019-01-31 NOTE — Telephone Encounter (Signed)
Referral corrected

## 2019-02-03 ENCOUNTER — Ambulatory Visit
Admission: RE | Admit: 2019-02-03 | Discharge: 2019-02-03 | Disposition: A | Discharge status: Home / Self Care | Payer: No Typology Code available for payment source | Source: Ambulatory Visit | Attending: Internal Medicine | Admitting: Internal Medicine

## 2019-02-03 DIAGNOSIS — Z1231 Encounter for screening mammogram for malignant neoplasm of breast: Secondary | ICD-10-CM | POA: Diagnosis not present

## 2019-05-06 ENCOUNTER — Ambulatory Visit (INDEPENDENT_AMBULATORY_CARE_PROVIDER_SITE_OTHER)
Admission: RE | Admit: 2019-05-06 | Discharge: 2019-05-06 | Disposition: A | Discharge status: Home / Self Care | Payer: No Typology Code available for payment source | Source: Ambulatory Visit

## 2019-05-06 DIAGNOSIS — R350 Frequency of micturition: Secondary | ICD-10-CM | POA: Diagnosis not present

## 2019-05-06 DIAGNOSIS — R3915 Urgency of urination: Secondary | ICD-10-CM | POA: Diagnosis not present

## 2019-05-06 MED ORDER — PHENAZOPYRIDINE HCL 200 MG PO TABS: 200.0000 mg | ORAL_TABLET | Freq: Three times a day (TID) | ORAL | 0 refills | Status: DC

## 2019-05-06 MED ORDER — CEPHALEXIN 500 MG PO CAPS: 500.0000 mg | ORAL_CAPSULE | Freq: Two times a day (BID) | ORAL | 0 refills | Status: DC

## 2019-05-06 NOTE — ED Provider Notes (Signed)
Virtual Visit via Video Note:  Heather Watson  initiated request for Telemedicine visit with [facility] team. I connected with Heather Watson  on 05/06/2019 at 10:04 AM  for a synchronized telemedicine visit using a video enabled HIPPA compliant telemedicine application. I verified that I am speaking with Heather Watson  using two identifiers. Kesha Hurrell Doree Fudge, PA-C  was physically located in a [facility] Urgent care site and MARQUASHA BRUTUS was located at a different location.   The limitations of evaluation and management by telemedicine as well as the availability of in-person appointments were discussed. Patient was informed that she  may incur a bill ( including co-pay) for this virtual visit encounter. Heather Watson  expressed understanding and gave verbal consent to proceed with virtual visit.   History of Present Illness:Heather Watson  is a 64 y.o. female presents with 1 day history of urinary frequency, urinary urgency. Denies dysuria, hematuria. Denies abdominal pain, nausea, vomiting, diarrhea. Does have chills during urination. Denies fever, flank pain. Denies vaginal symptoms such as discharge, itching, spotting. Postmenopausal.   Patient states history of yearly UTI, usually during stress. States she currently is stressed. Denies ever being told having resistance to abx requiring change in medications.   Past Medical History:  Diagnosis Date  . Gallstones   . UTI (urinary tract infection)     Allergies  Allergen Reactions  . Iodine Rash and Itching    Contrast through IV  . Soy Allergy         Observations/Objective: General: Well appearing, nontoxic, no acute distress. Sitting comfortably. Head: Normocephalic, atraumatic Eye: No conjunctival injection, eyelid swelling. EOMI ENT: Mucus membranes moist, no lip cracking. No obvious nasal drainage. Pulm: Speaking in full sentences without difficulty. Normal effort. No respiratory distress,  accessory muscle use. Neuro: Normal mental status. Alert and oriented x 3.   Assessment and Plan: We will treat clinically for cystitis based off symptoms.  Start Keflex as directed.  Push fluids.  Discussed with patient, if symptoms does not improve, or returns after course of antibiotics, will need to be seen in person.  Otherwise return precautions given.  Patient expresses understanding and agrees to plan.   Follow Up Instructions:    I discussed the assessment and treatment plan with the patient. The patient was provided an opportunity to ask questions and all were answered. The patient agreed with the plan and demonstrated an understanding of the instructions.   The patient was advised to call back or seek an in-person evaluation if the symptoms worsen or if the condition fails to improve as anticipated.  I provided 20 minutes of non-face-to-face time during this encounter.    Heather Fisher, PA-C  05/06/2019 10:04 AM         Heather Fisher, PA-C 05/06/19 1004

## 2019-05-06 NOTE — Discharge Instructions (Addendum)
As discussed, treating you clinically for urinary tract infection based off of your symptoms.  Start Keflex as directed. Keep hydrated, urine should be clear to pale yellow in color.  If symptoms not improving, or returns after you finish your antibiotics, will need to be evaluated in person. Monitor for any worsening of symptoms, fever, worsening abdominal pain, nausea/vomiting, flank pain, follow up for reevaluation.   [facility] [place] [facility] [address], [place], [place] [address] [phone]  [facility] [place] [address], , [place] [address] [phone]  [facility] [place] [address] [place], [place] [address] [phone]  [facility] [place] [address], [address], [place], [place] [address] [phone]  [facility] [place] [address], [place], [place] [address] [phone]   [facility] [place] [address], [place],  [address] [phone]  

## 2019-12-08 ENCOUNTER — Telehealth: Payer: Self-pay

## 2019-12-08 DIAGNOSIS — Z0000 Encounter for general adult medical examination without abnormal findings: Secondary | ICD-10-CM

## 2019-12-08 NOTE — Telephone Encounter (Signed)
Pt will need lab orders for cpe on 01/29/20. I went ahead an scheduled an appt on 01/25/20 since patient insisted she needs her lab appointment before her physical.

## 2019-12-12 NOTE — Addendum Note (Signed)
Addended by: Sandy Salaam on: 12/12/2019 03:58 PM   Modules accepted: Orders

## 2019-12-12 NOTE — Telephone Encounter (Signed)
Pt would like to have her labs done prior to her physical in December. I have ordered lipid panel, cmp, tsh, cbc, and A1c. Is there anything else that needs to be ordered?

## 2019-12-13 NOTE — Telephone Encounter (Signed)
Labs have been ordered for lab appt.  

## 2019-12-14 ENCOUNTER — Other Ambulatory Visit: Payer: Self-pay | Admitting: Internal Medicine

## 2019-12-14 DIAGNOSIS — Z1231 Encounter for screening mammogram for malignant neoplasm of breast: Secondary | ICD-10-CM

## 2019-12-15 ENCOUNTER — Other Ambulatory Visit: Payer: Self-pay

## 2019-12-15 ENCOUNTER — Other Ambulatory Visit: Payer: Self-pay | Admitting: Nurse Practitioner

## 2019-12-15 ENCOUNTER — Encounter: Payer: Self-pay | Admitting: Nurse Practitioner

## 2019-12-15 ENCOUNTER — Ambulatory Visit (INDEPENDENT_AMBULATORY_CARE_PROVIDER_SITE_OTHER): Payer: No Typology Code available for payment source

## 2019-12-15 ENCOUNTER — Ambulatory Visit (INDEPENDENT_AMBULATORY_CARE_PROVIDER_SITE_OTHER): Payer: No Typology Code available for payment source | Admitting: Nurse Practitioner

## 2019-12-15 VITALS — BP 110/70 | HR 82 | Temp 98.0°F | Ht 64.0 in | Wt 128.0 lb

## 2019-12-15 DIAGNOSIS — M545 Low back pain, unspecified: Secondary | ICD-10-CM

## 2019-12-15 MED ORDER — MELOXICAM 7.5 MG PO TABS: 7.5000 mg | ORAL_TABLET | Freq: Every day | ORAL | 0 refills | Status: AC

## 2019-12-15 MED ORDER — CYCLOBENZAPRINE HCL 5 MG PO TABS: 5.0000 mg | ORAL_TABLET | Freq: Two times a day (BID) | ORAL | 0 refills | Status: DC | PRN

## 2019-12-15 NOTE — Patient Instructions (Addendum)
Please go to Xray today.   I have ordered the meloxicam  7.5 mg 1 pill daily for 1 week. I also ordered Flexeril 5 mg to take up to 2 times a day as needed for muscle spasm. This medication may make you drowsy.  You may also take Tylenol Arthritis in addition to the meloxicam  if needed.   Please use ice alternating heat as we discussed.  [place] pain patch may be used if pain is persisting and follow package directions as this is sold over-the-counter.  Gentle stretching exercises, and I would hold off on doing the elliptical and the treadmill for the next a week  to settle down  muscle irritation. You may do plain walking without an incline.  Avoid sleeping on your stomach.  Please call the office in 1 week for symptom update.   Acute Back Pain, Adult Acute back pain is sudden and usually short-lived. It is often caused by an injury to the muscles and tissues in the back. The injury may result from:  A muscle or ligament getting overstretched or torn (strained). Ligaments are tissues that connect bones to each other. Lifting something improperly can cause a back strain.  Wear and tear (degeneration) of the spinal disks. Spinal disks are circular tissue that provides cushioning between the bones of the spine (vertebrae).  Twisting motions, such as while playing sports or doing yard work.  A hit to the back.  Arthritis. You may have a physical exam, lab tests, and imaging tests to find the cause of your pain. Acute back pain usually goes away with rest and home care. Follow these instructions at home: Managing pain, stiffness, and swelling  Take over-the-counter and prescription medicines only as told by your health care provider.  Your health care provider may recommend applying ice during the first 24-48 hours after your pain starts. To do this: ? Put ice in a plastic bag. ? Place a towel between your skin and the bag. ? Leave the ice on for 20 minutes, 2-3 times a  day.  If directed, apply heat to the affected area as often as told by your health care provider. Use the heat source that your health care provider recommends, such as a moist heat pack or a heating pad. ? Place a towel between your skin and the heat source. ? Leave the heat on for 20-30 minutes. ? Remove the heat if your skin turns bright red. This is especially important if you are unable to feel pain, heat, or cold. You have a greater risk of getting burned. Activity   Do not stay in bed. Staying in bed for more than 1-2 days can delay your recovery.  Sit up and stand up straight. Avoid leaning forward when you sit, or hunching over when you stand. ? If you work at a desk, sit close to it so you do not need to lean over. Keep your chin tucked in. Keep your neck drawn back, and keep your elbows bent at a right angle. Your arms should look like the letter "L." ? Sit high and close to the steering wheel when you drive. Add lower back (lumbar) support to your car seat, if needed.  Take short walks on even surfaces as soon as you are able. Try to increase the length of time you walk each day.  Do not sit, drive, or stand in one place for more than 30 minutes at a time. Sitting or standing for long periods of  time can put stress on your back.  Do not drive or use heavy machinery while taking prescription pain medicine.  Use proper lifting techniques. When you bend and lift, use positions that put less stress on your back: ? [place] your knees. ? Keep the load close to your body. ? Avoid twisting.  Exercise regularly as told by your health care provider. Exercising helps your back heal faster and helps prevent back injuries by keeping muscles strong and flexible.  Work with a physical therapist to make a safe exercise program, as recommended by your health care provider. Do any exercises as told by your physical therapist. Lifestyle  Maintain a healthy weight. Extra weight puts stress on  your back and makes it difficult to have good posture.  Avoid activities or situations that make you feel anxious or stressed. Stress and anxiety increase muscle tension and can make back pain worse. Learn ways to manage anxiety and stress, such as through exercise. General instructions  Sleep on a firm mattress in a comfortable position. Try lying on your side with your knees slightly bent. If you lie on your back, put a pillow under your knees.  Follow your treatment plan as told by your health care provider. This may include: ? Cognitive or behavioral therapy. ? Acupuncture or massage therapy. ? Meditation or yoga. Contact a health care provider if:  You have pain that is not relieved with rest or medicine.  You have increasing pain going down into your legs or buttocks.  Your pain does not improve after 2 weeks.  You have pain at night.  You lose weight without trying.  You have a fever or chills. Get help right away if:  You develop new bowel or bladder control problems.  You have unusual weakness or numbness in your arms or legs.  You develop nausea or vomiting.  You develop abdominal pain.  You feel faint. Summary  Acute back pain is sudden and usually short-lived.  Use proper lifting techniques. When you bend and lift, use positions that put less stress on your back.  Take over-the-counter and prescription medicines and apply heat or ice as directed by your health care provider. This information is not intended to replace advice given to you by your health care provider. Make sure you discuss any questions you have with your health care provider. Document Revised: 05/24/2018 Document Reviewed: 09/16/2016 Elsevier Patient Education  2020 [employer].

## 2019-12-15 NOTE — Progress Notes (Signed)
Established Patient Office Visit  Subjective:  Patient ID: Heather Watson, female    DOB: [birth year]  Age: 64 y.o. MRN: [id number]  CC:  Chief Complaint  Patient presents with  . Acute Visit    lower back pain    HPI Heather Watson is a 64 yo who presents for right lower back/side/hip pain onset last Thursday. No  injury. She recalled sleeping on her stomach, got up felt bilateral lumbar discomfort that lasted an hour a few days prior to her right sided pain. Then, a few days later she started to notice the pain more in her right lateral side. She believes the pain is worse when she lays supine and better when she is up walking. Twisting and turning makes it hurt worse. She has to put a pillow on her side to keep from rolling over onto her stomach during her sleep.   She has noted no pain down her right leg, numbness or tingling or sensation or strength difference. She took Motrin 600 to 800 mg three times a day for about 3 days and it eased the ache from a 5 out of 10  to 3 out of 10.  She has used Biofreeze for 3 days.She had not tried ice or heat. She tried stretching until yesterday. Despite the back pain- she has continued her work outs on the elliptical and treadmill with high steps this week. She thinks it may have been aggravating her back.  She can recall pain in the right lower back about 10 years ago from twisting to the right in her chair talking to her co-worker and it took her 6 months with physical therapy to resolve.   Past Medical History:  Diagnosis Date  . Gallstones   . UTI (urinary tract infection)     Past Surgical History:  Procedure Laterality Date  . COLONOSCOPY WITH PROPOFOL N/A 05/19/2016   Procedure: COLONOSCOPY WITH PROPOFOL;  Surgeon: Midge Minium, MD;  Location: [facility] ENDOSCOPY;  Service: Endoscopy;  Laterality: N/A;    Family History  Problem Relation Age of Onset  . Hypertension Mother   . Arthritis Mother        osteoarthritis  . Breast  cancer Mother 65  . Stroke Father   . Hypertension Father   . Cancer Sister        lymphoma  . Hypertension Sister   . Hypertension Brother   . Heart attack Maternal Grandfather   . Stroke Paternal Grandfather   . Hypertension Brother   . Breast cancer Maternal Aunt 53    Social History   Socioeconomic History  . Marital status: Married    Spouse name: Not on file  . Number of children: Not on file  . Years of education: Not on file  . Highest education level: Not on file  Occupational History  . Not on file  Tobacco Use  . Smoking status: Former Smoker    Types: Cigarettes    Quit date: 08/21/1981    Years since quitting: 38.3  . Smokeless tobacco: Never Used  Substance and Sexual Activity  . Alcohol use: Yes  . Drug use: No  . Sexual activity: Yes    Birth control/protection: Post-menopausal  Other Topics Concern  . Not on file  Social History Narrative  . Not on file   Social Determinants of Health   Financial Resource Strain:   . Difficulty of Paying Living Expenses: Not on file  Food Insecurity:   . Worried About  Running Out of Food in the Last Year: Not on file  . Ran Out of Food in the Last Year: Not on file  Transportation Needs:   . Lack of Transportation (Medical): Not on file  . Lack of Transportation (Non-Medical): Not on file  Physical Activity:   . Days of Exercise per Week: Not on file  . Minutes of Exercise per Session: Not on file  Stress:   . Feeling of Stress : Not on file  Social Connections:   . Frequency of Communication with Friends and Family: Not on file  . Frequency of Social Gatherings with Friends and Family: Not on file  . Attends Religious Services: Not on file  . Active Member of Clubs or Organizations: Not on file  . Attends BankerClub or Organization Meetings: Not on file  . Marital Status: Not on file  Intimate Partner Violence:   . Fear of Current or Ex-Partner: Not on file  . Emotionally Abused: Not on file  . Physically  Abused: Not on file  . Sexually Abused: Not on file    Outpatient Medications Prior to Visit  Medication Sig Dispense Refill  . Biotin 5000 MCG TABS Take 1 tablet by mouth daily.    . calcium gluconate 500 MG tablet Take 1 tablet by mouth daily.    . Cholecalciferol (VITAMIN D) 2000 UNITS tablet Take 2,000 Units by mouth daily. Reported on 02/27/2015    . Ginger, Zingiber officinalis, (GINGER PO) Take 300 mg by mouth daily.    . magnesium gluconate (MAGONATE) 500 MG tablet Take 800 mg by mouth daily.    . OIL OF OREGANO PO Take 50 mg by mouth daily.    . Omega-3 1000 MG CAPS Take 1 capsule by mouth daily.    . meloxicam (MOBIC) 7.5 MG tablet TAKE 1 TABLET (7.5 MG TOTAL) BY MOUTH DAILY AS NEEDED FOR JOINT PAIN 30 tablet 3  . cephALEXin (KEFLEX) 500 MG capsule Take 1 capsule (500 mg total) by mouth 2 (two) times daily. 10 capsule 0  . ELDERBERRY PO Take 1,000 mg by mouth daily.    . phenazopyridine (PYRIDIUM) 200 MG tablet Take 1 tablet (200 mg total) by mouth 3 (three) times daily. 6 tablet 0  . SHARK CARTILAGE PO Take 1,500 mg by mouth daily.    . vitamin E 400 UNIT capsule Take 400 Units by mouth daily.    . Zinc 10 MG LOZG Take 10 mg by mouth daily.     No facility-administered medications prior to visit.    Allergies  Allergen Reactions  . Iodine Rash and Itching    Contrast through IV  . Soy Allergy     Review of Systems  Constitutional: Negative for chills and fever.  Gastrointestinal: Negative.   Genitourinary: Negative.   Musculoskeletal: Positive for back pain.  Neurological: Negative for headaches.  Hematological: Negative for adenopathy. Does not bruise/bleed easily.      Objective:    Physical Exam Vitals reviewed.  Constitutional:      Appearance: Normal appearance. She is normal weight.  HENT:     Head: Normocephalic.  Cardiovascular:     Rate and Rhythm: Normal rate and regular rhythm.     Pulses: Normal pulses.     Heart sounds: Normal heart sounds.    Pulmonary:     Effort: Pulmonary effort is normal.     Breath sounds: Normal breath sounds.  Abdominal:     General: Abdomen is flat.  Palpations: Abdomen is soft.     Tenderness: There is no abdominal tenderness. There is no right CVA tenderness or left CVA tenderness.  Musculoskeletal:        General: Normal range of motion.     Cervical back: Normal range of motion and neck supple.     Comments: Tender right paraspinous to lateral side.  Excellent back and right hip ROM. No rash or bony deformity. Normal hip and non tender hip joint. Neg SLR.   Neurological:     General: No focal deficit present.     Mental Status: She is alert.     Motor: Motor function is intact. No weakness or abnormal muscle tone.     Gait: Gait is intact.     Deep Tendon Reflexes: Reflexes are normal and symmetric.     Reflex Scores:      Patellar reflexes are 2+ on the right side and 2+ on the left side.    BP 110/70 (BP Location: Left Arm, Patient Position: Sitting, Cuff Size: Normal)   Pulse 82   Temp 98 F (36.7 C) (Oral)   Ht 5\' 4"  (1.626 m)   Wt 128 lb (58.1 kg)   LMP 04/12/2006   SpO2 98%   BMI 21.97 kg/m  Wt Readings from Last 3 Encounters:  12/15/19 128 lb (58.1 kg)  01/26/19 128 lb 9.6 oz (58.3 kg)  07/14/18 129 lb 12.8 oz (58.9 kg)   Pulse Readings from Last 3 Encounters:  12/15/19 82  01/26/19 83  07/14/18 71    BP Readings from Last 3 Encounters:  12/15/19 110/70  01/26/19 130/80  07/14/18 105/70    Lab Results  Component Value Date   CHOL 164 01/20/2019   HDL 74.10 01/20/2019   LDLCALC 81 01/20/2019   LDLDIRECT 74.0 01/20/2019   TRIG 43.0 01/20/2019   CHOLHDL 2 01/20/2019      Health Maintenance Due  Topic Date Due  . COVID-19 Vaccine (1) Never done  . INFLUENZA VACCINE  09/17/2019    There are no preventive care reminders to display for this patient.  Lab Results  Component Value Date   TSH 1.69 01/20/2019   Lab Results  Component Value Date   WBC 5.5  01/20/2019   HGB 13.2 01/20/2019   HCT 39.5 01/20/2019   MCV 92.5 01/20/2019   PLT 288.0 01/20/2019   Lab Results  Component Value Date   NA 139 01/20/2019   K 4.0 01/20/2019   CO2 29 01/20/2019   GLUCOSE 97 01/20/2019   BUN 18 01/20/2019   CREATININE 0.60 01/20/2019   BILITOT 0.8 01/20/2019   ALKPHOS 62 01/20/2019   AST 17 01/20/2019   ALT 15 01/20/2019   PROT 6.4 01/20/2019   ALBUMIN 4.3 01/20/2019   CALCIUM 9.1 01/20/2019   GFR 100.96 01/20/2019   Lab Results  Component Value Date   CHOL 164 01/20/2019   Lab Results  Component Value Date   HDL 74.10 01/20/2019   Lab Results  Component Value Date   LDLCALC 81 01/20/2019   Lab Results  Component Value Date   TRIG 43.0 01/20/2019   Lab Results  Component Value Date   CHOLHDL 2 01/20/2019   Lab Results  Component Value Date   HGBA1C 5.3 12/26/2013      Assessment & Plan:   Problem List Items Addressed This Visit      Other   Lumbar back pain - Primary   Relevant Medications   meloxicam (MOBIC) 7.5  MG tablet   cyclobenzaprine (FLEXERIL) 5 MG tablet   Other Relevant Orders   DG Lumbar Spine Complete      Meds ordered this encounter  Medications  . meloxicam (MOBIC) 7.5 MG tablet    Sig: Take 1 tablet (7.5 mg total) by mouth daily for 7 days.    Dispense:  7 tablet    Refill:  0    Order Specific Question:   Supervising Provider    Answer:   Dale Nickerson [id number]  . cyclobenzaprine (FLEXERIL) 5 MG tablet    Sig: Take 1 tablet (5 mg total) by mouth 2 (two) times daily as needed for up to 7 days for muscle spasms.    Dispense:  14 tablet    Refill:  0    Order Specific Question:   Supervising Provider    Answer:   Dale Truxton [[id number]]   She is fit and strong. She does leg lifts, Elliptical,  treadmill. Her back pain is more muscle related than spine tenderness. Please go to Xray today to check her lumbar spine.   I have ordered the meloxicam  7.5 mg 1 pill daily for 1 week. I also  ordered Flexeril 5 mg to take up to 2 times a day as needed for muscle spasm. This medication may make you drowsy.  You may also take Tylenol Arthritis in addition to the meloxicam  if needed. Advised to stop all other NSAIDs- she voices understanding.   Please use ice alternating heat as we discussed.  [place] pain patch may be used if pain is persisting and follow package directions as this is sold over-the-counter.  Gentle stretching exercises, and I would hold off on doing the elliptical and the treadmill for the next a week  to settle down  muscle irritation. You may do plain walking without an incline.  Avoid sleeping on your stomach.   Please call the office in 1 week for symptom update.  Addendum:  Lumbar Xray results showing no fracture:  1. Mild levoscoliosis- lumbar spine curves to the right. Over time- this can strain muscles.  2. Moderate severity DDD at L5-S1 and this can cause back pain. I can refer her to Ortho if she desires.   Follow-up: Return if symptoms worsen or fail to improve.   This visit occurred during the SARS-CoV-2 public health emergency.  Safety protocols were in place, including screening questions prior to the visit, additional usage of staff PPE, and extensive cleaning of exam room while observing appropriate contact time as indicated for disinfecting solutions.   Amedeo Kinsman, NP

## 2019-12-17 ENCOUNTER — Encounter: Payer: Self-pay | Admitting: Nurse Practitioner

## 2020-01-25 ENCOUNTER — Other Ambulatory Visit (INDEPENDENT_AMBULATORY_CARE_PROVIDER_SITE_OTHER): Payer: No Typology Code available for payment source

## 2020-01-25 ENCOUNTER — Other Ambulatory Visit: Payer: Self-pay

## 2020-01-25 DIAGNOSIS — Z0000 Encounter for general adult medical examination without abnormal findings: Secondary | ICD-10-CM

## 2020-01-25 LAB — CBC WITH DIFFERENTIAL/PLATELET
Basophils Absolute: 0.1 10*3/uL (ref 0.0–0.1)
Basophils Relative: 2 % (ref 0.0–3.0)
Eosinophils Absolute: 0.1 10*3/uL (ref 0.0–0.7)
Eosinophils Relative: 1.7 % (ref 0.0–5.0)
HCT: 40.2 % (ref 36.0–46.0)
Hemoglobin: 13.5 g/dL (ref 12.0–15.0)
Lymphocytes Relative: 25.7 % (ref 12.0–46.0)
Lymphs Abs: 1.6 10*3/uL (ref 0.7–4.0)
MCHC: 33.7 g/dL (ref 30.0–36.0)
MCV: 92 fl (ref 78.0–100.0)
Monocytes Absolute: 0.5 10*3/uL (ref 0.1–1.0)
Monocytes Relative: 8.6 % (ref 3.0–12.0)
Neutro Abs: 3.8 10*3/uL (ref 1.4–7.7)
Neutrophils Relative %: 62 % (ref 43.0–77.0)
Platelets: 309 10*3/uL (ref 150.0–400.0)
RBC: 4.37 Mil/uL (ref 3.87–5.11)
RDW: 12.9 % (ref 11.5–15.5)
WBC: 6.1 10*3/uL (ref 4.0–10.5)

## 2020-01-25 LAB — COMPREHENSIVE METABOLIC PANEL
ALT: 18 U/L (ref 0–35)
AST: 19 U/L (ref 0–37)
Albumin: 4.4 g/dL (ref 3.5–5.2)
Alkaline Phosphatase: 66 U/L (ref 39–117)
BUN: 15 mg/dL (ref 6–23)
CO2: 30 mEq/L (ref 19–32)
Calcium: 9 mg/dL (ref 8.4–10.5)
Chloride: 103 mEq/L (ref 96–112)
Creatinine, Ser: 0.61 mg/dL (ref 0.40–1.20)
GFR: 94.73 mL/min (ref >60.00)
Glucose, Bld: 94 mg/dL (ref 70–99)
Potassium: 3.8 mEq/L (ref 3.5–5.1)
Sodium: 139 mEq/L (ref 135–145)
Total Bilirubin: 0.7 mg/dL (ref 0.2–1.2)
Total Protein: 7.1 g/dL (ref 6.0–8.3)

## 2020-01-25 LAB — HEMOGLOBIN A1C: Hgb A1c MFr Bld: 5.2 % (ref 4.6–6.5)

## 2020-01-25 LAB — LIPID PANEL
Cholesterol: 153 mg/dL (ref 0–200)
HDL: 75 mg/dL (ref >39.00)
LDL Cholesterol: 68 mg/dL (ref 0–99)
NonHDL: 77.84
Total CHOL/HDL Ratio: 2
Triglycerides: 48 mg/dL (ref 0.0–149.0)
VLDL: 9.6 mg/dL (ref 0.0–40.0)

## 2020-01-25 LAB — TSH: TSH: 1.31 u[IU]/mL (ref 0.35–4.50)

## 2020-01-28 NOTE — Progress Notes (Signed)
Attached are your recent fasting labs,  We will discuss in detail at your upcoming visit.  Regards,   Lakelynn Severtson, MD    

## 2020-01-29 ENCOUNTER — Other Ambulatory Visit: Payer: Self-pay

## 2020-01-29 ENCOUNTER — Encounter: Payer: Self-pay | Admitting: Internal Medicine

## 2020-01-29 ENCOUNTER — Ambulatory Visit (INDEPENDENT_AMBULATORY_CARE_PROVIDER_SITE_OTHER): Payer: No Typology Code available for payment source | Admitting: Internal Medicine

## 2020-01-29 ENCOUNTER — Telehealth: Payer: Self-pay | Admitting: Internal Medicine

## 2020-01-29 ENCOUNTER — Other Ambulatory Visit: Payer: Self-pay | Admitting: Internal Medicine

## 2020-01-29 VITALS — BP 118/72 | HR 73 | Temp 98.3°F | Ht 64.02 in | Wt 128.4 lb

## 2020-01-29 DIAGNOSIS — R8290 Unspecified abnormal findings in urine: Secondary | ICD-10-CM | POA: Diagnosis not present

## 2020-01-29 DIAGNOSIS — Z0000 Encounter for general adult medical examination without abnormal findings: Secondary | ICD-10-CM

## 2020-01-29 DIAGNOSIS — Z780 Asymptomatic menopausal state: Secondary | ICD-10-CM

## 2020-01-29 DIAGNOSIS — R87610 Atypical squamous cells of undetermined significance on cytologic smear of cervix (ASC-US): Secondary | ICD-10-CM | POA: Diagnosis not present

## 2020-01-29 DIAGNOSIS — Z8639 Personal history of other endocrine, nutritional and metabolic disease: Secondary | ICD-10-CM

## 2020-01-29 DIAGNOSIS — N952 Postmenopausal atrophic vaginitis: Secondary | ICD-10-CM | POA: Diagnosis not present

## 2020-01-29 DIAGNOSIS — E559 Vitamin D deficiency, unspecified: Secondary | ICD-10-CM

## 2020-01-29 DIAGNOSIS — R87810 Cervical high risk human papillomavirus (HPV) DNA test positive: Secondary | ICD-10-CM

## 2020-01-29 LAB — URINALYSIS, ROUTINE W REFLEX MICROSCOPIC
Bilirubin Urine: NEGATIVE
Ketones, ur: NEGATIVE
Leukocytes,Ua: NEGATIVE
Nitrite: NEGATIVE
Specific Gravity, Urine: 1.015 (ref 1.000–1.030)
Total Protein, Urine: NEGATIVE
Urine Glucose: NEGATIVE
Urobilinogen, UA: 0.2 (ref 0.0–1.0)
WBC, UA: NONE SEEN (ref 0–?)
pH: 7.5 (ref 5.0–8.0)

## 2020-01-29 MED ORDER — LEVOFLOXACIN 500 MG PO TABS: 500.0000 mg | ORAL_TABLET | Freq: Every day | ORAL | 0 refills | Status: DC

## 2020-01-29 MED ORDER — ESTROGENS, CONJUGATED 0.625 MG/GM VA CREA: 1.0000 | TOPICAL_CREAM | Freq: Every day | VAGINAL | 12 refills | Status: DC

## 2020-01-29 MED ORDER — PREDNISONE 10 MG PO TABS: ORAL_TABLET | ORAL | 0 refills | Status: DC

## 2020-01-29 MED ORDER — MELOXICAM 15 MG PO TABS: 15.0000 mg | ORAL_TABLET | Freq: Every day | ORAL | 5 refills | Status: DC

## 2020-01-29 NOTE — Assessment & Plan Note (Signed)
She was referred to Dr Elesa Massed.  Colposcopy was negative march 2021.  She will continue annual PAP smears with Dr Elesa Massed every January

## 2020-01-29 NOTE — Assessment & Plan Note (Signed)
Symptoms tolerated except for vaginal dryness which is interfering with intercourse.  Premarin cream refilled

## 2020-01-29 NOTE — Patient Instructions (Addendum)
Take a full strength aspirin the night before your trip to [place],  And repeat the aspirin dose the night before your return flight  Stay hydrated during the  flight.  Water is fine   Get up and stretch legs every 2-3 hours.   ALL OF THIS IS TO PREVENT BLOOD CLOTS    TAKE THE LEVAQUIN AND PREDNISONE TAPER WITH YOU TO [place]  THE LEVAQUIN/PREDNSIONE  CAN BE USED FOR SINUSITIS AND BRONCHITIS  THE PREDNISONE ALONE CAN BE USED IF YOU HAVE AN EPISODE WITH YOUR BACK , BUT DO NOT COMBINE WITH MELOXICAM (TOO HARD ON THE STOMACH)

## 2020-01-29 NOTE — Telephone Encounter (Addendum)
Patient want a labs before physical on 01-29-2021

## 2020-01-29 NOTE — Progress Notes (Signed)
Patient ID: Heather Watson, female    DOB: May 20, 1955  Age: 64 y.o. MRN: [id number]  The patient is here for annual wellness examination and management of other chronic and acute problems.   The risk factors are reflected in the social history.  The roster of all physicians providing medical care to patient - is listed in the Snapshot section of the chart.  Activities of daily living:  The patient is 100% independent in all ADLs: dressing, toileting, feeding as well as independent mobility  Home safety : The patient has smoke detectors in the home. They wear seatbelts.  There are no firearms at home. There is no violence in the home.   There is no risks for hepatitis, STDs or HIV. There is no   history of blood transfusion. They have no travel history to infectious disease endemic areas of the world.  The patient has seen their dentist in the last six month. They have seen their eye doctor in the last year. They admit to slight hearing difficulty with regard to whispered voices and some television programs.  They have deferred audiologic testing in the last year.  They do not  have excessive sun exposure. Discussed the need for sun protection: hats, long sleeves and use of sunscreen if there is significant sun exposure.   Diet: the importance of a healthy diet is discussed. They do have a healthy diet.  The benefits of regular aerobic exercise were discussed. She exercises 5 days per week ,  60 minutes.   Depression screen: there are no signs or vegative symptoms of depression- irritability, change in appetite, anhedonia, sadness/tearfullness.  Cognitive assessment: the patient manages all their financial and personal affairs and is actively engaged. They could relate day,date,year and events; recalled 2/3 objects at 3 minutes; performed clock-face test normally.  The following portions of the patient's history were reviewed and updated as appropriate: allergies, current medications, past  family history, past medical history,  past surgical history, past social history  and problem list.  Visual acuity was not assessed per patient preference since she has regular follow up with her ophthalmologist. Hearing and body mass index were assessed and reviewed.   During the course of the visit the patient was educated and counseled about appropriate screening and preventive services including : fall prevention , diabetes screening, nutrition counseling, colorectal cancer screening, and recommended immunizations.    CC: The primary encounter diagnosis was Abnormal urine odor. Diagnoses of Menopause, ASCUS with positive high risk HPV cervical, Encounter for preventive health examination, and Postmenopausal atrophic vaginitis were also pertinent to this visit.  1) strong urine odor,occurs  only in the morning. .  Drinks 100 ounces of water daily.  No dysuria,  No nocturia. Drinks coffee.  2) saw Heather Setters, NP recently  for back pain : given meloxicam for 7 day supply prescribed   Wants new rx for 30 days,  Using prn.   Not daily  3) vaginal dryness . Not having sex due to discomfort.. using premarin intermittently,  wants refill. 4) High risk HPV positive :  Referred to GYN,  colposcopy negative march 2021  .  Has follow up with dr ward in January. Remains very concerned about the origin of her infection as she has only had one sexual partner for life (her husband)  History Heather Watson has a past medical history of Gallstones and UTI (urinary tract infection).   She has a past surgical history that includes Colonoscopy with propofol (N/A, 05/19/2016).  Her family history includes Arthritis in her mother; Breast cancer (age of onset: 15) in her maternal aunt; Breast cancer (age of onset: 2) in her mother; Cancer in her sister; Heart attack in her maternal grandfather; Hypertension in her brother, brother, father, mother, and sister; Stroke in her father and paternal grandfather.She reports that she  quit smoking about 38 years ago. Her smoking use included cigarettes. She has never used smokeless tobacco. She reports current alcohol use. She reports that she does not use drugs.  Outpatient Medications Prior to Visit  Medication Sig Dispense Refill  . Biotin 5000 MCG TABS Take 1 tablet by mouth daily.    . calcium gluconate 500 MG tablet Take 1 tablet by mouth daily.    . Cholecalciferol (VITAMIN D) 2000 UNITS tablet Take 2,000 Units by mouth daily. Reported on 02/27/2015    . Ginger, Zingiber officinalis, (GINGER PO) Take 300 mg by mouth daily.    . magnesium gluconate (MAGONATE) 500 MG tablet Take 800 mg by mouth daily.    . OIL OF OREGANO PO Take 50 mg by mouth daily.    . Omega-3 1000 MG CAPS Take 1 capsule by mouth daily.     No facility-administered medications prior to visit.    Review of Systems   Patient denies headache, fevers, malaise, unintentional weight loss, skin rash, eye pain, sinus congestion and sinus pain, sore throat, dysphagia,  hemoptysis , cough, dyspnea, wheezing, chest pain, palpitations, orthopnea, edema, abdominal pain, nausea, melena, diarrhea, constipation, flank pain, dysuria, hematuria, urinary  Frequency, nocturia, numbness, tingling, seizures,  Focal weakness, Loss of consciousness,  Tremor, insomnia, depression, anxiety, and suicidal ideation.      Objective:  BP 118/72 (BP Location: Left Arm, Patient Position: Sitting)   Pulse 73   Temp 98.3 F (36.8 C)   Ht 5' 4.02" (1.626 m)   Wt 128 lb 6.4 oz (58.2 kg)   LMP 04/12/2006   SpO2 99%   BMI 22.03 kg/m   Physical Exam  General appearance: alert, cooperative and appears stated age Head: Normocephalic, without obvious abnormality, atraumatic Eyes: conjunctivae/corneas clear. PERRL, EOM's intact. Fundi benign. Ears: normal TM's and external ear canals both ears Nose: Nares normal. Septum midline. Mucosa normal. No drainage or sinus tenderness. Throat: lips, mucosa, and tongue normal; teeth and  gums normal Neck: no adenopathy, no carotid bruit, no JVD, supple, symmetrical, trachea midline and thyroid not enlarged, symmetric, no tenderness/mass/nodules Lungs: clear to auscultation bilaterally Breasts: normal appearance, no masses or tenderness Heart: regular rate and rhythm, S1, S2 normal, no murmur, click, rub or gallop Abdomen: soft, non-tender; bowel sounds normal; no masses,  no organomegaly Extremities: extremities normal, atraumatic, no cyanosis or edema Pulses: 2+ and symmetric Skin: Skin color, texture, turgor normal. No rashes or lesions Neurologic: Alert and oriented X 3, normal strength and tone. Normal symmetric reflexes. Normal coordination and gait.    Assessment & Plan:   Problem List Items Addressed This Visit      Unprioritized   Menopause    Symptoms tolerated except for vaginal dryness which is interfering with intercourse.  Premarin cream refilled       Postmenopausal atrophic vaginitis    Encouraged to resume use of Premarin on a regular basis       Encounter for preventive health examination    age appropriate education and counseling updated, referrals for preventative services and immunizations addressed, dietary and smoking counseling addressed, most recent labs reviewed.  I have personally reviewed and have noted:  1) the patient's medical and social history 2) The pt's use of alcohol, tobacco, and illicit drugs 3) The patient's current medications and supplements 4) Functional ability including ADL's, fall risk, home safety risk, hearing and visual impairment 5) Diet and physical activities 6) Evidence for depression or mood disorder 7) The patient's height, weight, and BMI have been recorded in the chart  I have made referrals, and provided counseling and education based on review of the above      ASCUS with positive high risk HPV cervical    She was referred to Dr Elesa Massed.  Colposcopy was negative march 2021.  She will continue annual PAP  smears with Dr Elesa Massed every January       Abnormal urine odor - Primary    Explained that in the absence of dysuria,  Flank pain and frequency (she has no nocturia),  UTI is not diagnosed or treated in the presence of bacteruria,  Unless she has pyuria on UA      Relevant Orders   Urinalysis, Routine w reflex microscopic (Completed)   Urine Culture      I am having Paxtyn C. Tomasso start on meloxicam, conjugated estrogens, levofloxacin, and predniSONE. I am also having her maintain her Vitamin D, magnesium gluconate, calcium gluconate, Omega-3, (Ginger, Zingiber officinalis, (GINGER PO)), Biotin, and OIL OF OREGANO PO.  Meds ordered this encounter  Medications  . meloxicam (MOBIC) 15 MG tablet    Sig: Take 1 tablet (15 mg total) by mouth daily.    Dispense:  30 tablet    Refill:  5  . conjugated estrogens (PREMARIN) vaginal cream    Sig: Place 1 Applicatorful vaginally daily.    Dispense:  42.5 g    Refill:  12  . levofloxacin (LEVAQUIN) 500 MG tablet    Sig: Take 1 tablet (500 mg total) by mouth daily.    Dispense:  7 tablet    Refill:  0  . predniSONE (DELTASONE) 10 MG tablet    Sig: 6 tablets on Day 1 , then reduce by 1 tablet daily until gone    Dispense:  21 tablet    Refill:  0    There are no discontinued medications.  Follow-up: No follow-ups on file.   Sherlene Shams, MD

## 2020-01-30 DIAGNOSIS — R8290 Unspecified abnormal findings in urine: Secondary | ICD-10-CM | POA: Insufficient documentation

## 2020-01-30 NOTE — Assessment & Plan Note (Signed)
Explained that in the absence of dysuria,  Flank pain and frequency (she has no nocturia),  UTI is not diagnosed or treated in the presence of bacteruria,  Unless she has pyuria on UA

## 2020-01-30 NOTE — Telephone Encounter (Signed)
Labs ordered.  They can not be ordered more than a year ahead, FYI

## 2020-01-30 NOTE — Assessment & Plan Note (Signed)
Encouraged to resume use of Premarin on a regular basis

## 2020-01-30 NOTE — Telephone Encounter (Signed)
Called the patient to schedule for labs before the next physical. Call could not be completed and I could not leave a message.

## 2020-01-30 NOTE — Assessment & Plan Note (Signed)

## 2020-01-31 NOTE — Telephone Encounter (Signed)
Spoke with patient and scheduled lab appt for 01/27/21 at 8:00 am

## 2020-02-01 ENCOUNTER — Encounter: Payer: No Typology Code available for payment source | Admitting: Internal Medicine

## 2020-02-01 LAB — URINE CULTURE
MICRO NUMBER:: 11309492
SPECIMEN QUALITY:: ADEQUATE

## 2020-02-01 NOTE — Progress Notes (Signed)
There is no evidence of UTI by urinalysis. Your  culture is growing bacteria,  but remember that all urine cultures will grow bacteria  when the urinalysis is normal, this represents colonization,  not infection..  If you develop burning,  pain or frequency,  then I will treat.  Regards,   Duncan Dull, MD

## 2020-02-15 ENCOUNTER — Ambulatory Visit
Admission: RE | Admit: 2020-02-15 | Discharge: 2020-02-15 | Disposition: A | Discharge status: Home / Self Care | Payer: No Typology Code available for payment source | Source: Ambulatory Visit | Attending: Internal Medicine | Admitting: Internal Medicine

## 2020-02-15 ENCOUNTER — Other Ambulatory Visit: Payer: Self-pay

## 2020-02-15 DIAGNOSIS — Z1231 Encounter for screening mammogram for malignant neoplasm of breast: Secondary | ICD-10-CM | POA: Insufficient documentation

## 2020-03-15 ENCOUNTER — Inpatient Hospital Stay: Payer: No Typology Code available for payment source | Attending: Oncology

## 2020-03-15 DIAGNOSIS — Z803 Family history of malignant neoplasm of breast: Secondary | ICD-10-CM

## 2020-03-19 ENCOUNTER — Inpatient Hospital Stay: Payer: No Typology Code available for payment source | Attending: Oncology | Admitting: Licensed Clinical Social Worker

## 2020-03-19 DIAGNOSIS — Z803 Family history of malignant neoplasm of breast: Secondary | ICD-10-CM

## 2020-03-20 ENCOUNTER — Encounter: Payer: Self-pay | Admitting: Licensed Clinical Social Worker

## 2020-03-20 DIAGNOSIS — Z803 Family history of malignant neoplasm of breast: Secondary | ICD-10-CM | POA: Insufficient documentation

## 2020-03-20 NOTE — Progress Notes (Signed)
REFERRING PROVIDER: Crecencio Mc, MD [address],  [place] [address]  PRIMARY PROVIDER:  Crecencio Mc, MD  PRIMARY REASON FOR VISIT:  1. Family history of breast cancer      HISTORY OF PRESENT ILLNESS:   Ms. Schaffer, a 65 y.o. female, was seen for a [facility] cancer genetics consultation at the request of Dr. Derrel Nip due to a family history of cancer.  Ms. Domingos presents to clinic today to discuss the possibility of a hereditary predisposition to cancer, genetic testing, and to further clarify her future cancer risks, as well as potential cancer risks for family members.    Ms. Nguyen is a 65 y.o. female with no personal history of cancer.  She reports having genetic testing that she believes was negative 4-5 years ago, copy of report not available.   CANCER HISTORY:  Oncology History   No history exists.     RISK FACTORS:  Menarche was at age 73.  First live birth at age 37.  OCP use for approximately 0 years.  Ovaries intact: yes.  Hysterectomy: no.  Menopausal status: postmenopausal.  HRT use: 4 years. (over 5 years ago) Colonoscopy: yes; normal. Mammogram within the last year: yes. Number of breast biopsies: 0. Up to date with pelvic exams: yes.   Past Medical History:  Diagnosis Date  . Family history of breast cancer   . Gallstones   . UTI (urinary tract infection)     Past Surgical History:  Procedure Laterality Date  . COLONOSCOPY WITH PROPOFOL N/A 05/19/2016   Procedure: COLONOSCOPY WITH PROPOFOL;  Surgeon: Lucilla Lame, MD;  Location: [facility] ENDOSCOPY;  Service: Endoscopy;  Laterality: N/A;    Social History   Socioeconomic History  . Marital status: Married    Spouse name: Not on file  . Number of children: Not on file  . Years of education: Not on file  . Highest education level: Not on file  Occupational History  . Not on file  Tobacco Use  . Smoking status: Former Smoker    Types: Cigarettes    Quit date:  08/21/1981    Years since quitting: 38.6  . Smokeless tobacco: Never Used  Substance and Sexual Activity  . Alcohol use: Yes  . Drug use: No  . Sexual activity: Yes    Birth control/protection: Post-menopausal  Other Topics Concern  . Not on file  Social History Narrative  . Not on file   Social Determinants of Health   Financial Resource Strain: Not on file  Food Insecurity: Not on file  Transportation Needs: Not on file  Physical Activity: Not on file  Stress: Not on file  Social Connections: Not on file     FAMILY HISTORY:  We obtained a detailed, 4-generation family history.  Significant diagnoses are listed below: Family History  Problem Relation Age of Onset  . Hypertension Mother   . Arthritis Mother        osteoarthritis  . Breast cancer Mother 75  . Cancer Mother        stomach/intestine  . Stroke Father   . Hypertension Father   . Cancer Sister        lymphoma  . Hypertension Sister   . Hypertension Brother   . Heart attack Maternal Grandfather   . Stroke Paternal Grandfather   . Hypertension Brother   . Breast cancer Maternal Aunt 35  . Cancer Paternal Uncle        unk type  Ms. Hursey has a daughter and 2 sons, no cancers. She had 2 sisters and 2 brothers. One sister had lymphoma and passed in her 40s.  Ms. Pecor mother had breast cancer at 45, and stomach/intestine cancer, and passed at 75. Patient had 2 maternal aunts and 2 maternal uncle, one aunt had breast cancer at 27. No known maternal cousins with cancer. Maternal grandmother passed at 54, grandfather passed at 57.  Ms. Fantini father died at 71. She is unaware of cancer on this side of the family aside from an uncle who possibly had cancer. Paternal grandmother died at 84, grandfather died at 71.   Ms. Suddeth is aware of previous family history of genetic testing for hereditary cancer risks. Patient's maternal ancestors are of [place] descent, and paternal ancestors are of  [place] descent. There is no reported Ashkenazi Jewish ancestry. There is no known consanguinity.    GENETIC COUNSELING ASSESSMENT: Ms. Ballengee is a 65 y.o. female with a family history of breast cancer which is somewhat suggestive of a hereditary cancer syndrome and predisposition to cancer. We, therefore, discussed and recommended the following at today's visit.   DISCUSSION: We discussed that approximately 5-10% of breast cancer is hereditary  Most cases of hereditary breast cancer are associated with BRCA1/BRCA2 genes, although there are other genes associated with hereditary breast cancer as well . We discussed that testing is beneficial for several reasons including  knowing about other cancer risks, identifying potential screening and risk-reduction options that may be appropriate, and to understand if other family members could be at risk for cancer and allow them to undergo genetic testing.   We reviewed the characteristics, features and inheritance patterns of hereditary cancer syndromes. We also discussed genetic testing, including the appropriate family members to test, the process of testing, insurance coverage and turn-around-time for results. We discussed the implications of a negative, positive and/or variant of uncertain significant result. We recommended Ms. Naron pursue genetic testing for the [facility] CancerNext-Expanded+RNA gene panel.  The CancerNext-Expanded + RNAinsight gene panel offered by [employer] and includes sequencing and rearrangement analysis for the following 77 genes: IP, ALK, APC*, ATM*, AXIN2, BAP1, BARD1, BLM, BMPR1A, BRCA1*, BRCA2*, BRIP1*, CDC73, CDH1*,CDK4, CDKN1B, CDKN2A, CHEK2*, CTNNA1, DICER1, FANCC, FH, FLCN, GALNT12, KIF1B, LZTR1, MAX, MEN1, MET, MLH1*, MSH2*, MSH3, MSH6*, MUTYH*, NBN, NF1*, NF2, NTHL1, PALB2*, PHOX2B, PMS2*, POT1, PRKAR1A, PTCH1, PTEN*, RAD51C*, RAD51D*,RB1, RECQL, RET, SDHA, SDHAF2, SDHB, SDHC, SDHD, SMAD4, SMARCA4, SMARCB1, SMARCE1,  STK11, SUFU, TMEM127, TP53*,TSC1, TSC2, VHL and XRCC2 (sequencing and deletion/duplication); EGFR, EGLN1, HOXB13, KIT, MITF, PDGFRA, POLD1 and POLE (sequencing only); EPCAM and GREM1 (deletion/duplication only).   Based on Ms. Laroche's family history of cancer, she meets medical criteria for genetic testing. Despite that she meets criteria, she may still have an out of pocket cost. We discussed that if her out of pocket cost for testing is over $100, the laboratory will call and confirm whether she wants to proceed with testing.  If the out of pocket cost of testing is less than $100 she will be billed by the genetic testing laboratory.   PLAN: After considering the risks, benefits, and limitations, Ms. Shutter provided informed consent to pursue genetic testing and the blood sample was sent to [facility] for analysis of the CancerNext-Expanded+RNA panel. Results should be available within approximately 2-3 weeks' time, at which point they will be disclosed by telephone to Ms. Rabel, as will any additional recommendations warranted by these results. Ms. Forney will receive a summary of  her genetic counseling visit and a copy of her results once available. This information will also be available in Epic.   Ms. Fern questions were answered to her satisfaction today. Our contact information was provided should additional questions or concerns arise. Thank you for the referral and allowing [place] to share in the care of your patient.   Faith Rogue, MS, [facility] Genetic Counselor [email or web address] Phone: [phone]  The patient was seen for a total of 15 minutes in face-to-face genetic counseling.  Dr. Grayland Ormond was available for discussion regarding this case.   _______________________________________________________________________ For Office Staff:  Number of people involved in session: 1 Was an Intern/ student involved with case: no

## 2020-04-03 ENCOUNTER — Telehealth: Payer: Self-pay | Admitting: Licensed Clinical Social Worker

## 2020-04-03 ENCOUNTER — Encounter: Payer: Self-pay | Admitting: Licensed Clinical Social Worker

## 2020-04-03 ENCOUNTER — Ambulatory Visit: Payer: Self-pay | Admitting: Licensed Clinical Social Worker

## 2020-04-03 DIAGNOSIS — Z1379 Encounter for other screening for genetic and chromosomal anomalies: Secondary | ICD-10-CM

## 2020-04-03 DIAGNOSIS — Z803 Family history of malignant neoplasm of breast: Secondary | ICD-10-CM

## 2020-04-03 NOTE — Telephone Encounter (Signed)
Revealed negative genetic testing.  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 This normal result is reassuring.  It is unlikely that there is an increased risk of cancer due to a mutation in one of these genes.  However, genetic testing is not perfect, and cannot definitively rule out a hereditary cause.  It will be important for her to keep in contact with genetics to learn if any additional testing may be needed in the future.

## 2020-04-03 NOTE — Progress Notes (Signed)
HPI:  Ms. Gullatt was previously seen in the [place] clinic due to a family history of cancer and concerns regarding a hereditary predisposition to cancer. Please refer to our prior cancer genetics clinic note for more information regarding our discussion, assessment and recommendations, at the time. Ms. Mandujano recent genetic test results were disclosed to her, as were recommendations warranted by these results. These results and recommendations are discussed in more detail below.  CANCER HISTORY:  Oncology History   No history exists.    FAMILY HISTORY:  We obtained a detailed, 4-generation family history.  Significant diagnoses are listed below: Family History  Problem Relation Age of Onset  . Hypertension Mother   . Arthritis Mother        osteoarthritis  . Breast cancer Mother 67  . Cancer Mother        stomach/intestine  . Stroke Father   . Hypertension Father   . Cancer Sister        lymphoma  . Hypertension Sister   . Hypertension Brother   . Heart attack Maternal Grandfather   . Stroke Paternal Grandfather   . Hypertension Brother   . Breast cancer Maternal Aunt 87  . Cancer Paternal Uncle        unk type   Ms. Hank has a daughter and 2 sons, no cancers. She had 2 sisters and 2 brothers. One sister had lymphoma and passed in her 36s.  Ms. Noguchi mother had breast cancer at 29, and stomach/intestine cancer, and passed at 48. Patient had 2 maternal aunts and 2 maternal uncle, one aunt had breast cancer at 82. No known maternal cousins with cancer. Maternal grandmother passed at 63, grandfather passed at 27.  Ms. Bonet father died at 60. She is unaware of cancer on this side of the family aside from an uncle who possibly had cancer. Paternal grandmother died at 17, grandfather died at 48.   Ms. Fryberger is aware of previous family history of genetic testing for hereditary cancer risks. Patient's maternal ancestors are of [place]  descent, and paternal ancestors are of [place] descent. There is no reported Ashkenazi Jewish ancestry. There is no known consanguinity.     GENETIC TEST RESULTS: Genetic testing reported out on 04/02/2020 through the [facility] CancerNext-Expanded+RNA cancer panel found no pathogenic mutations.   The CancerNext-Expanded + RNAinsight gene panel offered by [employer] and includes sequencing and rearrangement analysis for the following 77 genes: IP, ALK, APC*, ATM*, AXIN2, BAP1, BARD1, BLM, BMPR1A, BRCA1*, BRCA2*, BRIP1*, CDC73, CDH1*,CDK4, CDKN1B, CDKN2A, CHEK2*, CTNNA1, DICER1, FANCC, FH, FLCN, GALNT12, KIF1B, LZTR1, MAX, MEN1, MET, MLH1*, MSH2*, MSH3, MSH6*, MUTYH*, NBN, NF1*, NF2, NTHL1, PALB2*, PHOX2B, PMS2*, POT1, PRKAR1A, PTCH1, PTEN*, RAD51C*, RAD51D*,RB1, RECQL, RET, SDHA, SDHAF2, SDHB, SDHC, SDHD, SMAD4, SMARCA4, SMARCB1, SMARCE1, STK11, SUFU, TMEM127, TP53*,TSC1, TSC2, VHL and XRCC2 (sequencing and deletion/duplication); EGFR, EGLN1, HOXB13, KIT, MITF, PDGFRA, POLD1 and POLE (sequencing only); EPCAM and GREM1 (deletion/duplication only).  The test report has been scanned into EPIC and is located under the Molecular Pathology section of the Results Review tab.  A portion of the result report is included below for reference.     We discussed with Ms. Edgerly that because current genetic testing is not perfect, it is possible there may be a gene mutation in one of these genes that current testing cannot detect, but that chance is small.  We also discussed, that there could be another gene that has not yet been discovered, or that we have  not yet tested, that is responsible for the cancer diagnoses in the family. It is also possible there is a hereditary cause for the cancer in the family that Ms. Trevor did not inherit and therefore was not identified in her testing.  Therefore, it is important to remain in touch with cancer genetics in the future so that we can continue to offer Ms. Scerbo  the most up to date genetic testing.   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  At this time, it is unknown if these variants are associated with increased cancer risk or if they are normal findings, but most variants such as these get reclassified to being inconsequential. They should not be used to make medical management decisions. With time, we suspect the lab will determine the significance of these variants, if any. If we do learn more about them, we will try to contact Ms. Mckinney to discuss it further. However, it is important to stay in touch with [place] periodically and keep the address and phone number up to date.  ADDITIONAL GENETIC TESTING: We discussed with Ms. Darling that her genetic testing was fairly extensive.  If there are genes identified to increase cancer risk that can be analyzed in the future, we would be happy to discuss and coordinate this testing at that time.    CANCER SCREENING RECOMMENDATIONS: Ms. Helminiak test result is considered negative (normal).  This means that we have not identified a hereditary cause for her family history of cancer at this time.  While reassuring, this does not definitively rule out a hereditary predisposition to cancer. It is still possible that there could be genetic mutations that are undetectable by current technology. There could be genetic mutations in genes that have not been tested or identified to increase cancer risk.  Therefore, it is recommended she continue to follow the cancer management and screening guidelines provided by her  primary healthcare provider.   An individual's cancer risk and medical management are not determined by genetic test results alone. Overall cancer risk assessment incorporates additional factors, including personal medical history, family history, and any available genetic information that may result in a personalized plan for  cancer prevention and surveillance.  Based on Ms. Bockrath's personal and family history of cancer as well as her genetic test results, risk model Harriett Rush was used to estimate her risk of developing breast cancer. This estimates her lifetime risk of developing breast cancer to be approximately 12%.  The patient's lifetime breast cancer risk is a preliminary estimate based on available information using one of several models endorsed by the [place] (ACS). The ACS recommends consideration of breast MRI screening as an adjunct to mammography for patients at high risk (defined as 20% or greater lifetime risk).     RECOMMENDATIONS FOR FAMILY MEMBERS:  Relatives in this family might be at some increased risk of developing cancer, over the general population risk, simply due to the family history of cancer.  We recommended female relatives in this family have a yearly mammogram beginning at age 2, or 81 years younger than the earliest onset of cancer, an annual clinical breast exam, and perform monthly breast self-exams. Female relatives in this family should also have a gynecological exam as recommended by their primary provider.  All family members should be referred for colonoscopy starting at age 70.    It is also possible there is  a hereditary cause for the cancer in Ms. Luckey's family that she did not inherit and therefore was not identified in her.  Based on Ms. Lembo's family history, we recommended maternal relatives have genetic counseling and testing. Ms. Pundt will let us know if we can be of any assistance in coordinating genetic counseling and/or testing for these family members.  FOLLOW-UP: Lastly, we discussed with Ms. Zamarripa that cancer genetics is a rapidly advancing field and it is possible that new genetic tests will be appropriate for her and/or her family members in the future. We encouraged her to remain in contact with cancer genetics on an annual basis so  we can update her personal and family histories and let her know of advances in cancer genetics that may benefit this family.   Our contact number was provided. Ms. Dockery questions were answered to her satisfaction, and she knows she is welcome to call us at anytime with additional questions or concerns.   Faith Rogue, MS, [facility] Genetic Counselor [email or web address] Phone: [phone]

## 2020-04-05 ENCOUNTER — Other Ambulatory Visit: Payer: Self-pay | Admitting: Internal Medicine

## 2020-04-05 DIAGNOSIS — G8929 Other chronic pain: Secondary | ICD-10-CM

## 2020-04-19 ENCOUNTER — Ambulatory Visit: Payer: No Typology Code available for payment source | Admitting: Podiatry

## 2020-07-02 ENCOUNTER — Other Ambulatory Visit: Payer: Self-pay

## 2020-07-02 ENCOUNTER — Ambulatory Visit: Payer: No Typology Code available for payment source | Attending: Internal Medicine

## 2020-07-02 DIAGNOSIS — Z23 Encounter for immunization: Secondary | ICD-10-CM

## 2020-07-02 MED ORDER — PFIZER-BIONT COVID-19 VAC-TRIS 30 MCG/0.3ML IM SUSP
INTRAMUSCULAR | 0 refills | Status: DC
  Filled 2020-07-02: qty 0.3, 1d supply, fill #0

## 2020-07-02 NOTE — Progress Notes (Signed)
   Covid-19 Vaccination Clinic  Name:  KENADEE GATES    MRN: [id number] DOB: 04-01-1955  07/02/2020  Ms. Sandles was observed post Covid-19 immunization for 15 minutes without incident. She was provided with Vaccine Information Sheet and instruction to access the V-Safe system.   Ms. Oliver was instructed to call 911 with any severe reactions post vaccine: Marland Kitchen Difficulty breathing  . Swelling of face and throat  . A fast heartbeat  . A bad rash all over body  . Dizziness and weakness   Immunizations Administered    Name Date Dose VIS Date Route   PFIZER Comrnaty(Gray TOP) Covid-19 Vaccine 07/02/2020  1:34 PM 0.3 mL 01/25/2020 Intramuscular   Manufacturer: [employer], [employer]   Lot: [id number]   NDC: [address]-2     Drusilla Kanner, PharmD, MBA Clinical Acute Care Pharmacist

## 2020-09-23 DIAGNOSIS — R87610 Atypical squamous cells of undetermined significance on cytologic smear of cervix (ASC-US): Secondary | ICD-10-CM

## 2020-09-23 DIAGNOSIS — N952 Postmenopausal atrophic vaginitis: Secondary | ICD-10-CM

## 2020-09-23 DIAGNOSIS — R87810 Cervical high risk human papillomavirus (HPV) DNA test positive: Secondary | ICD-10-CM

## 2020-12-20 ENCOUNTER — Other Ambulatory Visit: Payer: Self-pay | Admitting: Internal Medicine

## 2020-12-20 DIAGNOSIS — Z1231 Encounter for screening mammogram for malignant neoplasm of breast: Secondary | ICD-10-CM

## 2021-01-24 ENCOUNTER — Telehealth: Payer: Self-pay | Admitting: *Deleted

## 2021-01-24 DIAGNOSIS — E559 Vitamin D deficiency, unspecified: Secondary | ICD-10-CM

## 2021-01-24 DIAGNOSIS — Z8639 Personal history of other endocrine, nutritional and metabolic disease: Secondary | ICD-10-CM

## 2021-01-24 DIAGNOSIS — E782 Mixed hyperlipidemia: Secondary | ICD-10-CM

## 2021-01-24 NOTE — Telephone Encounter (Signed)
Please place future orders for lab appt.  

## 2021-01-27 ENCOUNTER — Other Ambulatory Visit (INDEPENDENT_AMBULATORY_CARE_PROVIDER_SITE_OTHER): Payer: No Typology Code available for payment source

## 2021-01-27 ENCOUNTER — Other Ambulatory Visit: Payer: Self-pay

## 2021-01-27 DIAGNOSIS — E782 Mixed hyperlipidemia: Secondary | ICD-10-CM

## 2021-01-27 DIAGNOSIS — Z8639 Personal history of other endocrine, nutritional and metabolic disease: Secondary | ICD-10-CM

## 2021-01-27 DIAGNOSIS — E559 Vitamin D deficiency, unspecified: Secondary | ICD-10-CM

## 2021-01-27 LAB — LIPID PANEL
Cholesterol: 166 mg/dL (ref 0–200)
HDL: 78.8 mg/dL (ref >39.00)
LDL Cholesterol: 76 mg/dL (ref 0–99)
NonHDL: 86.77
Total CHOL/HDL Ratio: 2
Triglycerides: 54 mg/dL (ref 0.0–149.0)
VLDL: 10.8 mg/dL (ref 0.0–40.0)

## 2021-01-27 LAB — COMPREHENSIVE METABOLIC PANEL
ALT: 16 U/L (ref 0–35)
AST: 19 U/L (ref 0–37)
Albumin: 4.2 g/dL (ref 3.5–5.2)
Alkaline Phosphatase: 76 U/L (ref 39–117)
BUN: 18 mg/dL (ref 6–23)
CO2: 29 mEq/L (ref 19–32)
Calcium: 9.1 mg/dL (ref 8.4–10.5)
Chloride: 103 mEq/L (ref 96–112)
Creatinine, Ser: 0.57 mg/dL (ref 0.40–1.20)
GFR: 95.62 mL/min (ref >60.00)
Glucose, Bld: 80 mg/dL (ref 70–99)
Potassium: 3.8 mEq/L (ref 3.5–5.1)
Sodium: 139 mEq/L (ref 135–145)
Total Bilirubin: 0.5 mg/dL (ref 0.2–1.2)
Total Protein: 6.5 g/dL (ref 6.0–8.3)

## 2021-01-27 LAB — VITAMIN D 25 HYDROXY (VIT D DEFICIENCY, FRACTURES): VITD: 51.96 ng/mL (ref 30.00–100.00)

## 2021-01-27 LAB — TSH: TSH: 1.48 u[IU]/mL (ref 0.35–5.50)

## 2021-01-29 ENCOUNTER — Other Ambulatory Visit: Payer: Self-pay

## 2021-01-29 ENCOUNTER — Ambulatory Visit (INDEPENDENT_AMBULATORY_CARE_PROVIDER_SITE_OTHER): Payer: No Typology Code available for payment source | Admitting: Internal Medicine

## 2021-01-29 ENCOUNTER — Encounter: Payer: Self-pay | Admitting: Internal Medicine

## 2021-01-29 VITALS — BP 106/56 | HR 73 | Temp 96.8°F | Ht 64.0 in | Wt 130.4 lb

## 2021-01-29 DIAGNOSIS — Z780 Asymptomatic menopausal state: Secondary | ICD-10-CM | POA: Diagnosis not present

## 2021-01-29 DIAGNOSIS — F419 Anxiety disorder, unspecified: Secondary | ICD-10-CM | POA: Diagnosis not present

## 2021-01-29 DIAGNOSIS — F5105 Insomnia due to other mental disorder: Secondary | ICD-10-CM

## 2021-01-29 DIAGNOSIS — Z1211 Encounter for screening for malignant neoplasm of colon: Secondary | ICD-10-CM

## 2021-01-29 DIAGNOSIS — Z0000 Encounter for general adult medical examination without abnormal findings: Secondary | ICD-10-CM | POA: Diagnosis not present

## 2021-01-29 DIAGNOSIS — D500 Iron deficiency anemia secondary to blood loss (chronic): Secondary | ICD-10-CM | POA: Insufficient documentation

## 2021-01-29 MED ORDER — MELOXICAM 15 MG PO TABS: 15.0000 mg | ORAL_TABLET | Freq: Every day | ORAL | 1 refills | Status: DC

## 2021-01-29 MED ORDER — ALPRAZOLAM 0.25 MG PO TABS
0.2500 mg | ORAL_TABLET | Freq: Two times a day (BID) | ORAL | 0 refills | Status: AC | PRN
  Filled 2021-01-29: qty 30, 15d supply, fill #0

## 2021-01-29 NOTE — Patient Instructions (Addendum)
Your next colonoscopy is due April 2023 ,  will make referral now   Do not donate blood more than every 3 months'  Continue taking 5000 IUs of vitamin D3  daily for the winter months through April,  then reduce dose  to 5000 IUs every other day for spring and summer months  You are up to date on all vaccines except for pneumonia.  You can get the Prevnar 20  vaccine  at the Vaccine clinic pharmacy on Fridays

## 2021-01-29 NOTE — Assessment & Plan Note (Signed)
Managed with prn alprazolam . The risks and benefits of benzodiazepine use were reviewed  with patient today including excessive sedation leading to respiratory depression,  impaired thinking/driving, and addiction.  Patient was advised to avoid concurrent use with alcohol, to use medication only as needed and not to share with others  .  

## 2021-01-29 NOTE — Assessment & Plan Note (Signed)

## 2021-01-29 NOTE — Assessment & Plan Note (Addendum)
Advised to reduce frequency of blood donation to every 4 months or less often  To allow hgb to return to normal  Lab Results  Component Value Date   WBC 6.1 01/25/2020   HGB 13.5 01/25/2020   HCT 40.2 01/25/2020   MCV 92.0 01/25/2020   PLT 309.0 01/25/2020

## 2021-01-29 NOTE — Progress Notes (Signed)
Patient ID: Heather Watson, female    DOB: 05-07-1955  Age: 65 y.o. MRN: [id number]  The patient is here for annual  preventive  examination and management of other chronic and acute problems.   The risk factors are reflected in the social history.  The roster of all physicians providing medical care to patient - is listed in the Snapshot section of the chart.  Activities of daily living:  The patient is 100% independent in all ADLs: dressing, toileting, feeding as well as independent mobility  Home safety : The patient has smoke detectors in the home. They wear seatbelts.  There are no firearms at home. There is no violence in the home.   There is no risks for hepatitis, STDs or HIV. There is no   history of blood transfusion. They have no travel history to infectious disease endemic areas of the world.  The patient has seen their dentist in the last six month. They have seen their eye doctor in the last year. They admit to slight hearing difficulty with regard to whispered voices and some television programs.  They have deferred audiologic testing in the last year.  They do not  have excessive sun exposure. Discussed the need for sun protection: hats, long sleeves and use of sunscreen if there is significant sun exposure.   Diet: the importance of a healthy diet is discussed. They do have a healthy diet.  The benefits of regular aerobic exercise were discussed. She runs  4 times per week ,  60 minutes.   Depression screen: there are no signs or vegative symptoms of depression- irritability, change in appetite, anhedonia, sadness/tearfullness.  Cognitive assessment: the patient manages all their financial and personal affairs and is actively engaged. They could relate day,date,year and events; recalled 2/3 objects at 3 minutes; performed clock-face test normally.  The following portions of the patient's history were reviewed and updated as appropriate: allergies, current medications, past  family history, past medical history,  past surgical history, past social history  and problem list.  Visual acuity was not assessed per patient preference since she has regular follow up with her ophthalmologist. Hearing and body mass index were assessed and reviewed.   During the course of the visit the patient was educated and counseled about appropriate screening and preventive services including : fall prevention , diabetes screening, nutrition counseling, colorectal cancer screening, and recommended immunizations.    CC: The primary encounter diagnosis was Colon cancer screening. Diagnoses of Postmenopausal estrogen deficiency, Insomnia secondary to anxiety, Encounter for preventive health examination, and Anemia, blood loss were also pertinent to this visit.   1) anemia of acute blood loss:  she had been donating blood every 2 months,  recently her donation was deferred in Sept  bc hgb was 11.5  after August donation .  Last donation was early nov hgb 13.5   2) osteopenia:    taking almond milk and other dietary sources     History Akeera has a past medical history of Family history of breast cancer, Gallstones, and UTI (urinary tract infection).   She has a past surgical history that includes Colonoscopy with propofol (N/A, 05/19/2016).   Her family history includes Arthritis in her mother; Breast cancer (age of onset: 84) in her maternal aunt; Breast cancer (age of onset: 82) in her mother; Cancer in her mother, paternal uncle, and sister; Heart attack in her maternal grandfather; Hypertension in her brother, brother, father, mother, and sister; Stroke in her father and  paternal grandfather.She reports that she quit smoking about 39 years ago. Her smoking use included cigarettes. She has never used smokeless tobacco. She reports current alcohol use. She reports that she does not use drugs.  Outpatient Medications Prior to Visit  Medication Sig Dispense Refill   magnesium gluconate  (MAGONATE) 500 MG tablet Take 800 mg by mouth daily.     Omega-3 1000 MG CAPS Take 1 capsule by mouth daily.     vitamin B-12 (CYANOCOBALAMIN) 1000 MCG tablet Take 1,000 mcg by mouth daily.     Cholecalciferol (VITAMIN D) 2000 UNITS tablet Take 2,000 Units by mouth daily. Reported on 02/27/2015     meloxicam (MOBIC) 15 MG tablet Take 1 tablet (15 mg total) by mouth daily. 30 tablet 5   Biotin 5000 MCG TABS Take 1 tablet by mouth daily.     calcium gluconate 500 MG tablet Take 1 tablet by mouth daily.     COVID-19 mRNA Vac-TriS, Pfizer, (PFIZER-BIONT COVID-19 VAC-TRIS) SUSP injection Inject into the muscle. 0.3 mL 0   Ginger, Zingiber officinalis, (GINGER PO) Take 300 mg by mouth daily.     OIL OF OREGANO PO Take 50 mg by mouth daily.     No facility-administered medications prior to visit.    Review of Systems  Patient denies headache, fevers, malaise, unintentional weight loss, skin rash, eye pain, sinus congestion and sinus pain, sore throat, dysphagia,  hemoptysis , cough, dyspnea, wheezing, chest pain, palpitations, orthopnea, edema, abdominal pain, nausea, melena, diarrhea, constipation, flank pain, dysuria, hematuria, urinary  Frequency, nocturia, numbness, tingling, seizures,  Focal weakness, Loss of consciousness,  Tremor, insomnia, depression, anxiety, and suicidal ideation.     Objective:  BP (!) 106/56 (BP Location: Left Arm, Patient Position: Sitting, Cuff Size: Normal)    Pulse 73    Temp (!) 96.8 F (36 C) (Temporal)    Ht 5\' 4"  (1.626 m)    Wt 130 lb 6.4 oz (59.1 kg)    LMP 04/12/2006    SpO2 99%    BMI 22.38 kg/m   Physical Exam   General appearance: alert, cooperative and appears stated age Head: Normocephalic, without obvious abnormality, atraumatic Eyes: conjunctivae/corneas clear. PERRL, EOM's intact. Fundi benign. Ears: normal TM's and external ear canals both ears Nose: Nares normal. Septum midline. Mucosa normal. No drainage or sinus tenderness. Throat: lips,  mucosa, and tongue normal; teeth and gums normal Neck: no adenopathy, no carotid bruit, no JVD, supple, symmetrical, trachea midline and thyroid not enlarged, symmetric, no tenderness/mass/nodules Lungs: clear to auscultation bilaterally Breasts: normal appearance, no masses or tenderness Heart: regular rate and rhythm, S1, S2 normal, no murmur, click, rub or gallop Abdomen: soft, non-tender; bowel sounds normal; no masses,  no organomegaly Extremities: extremities normal, atraumatic, no cyanosis or edema Pulses: 2+ and symmetric Skin: Skin color, texture, turgor normal. No rashes or lesions Neurologic: Alert and oriented X 3, normal strength and tone. Normal symmetric reflexes. Normal coordination and gait.    Assessment & Plan:   Problem List Items Addressed This Visit     Insomnia secondary to anxiety    Managed with prn alprazolam .The risks and benefits of benzodiazepine use were reviewed with patient today including excessive sedation leading to respiratory depression,  impaired thinking/driving, and addiction.  Patient was advised to avoid concurrent use with alcohol, to use medication only as needed and not to share with others  .       Relevant Medications   ALPRAZolam (XANAX) 0.25 MG tablet  Encounter for preventive health examination    age appropriate education and counseling updated, referrals for preventative services and immunizations addressed, dietary and smoking counseling addressed, most recent labs reviewed.  I have personally reviewed and have noted:   1) the patient's medical and social history 2) The pt's use of alcohol, tobacco, and illicit drugs 3) The patient's current medications and supplements 4) Functional ability including ADL's, fall risk, home safety risk, hearing and visual impairment 5) Diet and physical activities 6) Evidence for depression or mood disorder 7) The patient's height, weight, and BMI have been recorded in the chart   I have made  referrals, and provided counseling and education based on review of the above      Colon cancer screening - Primary   Relevant Orders   Ambulatory referral to Gastroenterology   Anemia, blood loss    Advised to reduce frequency of blood donation to every 4 months or less often  To allow hgb to return to normal  Lab Results  Component Value Date   WBC 6.1 01/25/2020   HGB 13.5 01/25/2020   HCT 40.2 01/25/2020   MCV 92.0 01/25/2020   PLT 309.0 01/25/2020         Relevant Medications   vitamin B-12 (CYANOCOBALAMIN) 1000 MCG tablet   Other Visit Diagnoses     Postmenopausal estrogen deficiency       Relevant Orders   DG Bone Density       I have discontinued Calypso C. Eppes's Vitamin D, calcium gluconate, (Ginger, Zingiber officinalis, (GINGER PO)), Biotin, OIL OF OREGANO PO, and Pfizer-BioNT COVID-19 Vac-TriS. I am also having her start on ALPRAZolam. Additionally, I am having her maintain her magnesium gluconate, Omega-3, vitamin B-12, and meloxicam.  Meds ordered this encounter  Medications   meloxicam (MOBIC) 15 MG tablet    Sig: Take 1 tablet (15 mg total) by mouth daily.    Dispense:  90 tablet    Refill:  1   ALPRAZolam (XANAX) 0.25 MG tablet    Sig: Take 1 tablet (0.25 mg total) by mouth 2 (two) times daily as needed for anxiety.    Dispense:  30 tablet    Refill:  0    KEEP ON FILE FOR FUTURE REFILLS    Medications Discontinued During This Encounter  Medication Reason   Biotin 5000 MCG TABS    calcium gluconate 500 MG tablet    Ginger, Zingiber officinalis, (GINGER PO)    OIL OF OREGANO PO    COVID-19 mRNA Vac-TriS, Pfizer, (PFIZER-BIONT COVID-19 VAC-TRIS) SUSP injection    meloxicam (MOBIC) 15 MG tablet Reorder   Cholecalciferol (VITAMIN D) 2000 UNITS tablet     Follow-up: No follow-ups on file.   Sherlene Shams, MD

## 2021-01-30 NOTE — Progress Notes (Signed)
Patient will call back in Feb to schedule procedure for April.

## 2021-01-31 ENCOUNTER — Other Ambulatory Visit: Payer: Self-pay

## 2021-01-31 MED ORDER — PREVNAR 20 0.5 ML IM SUSY
PREFILLED_SYRINGE | INTRAMUSCULAR | 0 refills | Status: DC
  Filled 2021-01-31: qty 0.5, 1d supply, fill #0

## 2021-02-13 ENCOUNTER — Encounter: Payer: Self-pay | Admitting: Internal Medicine

## 2021-02-13 DIAGNOSIS — Z803 Family history of malignant neoplasm of breast: Secondary | ICD-10-CM

## 2021-02-18 ENCOUNTER — Ambulatory Visit
Admission: RE | Admit: 2021-02-18 | Discharge: 2021-02-18 | Disposition: A | Discharge status: Home / Self Care | Payer: No Typology Code available for payment source | Source: Ambulatory Visit | Attending: Internal Medicine | Admitting: Internal Medicine

## 2021-02-18 ENCOUNTER — Other Ambulatory Visit: Payer: Self-pay

## 2021-02-18 DIAGNOSIS — Z1231 Encounter for screening mammogram for malignant neoplasm of breast: Secondary | ICD-10-CM | POA: Diagnosis not present

## 2021-02-25 NOTE — Telephone Encounter (Signed)
I spoke with pt regarding the MRI breast. Pt spoke with ins and its ok to have done anytime. Pt will have a copay of 250.00 pt is ok with that. Pt wants to have MRI breast done in 6 month (July). Please place MRI breast order to be done in [place] [facility]. Please advise and Thank you!

## 2021-03-04 ENCOUNTER — Encounter: Payer: Self-pay | Admitting: Obstetrics and Gynecology

## 2021-03-04 ENCOUNTER — Other Ambulatory Visit: Payer: Self-pay

## 2021-03-04 ENCOUNTER — Ambulatory Visit (INDEPENDENT_AMBULATORY_CARE_PROVIDER_SITE_OTHER): Payer: No Typology Code available for payment source | Admitting: Obstetrics and Gynecology

## 2021-03-04 VITALS — BP 118/80 | HR 74 | Ht 65.0 in | Wt 128.0 lb

## 2021-03-04 DIAGNOSIS — N393 Stress incontinence (female) (male): Secondary | ICD-10-CM

## 2021-03-04 DIAGNOSIS — Z8371 Family history of colonic polyps: Secondary | ICD-10-CM | POA: Diagnosis not present

## 2021-03-04 DIAGNOSIS — Z7689 Persons encountering health services in other specified circumstances: Secondary | ICD-10-CM | POA: Diagnosis not present

## 2021-03-04 DIAGNOSIS — Z8619 Personal history of other infectious and parasitic diseases: Secondary | ICD-10-CM

## 2021-03-04 DIAGNOSIS — Z803 Family history of malignant neoplasm of breast: Secondary | ICD-10-CM | POA: Diagnosis not present

## 2021-03-04 DIAGNOSIS — Z01419 Encounter for gynecological examination (general) (routine) without abnormal findings: Secondary | ICD-10-CM | POA: Diagnosis not present

## 2021-03-04 DIAGNOSIS — N952 Postmenopausal atrophic vaginitis: Secondary | ICD-10-CM

## 2021-03-04 MED ORDER — ESTRADIOL 10 MCG VA TABS
ORAL_TABLET | VAGINAL | 6 refills | Status: DC
  Filled 2021-03-04: qty 18, 28d supply, fill #0

## 2021-03-04 NOTE — Progress Notes (Signed)
ANNUAL PREVENTATIVE CARE GYNECOLOGY  ENCOUNTER NOTE  Subjective:       Heather Watson is a 66 y.o. G63P3000 female here to establish care, and for a routine gynecologic exam.  Previously seen at [facility]. The patient is not currently sexually active (currently going through a divorce). The patient is not currently taking hormone replacement therapy (has utilized in the past). Patient denies post-menopausal vaginal bleeding. The patient wears seatbelts: yes. The patient participates in regular exercise: yes. Has the patient ever been transfused or tattooed?: no. The patient reports that there is not domestic violence in her life.  Current complaints: 1.  Has questions about HPV.  Had an abnormal pap smear ~ 3 years ago, followed by normal pap smear.  2. Has strong odor in urine in the mornings when waking.  Has been taking Tumeric, ginger, and vinegar supplements for some time.  Notes that she stopped them last week to see if this would improve her symptoms. Denies any UTI symptoms. Consumes plenty of water (10-12 cups per day). 3.  Stress urinary incontinence has been ongoing for ~5  years. Has tried Kegels and pelvic floor exercises in the past which did not help much.  4. Has issues with vaginal atrophy, was using Premarin cream.  Desires to discuss other options.    Gynecologic History Patient's last menstrual period was 04/12/2006. Contraception: post menopausal status Last Pap: 01/26/2019. Results were: normal. H/o abnormal pap smear in 2019, ASCUS HR HPV+.  Last mammogram: 02/18/2021. Results were: normal, but dense breast tissue. Last Colonoscopy: 05/19/2016. Results were: normal.  Repeat in 5 years due to family history.  Last Dexa Scan: 01/24/2013.  Results were: T score -1.4. Osteopenia.    Obstetric History OB History  Gravida Para Term Preterm AB Living  3 3 3         SAB IAB Ectopic Multiple Live Births               # Outcome Date GA Lbr Len/2nd Weight Sex Delivery  Anes PTL Lv  3 Term           2 Term           1 Term             Past Medical History:  Diagnosis Date   Family history of breast cancer    Gallstones    UTI (urinary tract infection)     Family History  Problem Relation Age of Onset   Hypertension Mother    Arthritis Mother        osteoarthritis   Breast cancer Mother 7   Cancer Mother        stomach/intestine   Stroke Father    Hypertension Father    Cancer Sister        lymphoma   Hypertension Sister    Hypertension Brother    Heart attack Maternal Grandfather    Stroke Paternal Grandfather    Hypertension Brother    Breast cancer Maternal Aunt 71   Cancer Paternal Uncle        unk type    Past Surgical History:  Procedure Laterality Date   COLONOSCOPY WITH PROPOFOL N/A 05/19/2016   Procedure: COLONOSCOPY WITH PROPOFOL;  Surgeon: Lucilla Lame, MD;  Location: [facility] ENDOSCOPY;  Service: Endoscopy;  Laterality: N/A;    Social History   Socioeconomic History   Marital status: Married    Spouse name: Not on file   Number of children: Not on file  Years of education: Not on file   Highest education level: Not on file  Occupational History   Not on file  Tobacco Use   Smoking status: Former    Types: Cigarettes    Quit date: 08/21/1981    Years since quitting: 39.5   Smokeless tobacco: Never  Substance and Sexual Activity   Alcohol use: Yes   Drug use: No   Sexual activity: Yes    Birth control/protection: Post-menopausal  Other Topics Concern   Not on file  Social History Narrative   Not on file   Social Determinants of Health   Financial Resource Strain: Not on file  Food Insecurity: Not on file  Transportation Needs: Not on file  Physical Activity: Not on file  Stress: Not on file  Social Connections: Not on file  Intimate Partner Violence: Not on file    Current Outpatient Medications on File Prior to Visit  Medication Sig Dispense Refill   ALPRAZolam (XANAX) 0.25 MG tablet Take 1 tablet  (0.25 mg total) by mouth 2 (two) times daily as needed for anxiety. 30 tablet 0   magnesium gluconate (MAGONATE) 500 MG tablet Take 800 mg by mouth daily.     meloxicam (MOBIC) 15 MG tablet Take 1 tablet (15 mg total) by mouth daily. 90 tablet 1   Omega-3 1000 MG CAPS Take 1 capsule by mouth daily.     pneumococcal 20-valent conjugate vaccine (PREVNAR 20) 0.5 ML injection Inject into the muscle. 0.5 mL 0   vitamin B-12 (CYANOCOBALAMIN) 1000 MCG tablet Take 1,000 mcg by mouth daily.     No current facility-administered medications on file prior to visit.    Allergies  Allergen Reactions   Iodine Rash and Itching    Contrast through IV   Soy Allergy       Review of Systems ROS Review of Systems - General ROS: negative for - chills, fatigue, fever, hot flashes, night sweats, weight gain or weight loss Psychological ROS: negative for - anxiety, decreased libido, depression, mood swings, physical abuse or sexual abuse Ophthalmic ROS: negative for - blurry vision, eye pain or loss of vision ENT ROS: negative for - headaches, hearing change, visual changes or vocal changes Allergy and Immunology ROS: negative for - hives, itchy/watery eyes or seasonal allergies Hematological and Lymphatic ROS: negative for - bleeding problems, bruising, swollen lymph nodes or weight loss Endocrine ROS: negative for - galactorrhea, hair pattern changes, hot flashes, malaise/lethargy, mood swings, palpitations, polydipsia/polyuria, skin changes, temperature intolerance or unexpected weight changes Breast ROS: negative for - new or changing breast lumps or nipple discharge Respiratory ROS: negative for - cough or shortness of breath Cardiovascular ROS: negative for - chest pain, irregular heartbeat, palpitations or shortness of breath Gastrointestinal ROS: no abdominal pain, change in bowel habits, or black or bloody stools Genito-Urinary ROS: no dysuria, trouble voiding, or hematuria. Positive for urinary  odor, incontinence, and vaginal dryness (see HPI) Musculoskeletal ROS: negative for - joint pain or joint stiffness Neurological ROS: negative for - bowel and bladder control changes Dermatological ROS: negative for rash and skin lesion changes   Objective:   BP 118/80    Pulse 74    Ht 5\' 5"  (1.651 m)    Wt 128 lb (58.1 kg)    LMP 04/12/2006    SpO2 99%    BMI 21.30 kg/m  CONSTITUTIONAL: Well-developed, well-nourished female in no acute distress.  PSYCHIATRIC: Normal mood and affect. Normal behavior. Normal judgment and thought content. [place]: Alert  and oriented to person, place, and time. Normal muscle tone coordination. No cranial nerve deficit noted. HENT:  Normocephalic, atraumatic, External right and left ear normal. Oropharynx is clear and moist EYES: Conjunctivae and EOM are normal. Pupils are equal, round, and reactive to light. No scleral icterus.  NECK: Normal range of motion, supple, no masses.  Normal thyroid.  SKIN: Skin is warm and dry. No rash noted. Not diaphoretic. No erythema. No pallor. CARDIOVASCULAR: Normal heart rate noted, regular rhythm, no murmur. RESPIRATORY: Clear to auscultation bilaterally. Effort and breath sounds normal, no problems with respiration noted. BREASTS: Symmetric in size. No masses, skin changes, nipple drainage, or lymphadenopathy. Fibrous tissue noted bilaterally, R>L  ABDOMEN: Soft, normal bowel sounds, no distention noted.  No tenderness, rebound or guarding.  BLADDER: Normal PELVIC:  Bladder no bladder distension noted  Urethra: normal appearing urethra with no masses, tenderness or lesions  Vulva: normal appearing vulva with no masses, tenderness or lesions  Vagina: atrophic (moderate) with Grade 1 cystocele and rectocele. No lesions.   Cervix: normal appearing cervix without discharge or lesions  Uterus: uterus is normal size, shape, consistency and nontender  Adnexa: normal adnexa in size, nontender and no masses  RV: External Exam  NormaI, No Rectal Masses, and Normal Sphincter tone  MUSCULOSKELETAL: Normal range of motion. No tenderness.  No cyanosis, clubbing, or edema.  2+ distal pulses. LYMPHATIC: No Axillary, Supraclavicular, or Inguinal Adenopathy.   Labs: Lab Results  Component Value Date   WBC 6.1 01/25/2020   HGB 13.5 01/25/2020   HCT 40.2 01/25/2020   MCV 92.0 01/25/2020   PLT 309.0 01/25/2020    Lab Results  Component Value Date   CREATININE 0.57 01/27/2021   BUN 18 01/27/2021   NA 139 01/27/2021   K 3.8 01/27/2021   CL 103 01/27/2021   CO2 29 01/27/2021    Lab Results  Component Value Date   ALT 16 01/27/2021   AST 19 01/27/2021   ALKPHOS 76 01/27/2021   BILITOT 0.5 01/27/2021    Lab Results  Component Value Date   CHOL 166 01/27/2021   HDL 78.80 01/27/2021   LDLCALC 76 01/27/2021   LDLDIRECT 74.0 01/20/2019   TRIG 54.0 01/27/2021   CHOLHDL 2 01/27/2021    Lab Results  Component Value Date   TSH 1.48 01/27/2021    Lab Results  Component Value Date   HGBA1C 5.2 01/25/2020     Assessment:   1. Encounter to establish care   2. Encounter for gynecological examination   3. Family history of colonic polyps   4. Family history of breast cancer   5. History of HPV infection   6. Vaginal atrophy   7. Stress incontinence in female     Plan:   Encounter to establish care - Established care with new provider today. Previously seen at [facility] in 2019.  - Continue routine care with PCP as well.  - Labs: None ordered. Has labs by PCP.  - Routine preventative health maintenance measures emphasized: Exercise/Diet/Weight control, Tobacco Warnings, Alcohol/Substance use risks, Stress Management, Peer Pressure Issues, and Safe Sex - COVID Vaccination status: has received 2 dose series as well as 2 boosters.  - Flu vaccine: up to date.  - Return to Clinic - 1 Year  Family history of colonic polyps - Colon screening:  Due in April. Continue screening q 5 years due to  family history of colon polyps  Family history of breast cancer - Mammogram: Up to date. Continue  routine screenings.  - Notes that she has requested for her insurance to cover an MRI due to her dense breast tissue.   History of HPV infection - Pap: performed today. If normal can discontinue screenings.   Vaginal atrophy - Previously using Premarin cream, but notes that it was too messy. Discussed different treatment options, including other estrogen-containing preparations (pill, ring), and Intrarosa (testosterone option).  Patient also notes that she was using olive oil for lubrication with intercourse in the past. Patient ok to try estrogen tablets. Will prescribe Vagifem.   Stress incontinence in female - Minimal improvement noted with Kegel exercises and physical therapy. Discussed other options for stress incontinence, including use of Poise Impressa vaginal inserts, pessary, or surgical intervention.  Patient opts to try the vaginal inserts for now.  To follow up if she opts for alternative method.   Osteopenia - Encouraged Vitamin D and calcium supplementation.  Is due for repeat Dexa scan this year    Rubie Maid, MD [facility]

## 2021-03-04 NOTE — Patient Instructions (Addendum)
After Visit Instructions:   For vaginal dryness I have prescribed vaginal estrogen tablets. Use 1 tablet daily for 2 weeks, then decrease to 1 tablet twice per week.  For urinary leakage, you are able to try the Poise Impressa vaginal inserts.  If these are not helpful, we can consider use of the pessary.  For urinary odor, I would suggest consuming cranberry juice for the next several days. Continue to avoid supplements/additives to your water at this time.

## 2021-03-05 ENCOUNTER — Other Ambulatory Visit (HOSPITAL_COMMUNITY)
Admission: RE | Admit: 2021-03-05 | Discharge: 2021-03-05 | Disposition: A | Discharge status: Home / Self Care | Payer: No Typology Code available for payment source | Source: Ambulatory Visit | Attending: Obstetrics and Gynecology | Admitting: Obstetrics and Gynecology

## 2021-03-05 ENCOUNTER — Other Ambulatory Visit: Payer: Self-pay

## 2021-03-05 ENCOUNTER — Other Ambulatory Visit: Payer: Self-pay | Admitting: *Deleted

## 2021-03-05 DIAGNOSIS — Z01419 Encounter for gynecological examination (general) (routine) without abnormal findings: Secondary | ICD-10-CM

## 2021-03-10 ENCOUNTER — Encounter: Payer: Self-pay | Admitting: Obstetrics and Gynecology

## 2021-03-10 LAB — CYTOLOGY - PAP
Diagnosis: NEGATIVE
Diagnosis: REACTIVE

## 2021-03-26 ENCOUNTER — Ambulatory Visit
Admission: RE | Admit: 2021-03-26 | Discharge: 2021-03-26 | Disposition: A | Discharge status: Home / Self Care | Payer: No Typology Code available for payment source | Source: Ambulatory Visit | Attending: Internal Medicine | Admitting: Internal Medicine

## 2021-03-26 ENCOUNTER — Other Ambulatory Visit: Payer: Self-pay

## 2021-03-26 DIAGNOSIS — Z780 Asymptomatic menopausal state: Secondary | ICD-10-CM | POA: Insufficient documentation

## 2021-05-26 ENCOUNTER — Encounter: Payer: Self-pay | Admitting: Ophthalmology

## 2021-05-27 ENCOUNTER — Telehealth: Payer: Self-pay | Admitting: Gastroenterology

## 2021-05-27 ENCOUNTER — Other Ambulatory Visit: Payer: Self-pay | Admitting: Gastroenterology

## 2021-05-27 DIAGNOSIS — Z8371 Family history of colonic polyps: Secondary | ICD-10-CM

## 2021-05-27 NOTE — Telephone Encounter (Signed)
Patient left vm requesting Melanie to call her back. Patients states she wants to reschedule her colonoscopy for 07/03/2021. Patients scheduled colonoscopy date is 07/08/2021. Patient requesting call back.  ?

## 2021-05-28 ENCOUNTER — Telehealth: Payer: Self-pay

## 2021-05-28 NOTE — Telephone Encounter (Signed)
Phone call returned to patient.  She said she has spoken with Wannetta Sender already to R/S her colonoscopy to 07/08/21.  Confirmed with Wannetta Sender and she is on the schedule for 23rd with Dr. Allen Norris. ? ?Thanks, ?Sharyn Lull, CMA ?

## 2021-06-03 ENCOUNTER — Other Ambulatory Visit: Payer: Self-pay

## 2021-06-04 NOTE — Telephone Encounter (Signed)
Pt leaves VM, would like prep called into [facility] pharmacy ?

## 2021-06-04 NOTE — Discharge Instructions (Signed)

## 2021-06-05 ENCOUNTER — Other Ambulatory Visit: Payer: Self-pay

## 2021-06-05 DIAGNOSIS — Z8371 Family history of colonic polyps: Secondary | ICD-10-CM

## 2021-06-05 MED ORDER — NA SULFATE-K SULFATE-MG SULF 17.5-3.13-1.6 GM/177ML PO SOLN
1.0000 | Freq: Once | ORAL | 0 refills | Status: AC
  Filled 2021-06-05: qty 354, 1d supply, fill #0

## 2021-06-06 ENCOUNTER — Ambulatory Visit
Admission: RE | Admit: 2021-06-06 | Discharge: 2021-06-06 | Disposition: A | Discharge status: Home / Self Care | Payer: No Typology Code available for payment source | Attending: Ophthalmology | Admitting: Ophthalmology

## 2021-06-06 ENCOUNTER — Encounter: Payer: Self-pay | Admitting: Ophthalmology

## 2021-06-06 ENCOUNTER — Ambulatory Visit: Payer: No Typology Code available for payment source | Admitting: Anesthesiology

## 2021-06-06 ENCOUNTER — Encounter
Admission: RE | Disposition: A | Discharge status: Home / Self Care | Payer: Self-pay | Source: Home / Self Care | Attending: Ophthalmology

## 2021-06-06 ENCOUNTER — Other Ambulatory Visit: Payer: Self-pay

## 2021-06-06 DIAGNOSIS — H02831 Dermatochalasis of right upper eyelid: Secondary | ICD-10-CM | POA: Diagnosis not present

## 2021-06-06 DIAGNOSIS — H02403 Unspecified ptosis of bilateral eyelids: Secondary | ICD-10-CM | POA: Insufficient documentation

## 2021-06-06 DIAGNOSIS — H02834 Dermatochalasis of left upper eyelid: Secondary | ICD-10-CM | POA: Diagnosis not present

## 2021-06-06 DIAGNOSIS — Z87891 Personal history of nicotine dependence: Secondary | ICD-10-CM | POA: Diagnosis not present

## 2021-06-06 HISTORY — DX: Unspecified osteoarthritis, unspecified site: M19.90

## 2021-06-06 HISTORY — PX: BROW LIFT: SHX178

## 2021-06-06 SURGERY — BLEPHAROPLASTY
Anesthesia: General | Laterality: Bilateral

## 2021-06-06 MED ORDER — BSS IO SOLN
INTRAOCULAR | Status: DC | PRN
  Administered 2021-06-06: 15 mL via INTRAOCULAR

## 2021-06-06 MED ORDER — TRAMADOL HCL 50 MG PO TABS: ORAL_TABLET | ORAL | 0 refills | Status: DC

## 2021-06-06 MED ORDER — ERYTHROMYCIN 5 MG/GM OP OINT
TOPICAL_OINTMENT | OPHTHALMIC | 2 refills | Status: DC
  Filled 2021-06-06: qty 3.5, 5d supply, fill #0
  Filled 2021-06-12: qty 3.5, 5d supply, fill #1
  Filled 2021-06-30: qty 3.5, 5d supply, fill #2

## 2021-06-06 MED ORDER — MIDAZOLAM HCL 2 MG/2ML IJ SOLN
INTRAMUSCULAR | Status: DC | PRN
  Administered 2021-06-06: 2 mg via INTRAVENOUS

## 2021-06-06 MED ORDER — PROPOFOL 10 MG/ML IV BOLUS
INTRAVENOUS | Status: DC | PRN
  Administered 2021-06-06: 90 mg via INTRAVENOUS

## 2021-06-06 MED ORDER — PROPOFOL 500 MG/50ML IV EMUL
INTRAVENOUS | Status: DC | PRN
  Administered 2021-06-06: 25 ug/kg/min via INTRAVENOUS

## 2021-06-06 MED ORDER — LACTATED RINGERS IV SOLN: INTRAVENOUS | Status: DC

## 2021-06-06 MED ORDER — LIDOCAINE-EPINEPHRINE 2 %-1:100000 IJ SOLN
INTRAMUSCULAR | Status: DC | PRN
  Administered 2021-06-06: 1 mL via OPHTHALMIC
  Administered 2021-06-06: .3 mL via OPHTHALMIC

## 2021-06-06 MED ORDER — TETRACAINE HCL 0.5 % OP SOLN
OPHTHALMIC | Status: DC | PRN
  Administered 2021-06-06: 1 [drp] via OPHTHALMIC

## 2021-06-06 MED ORDER — FENTANYL CITRATE (PF) 100 MCG/2ML IJ SOLN
INTRAMUSCULAR | Status: DC | PRN
  Administered 2021-06-06: 50 ug via INTRAVENOUS

## 2021-06-06 SURGICAL SUPPLY — 36 items
APPLICATOR COTTON TIP WD 3 STR (MISCELLANEOUS) ×2 IMPLANT
BLADE SURG 15 STRL LF DISP TIS (BLADE) ×1 IMPLANT
BLADE SURG 15 STRL SS (BLADE) ×2
CORD BIP STRL DISP 12FT (MISCELLANEOUS) ×2 IMPLANT
GAUZE SPONGE 4X4 12PLY STRL (GAUZE/BANDAGES/DRESSINGS) ×2 IMPLANT
GLOVE SURG UNDER POLY LF SZ7 (GLOVE) ×4 IMPLANT
GOWN STRL REUS W/ TWL LRG LVL3 (GOWN DISPOSABLE) ×1 IMPLANT
GOWN STRL REUS W/TWL LRG LVL3 (GOWN DISPOSABLE) ×2
MARKER SKIN XFINE TIP W/RULER (MISCELLANEOUS) ×2 IMPLANT
NDL FILTER BLUNT 18X1 1/2 (NEEDLE) ×1 IMPLANT
NDL HYPO 30X.5 LL (NEEDLE) ×2 IMPLANT
NEEDLE FILTER BLUNT 18X 1/2SAF (NEEDLE) ×1
NEEDLE FILTER BLUNT 18X1 1/2 (NEEDLE) ×1 IMPLANT
NEEDLE HYPO 30X.5 LL (NEEDLE) ×4 IMPLANT
PACK ENT CUSTOM (PACKS) ×2 IMPLANT
SOL PREP PVP 2OZ (MISCELLANEOUS) ×2
SOLUTION PREP PVP 2OZ (MISCELLANEOUS) ×1 IMPLANT
SPONGE GAUZE 2X2 8PLY STRL LF (GAUZE/BANDAGES/DRESSINGS) ×20 IMPLANT
SUT CHROMIC 4-0 (SUTURE)
SUT CHROMIC 4-0 M2 12X2 ARM (SUTURE)
SUT CHROMIC 5 0 P 3 (SUTURE) ×1 IMPLANT
SUT ETHILON 4 0 CL P 3 (SUTURE) IMPLANT
SUT GUT PLAIN 6-0 1X18 ABS (SUTURE) ×2 IMPLANT
SUT MERSILENE 4-0 S-2 (SUTURE) IMPLANT
SUT PROLENE 5 0 P 3 (SUTURE) IMPLANT
SUT PROLENE 6 0 P 1 18 (SUTURE) ×2 IMPLANT
SUT SILK 4 0 G 3 (SUTURE) IMPLANT
SUT VIC AB 5-0 P-3 18X BRD (SUTURE) IMPLANT
SUT VIC AB 5-0 P3 18 (SUTURE)
SUT VICRYL 6-0  S14 CTD (SUTURE)
SUT VICRYL 6-0 S14 CTD (SUTURE) IMPLANT
SUT VICRYL 7 0 TG140 8 (SUTURE) IMPLANT
SUTURE CHRMC 4-0 M2 12X2 ARM (SUTURE) IMPLANT
SYR 10ML LL (SYRINGE) ×2 IMPLANT
SYR 3ML LL SCALE MARK (SYRINGE) ×2 IMPLANT
WATER STERILE IRR 250ML POUR (IV SOLUTION) ×2 IMPLANT

## 2021-06-06 NOTE — Anesthesia Postprocedure Evaluation (Signed)
Anesthesia Post Note ? ?Patient: ZAKIYYAH LIVERGOOD ? ?Procedure(s) Performed: BLEPHAROPLASTY UPPER EYELID; W/ EXCESS SKIN BLEPHAROPTOSIS REPAIR; RESECT EX BILATERAL (Bilateral) ? ? ?  ?Patient location during evaluation: PACU ?Anesthesia Type: General ?Level of consciousness: awake and alert ?Pain management: pain level controlled ?Vital Signs Assessment: post-procedure vital signs reviewed and stable ?Respiratory status: spontaneous breathing, nonlabored ventilation, respiratory function stable and patient connected to nasal cannula oxygen ?Cardiovascular status: blood pressure returned to baseline and stable ?Postop Assessment: no apparent nausea or vomiting ?Anesthetic complications: no ? ? ?No notable events documented. ? ?Adele Barthel Mitul Hallowell ? ? ? ? ? ?

## 2021-06-06 NOTE — Interval H&P Note (Signed)
History and Physical Interval Note: ? ?06/06/2021 ?7:46 AM ? 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  The various methods of treatment have been discussed with the patient and family. After consideration of risks, benefits and other options for treatment, the patient has consented to  Procedure(s) with comments: ?BLEPHAROPLASTY UPPER EYELID; W/ EXCESS SKIN BLEPHAROPTOSIS REPAIR; RESECT EX BILATERAL (Bilateral) - wants early arrival as a surgical intervention.  The patient's history has been reviewed, patient examined, no change in status, stable for surgery.  I have reviewed the patient's chart and labs.  Questions were answered to the patient's satisfaction.   ? ? ?Ether Griffins Bereket Gernert M ? ? ?

## 2021-06-06 NOTE — H&P (Signed)
?[place]: [employer] ? ?Primary Care Physician:  Crecencio Mc, MD ?Ophthalmologist: Dr. Philis Pique. Vickki Muff, M.D. ? ?Pre-Procedure History & Physical: ?HPI:  Heather Watson is a 66 y.o. female here for periocular surgery. ?  ?Past Medical History:  ?Diagnosis Date  ? Arthritis   ? Right knee  ? Family history of breast cancer   ? Gallstones   ? UTI (urinary tract infection)   ? ? ?Past Surgical History:  ?Procedure Laterality Date  ? COLONOSCOPY WITH PROPOFOL N/A 05/19/2016  ? Procedure: COLONOSCOPY WITH PROPOFOL;  Surgeon: Lucilla Lame, MD;  Location: [facility] ENDOSCOPY;  Service: Endoscopy;  Laterality: N/A;  ? ? ?Prior to Admission medications   ?Medication Sig Start Date End Date Taking? Authorizing Provider  ?ALPRAZolam (XANAX) 0.25 MG tablet Take 1 tablet (0.25 mg total) by mouth 2 (two) times daily as needed for anxiety. ?Patient taking differently: Take 0.25 mg by mouth 2 (two) times daily as needed for anxiety. (Rare) 01/29/21  Yes Crecencio Mc, MD  ?Ascorbic Acid (VITAMIN C PO) Take by mouth daily.   Yes [provider]  ?Boswellia-Glucosamine-Vit D (OSTEO BI-FLEX ONE PER DAY PO) Take by mouth daily.   Yes [provider]  ?CALCIUM PO Take by mouth daily.   Yes [provider]  ?Cholecalciferol (VITAMIN D-3) 125 MCG (5000 UT) TABS Take by mouth daily.   Yes [provider]  ?Cranberry Extract 250 MG TABS Take 500 mg by mouth daily.   Yes [provider]  ?ELDERBERRY PO Take by mouth daily.   Yes [provider]  ?Estradiol 10 MCG TABS vaginal tablet Use 1 tablet daily for 2 weeks, then decrease to 1 tablet twice a week. 03/04/21  Yes Rubie Maid, MD  ?Ginger, Zingiber officinalis, (GINGER PO) Take by mouth daily.   Yes [provider]  ?magnesium gluconate (MAGONATE) 500 MG tablet Take 800 mg by mouth daily.   Yes [provider]  ?meloxicam (MOBIC) 15 MG tablet Take 1 tablet (15 mg total) by mouth daily. ?Patient taking  differently: Take 15 mg by mouth daily. Takes prn (rare) 01/29/21  Yes Crecencio Mc, MD  ?Multiple Vitamins-Minerals (ZINC PO) Take by mouth daily.   Yes [provider]  ?Na Sulfate-K Sulfate-Mg Sulf 17.5-3.13-1.6 GM/177ML SOLN Take 1 kit by mouth once for 1 dose. 06/05/21 06/06/21 Yes Lucilla Lame, MD  ?Probiotic Product (PROBIOTIC DAILY PO) Take by mouth daily.   Yes [provider]  ?Theanine 50 MG TBDP Take 100 mg by mouth.   Yes [provider]  ?vitamin B-12 (CYANOCOBALAMIN) 1000 MCG tablet Take 1,000 mcg by mouth daily.   Yes [provider]  ?Omega-3 1000 MG CAPS Take 1 capsule by mouth daily.    [provider]  ?pneumococcal 20-valent conjugate vaccine (PREVNAR 20) 0.5 ML injection Inject into the muscle. 01/31/21   Carlyle Basques, MD  ? ? ?Allergies as of 04/10/2021 - Review Complete 03/04/2021  ?Allergen Reaction Noted  ? Iodine Rash and Itching 05/12/2012  ? Soy allergy  07/14/2018  ? ? ?Family History  ?Problem Relation Age of Onset  ? Hypertension Mother   ? Arthritis Mother   ?     osteoarthritis  ? Breast cancer Mother 37  ? Cancer Mother   ?     stomach/intestine  ? Stroke Father   ? Hypertension Father   ? Cancer Sister   ?     lymphoma  ? Hypertension Sister   ?  Hypertension Brother   ? Heart attack Maternal Grandfather   ? Stroke Paternal Grandfather   ? Hypertension Brother   ? Breast cancer Maternal Aunt 73  ? Cancer Paternal Uncle   ?     unk type  ? ? ?Social History  ? ?Socioeconomic History  ? Marital status: Married  ?  Spouse name: Not on file  ? Number of children: Not on file  ? Years of education: Not on file  ? Highest education level: Not on file  ?Occupational History  ? Not on file  ?Tobacco Use  ? Smoking status: Former  ?  Types: Cigarettes  ?  Quit date: 08/21/1981  ?  Years since quitting: 39.8  ? Smokeless tobacco: Never  ?Vaping Use  ? Vaping Use: Never used  ?Substance and Sexual Activity  ? Alcohol use: Yes  ?  Comment:  occasional  ? Drug use: No  ? Sexual activity: Yes  ?  Birth control/protection: Post-menopausal  ?Other Topics Concern  ? Not on file  ?Social History Narrative  ? Not on file  ? ?Social Determinants of Health  ? ?Financial Resource Strain: Not on file  ?Food Insecurity: Not on file  ?Transportation Needs: Not on file  ?Physical Activity: Not on file  ?Stress: Not on file  ?Social Connections: Not on file  ?Intimate Partner Violence: Not on file  ? ? ?Review of Systems: ?See HPI, otherwise negative ROS ? ?Physical Exam: ?BP 126/85   Pulse 74   Temp 98.1 ?F (36.7 ?C) (Temporal)   Resp 20   Ht _0  (1.626 m)   Wt 57.2 kg   LMP 04/12/2006   SpO2 100%   BMI 21.63 kg/m?  ?General:   Alert and cooperative in NAD ?Head:  Normocephalic and atraumatic. ?Respiratory:  Normal work of breathing. ? ?Impression/Plan: ?Heather Watson is here for periocular surgery. ? ?Risks, benefits, limitations, and alternatives regarding surgery have been reviewed with the patient.  Questions have been answered.  All parties agreeable. ? ? ?Karle Starch, MD  06/06/2021, 7:46 AM  ?

## 2021-06-06 NOTE — Anesthesia Procedure Notes (Signed)
Date/Time: 06/06/2021 8:21 AM ?Performed by: Maree Krabbe, CRNA ?Pre-anesthesia Checklist: Patient identified, Emergency Drugs available, Suction available, Timeout performed and Patient being monitored ?Patient Re-evaluated:Patient Re-evaluated prior to induction ?Oxygen Delivery Method: Nasal cannula ?Placement Confirmation: positive ETCO2 ? ? ? ? ?

## 2021-06-06 NOTE — Op Note (Signed)
Preoperative Diagnosis: ? ?1. Visually significant blepharoptosis bilateral  Upper Eyelid(s) ?2. Visually significant dermatochalasis bilateral  Upper Eyelid(s) ? ?Postoperative Diagnosis: ? ?Same. ? ?Procedure(s) Performed:  ? ?1. Blepharoptosis repair with levator aponeurosis advancement bilateral  Upper Eyelid(s) ?2. Upper eyelid blepharoplasty with excess skin excision  bilateral  Upper Eyelid(s) ? ?Surgeon: Philis Pique. Vickki Muff, M.D. ? ?Assistants: none ? ?Anesthesia: MAC ? ?Specimens: None. ? ?Estimated Blood Loss: Minimal. ? ?Complications: None. ? ?Operative Findings: None Dictated ? ?Procedure:  ? ?Allergies were reviewed and the patient Iodine and Soy allergy.  ? ?After the risks, benefits, complications and alternatives were discussed with the patient, appropriate informed consent was obtained.  While seated in an upright position and looking in primary gaze, the mid pupillary line was marked on the upper eyelid margins bilaterally. The patient was then brought to the operating suite and reclined supine.  Timeout was conducted and the patient was sedated.  Local anesthetic consisting of a 50-50 mixture of 2% lidocaine with epinephrine and 0.75% bupivacaine with added Hylenex was injected subcutaneously to both  upper eyelid(s). After adequate local was instilled, the patient was prepped and draped in the usual sterile fashion for eyelid surgery.  ? ?Attention was turned to the upper eyelids. A 23m upper eyelid crease incision line was marked with calipers on both  upper eyelid(s).  A pinch test was used to estimate the amount of excess skin to remove and this was marked in standard blepharoplasty style fashion. Attention was turned to the  right  upper eyelid. A #15 blade was used to open the premarked incision line. A Skin and partial muscle flap was excised and hemostasis was obtained with bipolar cautery.  ? ?Westcott scissors were then used to transect through orbicularis down to the tarsal plate. Epitarsus  was dissected to create a smooth surface to suture to. Dissection was then carried superiorly in the plane between orbicularis and orbital septum. Once the preaponeurotic fat pocket was identified, the orbital septum was opened. This revealed the levator and its aponeurosis.   ? ?Attention was then turned to the opposite eyelid where the same procedure was performed in the same manner. Hemostasis was obtained with bipolar cautery throughout.  ? ?3 interrupted 6-0 Prolene sutures were then passed partial thickness through the tarsal plates of both  upper eyelid(s). These sutures were placed in line with the mid pupillary, medial limbal, and lateral limbal lines. The sutures were fixed to the levator aponeurosis and adjusted until a nice lid height and contour were achieved. Once nice symmetry was achieved, the skin incisions were closed with a running 6-0 plain gut suture. The patient tolerated the procedure well.  Erythromycin ophthalmic ophthalmic ointment was applied to the incision site(s) followed by ice packs. The patient was taken to the recovery area where she recovered without difficulty. ? ?Post-Op Plan/Instructions:  ? ?The patient was instructed to use ice packs frequently for the next 48 hours. She was instructed to use Erythromycin ophthalmic ophthalmic ointment on her incisions 4 times a day for the next 12 to 14 days. Shewas given a prescription for tramadol (or similar) for pain control should Tylenol not be effective. [facility] [place] or sooner as needed for problems. ? ?Bader Stubblefield M. FVickki Muff M.D. ?Ophthalmology ? ? ?

## 2021-06-06 NOTE — Transfer of Care (Signed)
Immediate Anesthesia Transfer of Care Note ? ?Patient: Heather Watson ? ?Procedure(s) Performed: BLEPHAROPLASTY UPPER EYELID; W/ EXCESS SKIN BLEPHAROPTOSIS REPAIR; RESECT EX BILATERAL (Bilateral) ? ?Patient Location: PACU ? ?Anesthesia Type: General ? ?Level of Consciousness: awake, alert  and patient cooperative ? ?Airway and Oxygen Therapy: Patient Spontanous Breathing and Patient connected to supplemental oxygen ? ?Post-op Assessment: Post-op Vital signs reviewed, Patient's Cardiovascular Status Stable, Respiratory Function Stable, Patent Airway and No signs of Nausea or vomiting ? ?Post-op Vital Signs: Reviewed and stable ? ?Complications: No notable events documented. ? ?

## 2021-06-06 NOTE — Anesthesia Preprocedure Evaluation (Signed)
Anesthesia Evaluation  ?Patient identified by MRN, date of birth, ID band ?Patient awake ? ? ? ?History of Anesthesia Complications ?Negative for: history of anesthetic complications ? ?Airway ?Mallampati: III ? ?TM Distance: >3 FB ?Neck ROM: Full ? ? ? Dental ?no notable dental hx. ? ?  ?Pulmonary ?neg pulmonary ROS, former smoker,  ?  ?Pulmonary exam normal ? ? ? ? ? ? ? Cardiovascular ?Exercise Tolerance: Good ?negative cardio ROS ?Normal cardiovascular exam ? ? ?  ?Neuro/Psych ?  ? GI/Hepatic ?negative GI ROS, Neg liver ROS,   ?Endo/Other  ?negative endocrine ROS ? Renal/GU ?negative Renal ROS  ? ?  ?Musculoskeletal ? ? Abdominal ?  ?Peds ? Hematology ?  ?Anesthesia Other Findings ? ? Reproductive/Obstetrics ? ?  ? ? ? ? ? ? ? ? ? ? ? ? ? ?  ?  ? ? ? ? ? ? ?Anesthesia Physical ?Anesthesia Plan ? ?ASA: 1 ? ?Anesthesia Plan: General  ? ?Post-op Pain Management: Minimal or no pain anticipated  ? ?Induction:  ? ?PONV Risk Score and Plan: 3 and Propofol infusion, TIVA, Midazolam, Treatment may vary due to age or medical condition and Ondansetron ? ?Airway Management Planned: Nasal Cannula and Natural Airway ? ?Additional Equipment: None ? ?Intra-op Plan:  ? ?Post-operative Plan:  ? ?Informed Consent: I have reviewed the patients History and Physical, chart, labs and discussed the procedure including the risks, benefits and alternatives for the proposed anesthesia with the patient or authorized representative who has indicated his/her understanding and acceptance.  ? ? ? ? ? ?Plan Discussed with: CRNA ? ?Anesthesia Plan Comments:   ? ? ? ? ? ? ?Anesthesia Quick Evaluation ? ?

## 2021-06-09 ENCOUNTER — Encounter: Payer: Self-pay | Admitting: Ophthalmology

## 2021-06-12 ENCOUNTER — Other Ambulatory Visit: Payer: Self-pay

## 2021-06-30 ENCOUNTER — Other Ambulatory Visit: Payer: Self-pay

## 2021-07-03 ENCOUNTER — Telehealth: Payer: Self-pay | Admitting: Obstetrics and Gynecology

## 2021-07-03 NOTE — Telephone Encounter (Signed)
Pateint states the Prescribed RX for yuvfem does not work for her. Patient states there is alternative cream she is interested in. States Estrate vaginal cream was a suggestion from the pharmacy since the previous cream was not working. Please advise. [facility] pharmacy confirmed with patient.

## 2021-07-04 ENCOUNTER — Other Ambulatory Visit: Payer: Self-pay

## 2021-07-04 MED ORDER — ESTRADIOL 0.1 MG/GM VA CREA
1.0000 | TOPICAL_CREAM | VAGINAL | 4 refills | Status: DC
  Filled 2021-07-04: qty 42.5, 35d supply, fill #0

## 2021-07-07 ENCOUNTER — Encounter: Payer: Self-pay | Admitting: Gastroenterology

## 2021-07-07 ENCOUNTER — Other Ambulatory Visit: Payer: Self-pay

## 2021-07-08 ENCOUNTER — Other Ambulatory Visit: Payer: Self-pay

## 2021-07-08 ENCOUNTER — Encounter
Admission: RE | Disposition: A | Discharge status: Home / Self Care | Payer: Self-pay | Source: Home / Self Care | Attending: Gastroenterology

## 2021-07-08 ENCOUNTER — Ambulatory Visit: Payer: No Typology Code available for payment source | Admitting: Certified Registered Nurse Anesthetist

## 2021-07-08 ENCOUNTER — Ambulatory Visit
Admission: RE | Admit: 2021-07-08 | Discharge: 2021-07-08 | Disposition: A | Discharge status: Home / Self Care | Payer: No Typology Code available for payment source | Attending: Gastroenterology | Admitting: Gastroenterology

## 2021-07-08 ENCOUNTER — Encounter: Payer: Self-pay | Admitting: Gastroenterology

## 2021-07-08 DIAGNOSIS — Z87891 Personal history of nicotine dependence: Secondary | ICD-10-CM | POA: Diagnosis not present

## 2021-07-08 DIAGNOSIS — Z1211 Encounter for screening for malignant neoplasm of colon: Secondary | ICD-10-CM

## 2021-07-08 DIAGNOSIS — K640 First degree hemorrhoids: Secondary | ICD-10-CM | POA: Insufficient documentation

## 2021-07-08 DIAGNOSIS — Z8371 Family history of colonic polyps: Secondary | ICD-10-CM

## 2021-07-08 HISTORY — PX: COLONOSCOPY WITH PROPOFOL: SHX5780

## 2021-07-08 SURGERY — COLONOSCOPY WITH PROPOFOL
Anesthesia: General

## 2021-07-08 MED ORDER — PROPOFOL 500 MG/50ML IV EMUL
INTRAVENOUS | Status: AC
  Filled 2021-07-08: qty 100

## 2021-07-08 MED ORDER — SODIUM CHLORIDE 0.9 % IV SOLN: INTRAVENOUS | Status: DC

## 2021-07-08 MED ORDER — PROPOFOL 500 MG/50ML IV EMUL
INTRAVENOUS | Status: AC
  Filled 2021-07-08: qty 50

## 2021-07-08 MED ORDER — PROPOFOL 500 MG/50ML IV EMUL
INTRAVENOUS | Status: DC | PRN
  Administered 2021-07-08: 140 ug/kg/min via INTRAVENOUS

## 2021-07-08 MED ORDER — DIPHENHYDRAMINE HCL 50 MG/ML IJ SOLN
INTRAMUSCULAR | Status: AC
  Filled 2021-07-08: qty 1

## 2021-07-08 MED ORDER — PHENYLEPHRINE HCL-NACL 20-0.9 MG/250ML-% IV SOLN
INTRAVENOUS | Status: AC
  Filled 2021-07-08: qty 250

## 2021-07-08 MED ORDER — PROPOFOL 10 MG/ML IV BOLUS
INTRAVENOUS | Status: DC | PRN
  Administered 2021-07-08: 70 mg via INTRAVENOUS

## 2021-07-08 MED ORDER — LIDOCAINE HCL (CARDIAC) PF 100 MG/5ML IV SOSY
PREFILLED_SYRINGE | INTRAVENOUS | Status: DC | PRN
  Administered 2021-07-08: 50 mg via INTRAVENOUS

## 2021-07-08 MED ORDER — LIDOCAINE HCL (PF) 2 % IJ SOLN
INTRAMUSCULAR | Status: AC
  Filled 2021-07-08: qty 20

## 2021-07-08 NOTE — Anesthesia Procedure Notes (Signed)
Date/Time: 07/08/2021 8:07 AM Performed by: Joanette Gula, Euell Schiff, CRNA Pre-anesthesia Checklist: Patient identified, Emergency Drugs available, Suction available, Patient being monitored and Timeout performed Patient Re-evaluated:Patient Re-evaluated prior to induction Oxygen Delivery Method: Nasal cannula Induction Type: IV induction

## 2021-07-08 NOTE — H&P (Signed)
Midge Minium, MD [facility] [address] [place], [place] [address] Phone: [phone] Fax : [phone]  Primary Care Physician:  Sherlene Shams, MD Primary Gastroenterologist:  Dr. Servando Snare  Pre-Procedure History & Physical: HPI:  Heather Watson is a 66 y.o. female is here for a screening colonoscopy.   Past Medical History:  Diagnosis Date   Arthritis    Right knee   Family history of breast cancer    Gallstones    UTI (urinary tract infection)     Past Surgical History:  Procedure Laterality Date   BROW LIFT Bilateral 06/06/2021   Procedure: BLEPHAROPLASTY UPPER EYELID; W/ EXCESS SKIN BLEPHAROPTOSIS REPAIR; RESECT EX BILATERAL;  Surgeon: Imagene Riches, MD;  [facility]: [facility];  Service: Ophthalmology;  Laterality: Bilateral;  wants early arrival   COLONOSCOPY WITH PROPOFOL N/A 05/19/2016   Procedure: COLONOSCOPY WITH PROPOFOL;  Surgeon: Midge Minium, MD;  Location: [facility] END[facility];  Service: Endoscopy;  Laterality: N/A;    Prior to Admission medications   Medication Sig Start Date End Date Taking? Authorizing Provider  ALPRAZolam (XANAX) 0.25 MG tablet Take 1 tablet (0.25 mg total) by mouth 2 (two) times daily as needed for anxiety. Patient taking differently: Take 0.25 mg by mouth 2 (two) times daily as needed for anxiety. (Rare) 01/29/21  Yes Sherlene Shams, MD  Ascorbic Acid (VITAMIN C PO) Take by mouth daily.   Yes [provider]  Boswellia-Glucosamine-Vit D (OSTEO BI-FLEX ONE PER DAY PO) Take by mouth daily.   Yes [provider]  CALCIUM PO Take by mouth daily.   Yes [provider]  Cholecalciferol (VITAMIN D-3) 125 MCG (5000 UT) TABS Take by mouth daily.   Yes [provider]  Cranberry Extract 250 MG TABS Take 500 mg by mouth daily.   Yes [provider]  ELDERBERRY PO Take by mouth daily.   Yes [provider]  erythromycin ophthalmic ointment Apply to sutures 4 times a day for 10-12 days.   Discontinue if allergy develops and call our office 06/06/21  Yes Imagene Riches, MD  estradiol (ESTRACE VAGINAL) 0.1 MG/GM vaginal cream Place 1 Applicatorful vaginally 2 (two) times a week. 07/07/21  Yes Hildred Laser, MD  Estradiol 10 MCG TABS vaginal tablet Use 1 tablet daily for 2 weeks, then decrease to 1 tablet twice a week. 03/04/21  Yes Hildred Laser, MD  Ginger, Zingiber officinalis, (GINGER PO) Take by mouth daily.   Yes [provider]  magnesium gluconate (MAGONATE) 500 MG tablet Take 800 mg by mouth daily.   Yes [provider]  meloxicam (MOBIC) 15 MG tablet Take 1 tablet (15 mg total) by mouth daily. Patient taking differently: Take 15 mg by mouth daily. Takes prn (rare) 01/29/21  Yes Sherlene Shams, MD  Multiple Vitamins-Minerals (ZINC PO) Take by mouth daily.   Yes [provider]  Omega-3 1000 MG CAPS Take 1 capsule by mouth daily.   Yes [provider]  pneumococcal 20-valent conjugate vaccine (PREVNAR 20) 0.5 ML injection Inject into the muscle. 01/31/21  Yes Judyann Munson, MD  Probiotic Product (PROBIOTIC DAILY PO) Take by mouth daily.   Yes [provider]  Theanine 50 MG TBDP Take 100 mg by mouth.   Yes [provider]  traMADol (ULTRAM) 50 MG tablet Take 1 every 4-6 hours as needed for pain not controlled by Tylenol 06/06/21  Yes Imagene Riches, MD  vitamin B-12 (CYANOCOBALAMIN) 1000 MCG tablet Take 1,000 mcg  by mouth daily.   Yes [provider]    Allergies as of 05/27/2021 - Review Complete 05/26/2021  Allergen Reaction Noted   Iodine Rash and Itching 05/12/2012   Soy allergy Rash 07/14/2018    Family History  Problem Relation Age of Onset   Hypertension Mother    Arthritis Mother        osteoarthritis   Breast cancer Mother 52   Cancer Mother        stomach/intestine   Stroke Father    Hypertension Father    Cancer Sister        lymphoma   Hypertension Sister    Hypertension Brother    Heart  attack Maternal Grandfather    Stroke Paternal Grandfather    Hypertension Brother    Breast cancer Maternal Aunt 40   Cancer Paternal Uncle        unk type    Social History   Socioeconomic History   Marital status: Married    Spouse name: Not on file   Number of children: Not on file   Years of education: Not on file   Highest education level: Not on file  Occupational History   Not on file  Tobacco Use   Smoking status: Former    Types: Cigarettes    Quit date: 08/21/1981    Years since quitting: 39.9   Smokeless tobacco: Never  Vaping Use   Vaping Use: Never used  Substance and Sexual Activity   Alcohol use: Yes    Comment: occasional   Drug use: No   Sexual activity: Yes    Birth control/protection: Post-menopausal  Other Topics Concern   Not on file  Social History Narrative   Not on file   Social Determinants of Health   Financial Resource Strain: Not on file  Food Insecurity: Not on file  Transportation Needs: Not on file  Physical Activity: Not on file  Stress: Not on file  Social Connections: Not on file  Intimate Partner Violence: Not on file    Review of Systems: See HPI, otherwise negative ROS  Physical Exam: BP 104/78   Pulse 82   Temp (!) 97.5 F (36.4 C) (Temporal)   Resp 16   LMP 04/12/2006   SpO2 100%  General:   Alert,  pleasant and cooperative in NAD Head:  Normocephalic and atraumatic. Neck:  Supple; no masses or thyromegaly. Lungs:  Clear throughout to auscultation.    Heart:  Regular rate and rhythm. Abdomen:  Soft, nontender and nondistended. Normal bowel sounds, without guarding, and without rebound.   Neurologic:  Alert and  oriented x4;  grossly normal neurologically.  Impression/Plan: Heather Watson is now here to undergo a screening colonoscopy.  Risks, benefits, and alternatives regarding colonoscopy have been reviewed with the patient.  Questions have been answered.  All parties agreeable.

## 2021-07-08 NOTE — Anesthesia Postprocedure Evaluation (Signed)
Anesthesia Post Note  Patient: [name]  Procedure(s) Performed: COLONOSCOPY WITH PROPOFOL  Patient location during evaluation: Endoscopy Anesthesia Type: General Level of consciousness: awake and alert Pain management: pain level controlled Vital Signs Assessment: post-procedure vital signs reviewed and stable Respiratory status: spontaneous breathing, nonlabored ventilation, respiratory function stable and patient connected to nasal cannula oxygen Cardiovascular status: blood pressure returned to baseline and stable Postop Assessment: no apparent nausea or vomiting Anesthetic complications: no   No notable events documented.   Last Vitals:  Vitals:   07/08/21 0844 07/08/21 0855  BP:  114/74  Pulse:  71  Resp:    Temp:    SpO2: 100%     Last Pain:  Vitals:   07/08/21 0855  TempSrc:   PainSc: 0-No pain                 Heather Watson

## 2021-07-08 NOTE — Anesthesia Preprocedure Evaluation (Signed)
Anesthesia Evaluation  Patient identified by MRN, date of birth, ID band Patient awake    Reviewed: Allergy & Precautions, NPO status , Patient's Chart, lab work & pertinent test results  History of Anesthesia Complications Negative for: history of anesthetic complications  Airway Mallampati: III  TM Distance: <3 FB Neck ROM: full    Dental  (+) Chipped   Pulmonary neg shortness of breath, former smoker,    Pulmonary exam normal        Cardiovascular Exercise Tolerance: Good (-) anginanegative cardio ROS Normal cardiovascular exam     Neuro/Psych negative neurological ROS  negative psych ROS   GI/Hepatic negative GI ROS, Neg liver ROS, neg GERD  ,  Endo/Other  negative endocrine ROS  Renal/GU negative Renal ROS  negative genitourinary   Musculoskeletal   Abdominal   Peds  Hematology negative hematology ROS (+)   Anesthesia Other Findings Past Medical History: No date: Arthritis     Comment:  Right knee No date: Family history of breast cancer No date: Gallstones No date: UTI (urinary tract infection)  Past Surgical History: 06/06/2021: BROW LIFT; Bilateral     Comment:  Procedure: BLEPHAROPLASTY UPPER EYELID; W/ EXCESS SKIN               BLEPHAROPTOSIS REPAIR; RESECT EX BILATERAL;  Surgeon:               Karle Starch, MD;  Location: [place];                Service: Ophthalmology;  Laterality: Bilateral;  wants               early arrival 05/19/2016: COLONOSCOPY WITH PROPOFOL; N/A     Comment:  Procedure: COLONOSCOPY WITH PROPOFOL;  Surgeon: Lucilla Lame, MD;  Location: [facility];  Service: Endoscopy;              Laterality: N/A;     Reproductive/Obstetrics negative OB ROS                             Anesthesia Physical Anesthesia Plan  ASA: 2  Anesthesia Plan: General   Post-op Pain Management:    Induction: Intravenous  PONV Risk Score  and Plan: Propofol infusion and TIVA  Airway Management Planned: Natural Airway and Nasal Cannula  Additional Equipment:   Intra-op Plan:   Post-operative Plan:   Informed Consent: I have reviewed the patients History and Physical, chart, labs and discussed the procedure including the risks, benefits and alternatives for the proposed anesthesia with the patient or authorized representative who has indicated his/her understanding and acceptance.     Dental Advisory Given  Plan Discussed with: Anesthesiologist, CRNA and Surgeon  Anesthesia Plan Comments: (Patient consented for risks of anesthesia including but not limited to:  - adverse reactions to medications - risk of airway placement if required - damage to eyes, teeth, lips or other oral mucosa - nerve damage due to positioning  - sore throat or hoarseness - Damage to heart, brain, nerves, lungs, other parts of body or loss of life  Patient voiced understanding.)        Anesthesia Quick Evaluation

## 2021-07-08 NOTE — Op Note (Signed)
[facility] Gastroenterology Patient Name: Heather MansMaritza Vinluan Procedure Date: 07/08/2021 7:03 AM MRN: [id number] Account #: [id number] Date of Birth: 1955/10/29 Admit Type: Outpatient Age: [id number] [facility] 4 Gender: Female Note Status: Finalized Instrument Name: Prentice DockerColonoscope [id number] Procedure:             Colonoscopy Indications:           Screening for colorectal malignant neoplasm Providers:             Midge Miniumarren Javoris Star MD, MD Referring MD:          Duncan Dulleresa Tullo, MD (Referring MD) Medicines:             Propofol per Anesthesia Complications:         No immediate complications. Procedure:             Pre-Anesthesia Assessment:                        - Prior to the procedure, a History and Physical was                         performed, and patient medications and allergies were                         reviewed. The patient's tolerance of previous                         anesthesia was also reviewed. The risks and benefits                         of the procedure and the sedation options and risks                         were discussed with the patient. All questions were                         answered, and informed consent was obtained. Prior                         Anticoagulants: The patient has taken no previous                         anticoagulant or antiplatelet agents. ASA Grade                         Assessment: II - A patient with mild systemic disease.                         After reviewing the risks and benefits, the patient                         was deemed in satisfactory condition to undergo the                         procedure.                        After obtaining informed consent, the colonoscope was  passed under direct vision. Throughout the procedure,                         the patient's blood pressure, pulse, and oxygen                         saturations were monitored continuously. The                          colonoscopy was performed without difficulty. The                         patient tolerated the procedure well. The quality of                         the bowel preparation was excellent. The Colonoscope                         was introduced through the anus and advanced to the                         the cecum, identified by appendiceal orifice and                         ileocecal valve. The colonoscopy was performed without                         difficulty. Findings:      The perianal and digital rectal examinations were normal.      Non-bleeding internal hemorrhoids were found during retroflexion. The       hemorrhoids were Grade I (internal hemorrhoids that do not prolapse). Impression:            - Non-bleeding internal hemorrhoids.                        - No specimens collected. Recommendation:        - Discharge patient to home.                        - Resume previous diet.                        - Continue present medications.                        - Repeat colonoscopy in 5 years for surveillance. Procedure Code(s):     --- Professional ---                        [phone], Colonoscopy, flexible; diagnostic, including                         collection of specimen(s) by brushing or washing, when                         performed (separate procedure) Diagnosis Code(s):     --- Professional ---                        Z12.11, Encounter for screening for malignant neoplasm  of colon CPT copyright 2019 [employer]. All rights reserved. The codes documented in this report are preliminary and upon coder review may  be revised to meet current compliance requirements. Midge Minium MD, MD 07/08/2021 8:32:40 AM This report has been signed electronically. Number of Addenda: 0 Note Initiated On: 07/08/2021 7:03 AM Scope Withdrawal Time: 0 hours 10 minutes 29 seconds  Total Procedure Duration: 0 hours 20 minutes 27 seconds  Estimated Blood Loss:   Estimated blood loss: none.      [facility]

## 2021-07-08 NOTE — Transfer of Care (Signed)
Immediate Anesthesia Transfer of Care Note  Patient: Heather Watson  Procedure(s) Performed: COLONOSCOPY WITH PROPOFOL  Patient Location: Endoscopy Unit  Anesthesia Type:General  Level of Consciousness: drowsy  Airway & Oxygen Therapy: Patient Spontanous Breathing  Post-op Assessment: Report given to RN and Post -op Vital signs reviewed and stable  Post vital signs: Reviewed and stable  Last Vitals:  Vitals Value Taken Time  BP 98/60   Temp    Pulse 73 07/08/21 0833  Resp 12 07/08/21 0833  SpO2 100 % 07/08/21 0833  Vitals shown include unvalidated device data.  Last Pain:  Vitals:   07/08/21 0735  TempSrc: Temporal  PainSc: 0-No pain         Complications: No notable events documented.

## 2021-07-09 ENCOUNTER — Encounter: Payer: Self-pay | Admitting: Gastroenterology

## 2021-08-06 ENCOUNTER — Ambulatory Visit: Payer: No Typology Code available for payment source

## 2021-08-06 ENCOUNTER — Encounter: Payer: Self-pay | Admitting: Internal Medicine

## 2021-08-06 DIAGNOSIS — Z1239 Encounter for other screening for malignant neoplasm of breast: Secondary | ICD-10-CM

## 2021-08-06 DIAGNOSIS — Z1211 Encounter for screening for malignant neoplasm of colon: Secondary | ICD-10-CM

## 2021-08-07 NOTE — Telephone Encounter (Signed)
There is nothing I can do about your insurance's policy. . The MRI was  ordered as a screening test.    Regards,   Duncan Dull, MD

## 2021-08-12 ENCOUNTER — Ambulatory Visit
Admission: RE | Admit: 2021-08-12 | Discharge: 2021-08-12 | Disposition: A | Discharge status: Home / Self Care | Payer: No Typology Code available for payment source | Source: Ambulatory Visit | Attending: Internal Medicine | Admitting: Internal Medicine

## 2021-08-12 DIAGNOSIS — Z803 Family history of malignant neoplasm of breast: Secondary | ICD-10-CM

## 2021-08-12 MED ORDER — GADOBUTROL 1 MMOL/ML IV SOLN
5.0000 mL | Freq: Once | INTRAVENOUS | Status: AC | PRN
  Administered 2021-08-12: 5 mL via INTRAVENOUS

## 2021-11-07 ENCOUNTER — Other Ambulatory Visit: Payer: Self-pay

## 2021-11-07 MED ORDER — MELOXICAM 15 MG PO TABS
ORAL_TABLET | ORAL | 1 refills | Status: DC
  Filled 2021-11-07: qty 30, 30d supply, fill #0

## 2021-12-23 ENCOUNTER — Other Ambulatory Visit: Payer: Self-pay

## 2021-12-23 MED ORDER — MIEBO 1.338 GM/ML OP SOLN
OPHTHALMIC | 6 refills | Status: DC
  Filled 2021-12-23 – 2022-01-30 (×2): qty 3, 30d supply, fill #0

## 2021-12-24 ENCOUNTER — Other Ambulatory Visit: Payer: Self-pay

## 2022-01-14 ENCOUNTER — Other Ambulatory Visit: Payer: Self-pay | Admitting: Internal Medicine

## 2022-01-14 DIAGNOSIS — Z1231 Encounter for screening mammogram for malignant neoplasm of breast: Secondary | ICD-10-CM

## 2022-01-22 ENCOUNTER — Telehealth: Payer: Self-pay | Admitting: Internal Medicine

## 2022-01-22 DIAGNOSIS — Z8639 Personal history of other endocrine, nutritional and metabolic disease: Secondary | ICD-10-CM

## 2022-01-22 DIAGNOSIS — E559 Vitamin D deficiency, unspecified: Secondary | ICD-10-CM

## 2022-01-22 DIAGNOSIS — E538 Deficiency of other specified B group vitamins: Secondary | ICD-10-CM

## 2022-01-22 DIAGNOSIS — D500 Iron deficiency anemia secondary to blood loss (chronic): Secondary | ICD-10-CM

## 2022-01-22 NOTE — Telephone Encounter (Signed)
Patient has a lab appt 01/29/2022, there are no orders in. 

## 2022-01-22 NOTE — Telephone Encounter (Signed)
Labs have been ordered

## 2022-01-22 NOTE — Addendum Note (Signed)
Addended by: Sherlene Shams on: 01/22/2022 01:47 PM   Modules accepted: Orders

## 2022-01-29 ENCOUNTER — Other Ambulatory Visit (INDEPENDENT_AMBULATORY_CARE_PROVIDER_SITE_OTHER): Payer: No Typology Code available for payment source

## 2022-01-29 DIAGNOSIS — D500 Iron deficiency anemia secondary to blood loss (chronic): Secondary | ICD-10-CM

## 2022-01-29 DIAGNOSIS — E559 Vitamin D deficiency, unspecified: Secondary | ICD-10-CM

## 2022-01-29 DIAGNOSIS — E538 Deficiency of other specified B group vitamins: Secondary | ICD-10-CM | POA: Diagnosis not present

## 2022-01-29 DIAGNOSIS — Z8639 Personal history of other endocrine, nutritional and metabolic disease: Secondary | ICD-10-CM | POA: Diagnosis not present

## 2022-01-29 LAB — CBC WITH DIFFERENTIAL/PLATELET
Basophils Absolute: 0.1 10*3/uL (ref 0.0–0.1)
Basophils Relative: 1.9 % (ref 0.0–3.0)
Eosinophils Absolute: 0.1 10*3/uL (ref 0.0–0.7)
Eosinophils Relative: 2.2 % (ref 0.0–5.0)
HCT: 40.2 % (ref 36.0–46.0)
Hemoglobin: 13.6 g/dL (ref 12.0–15.0)
Lymphocytes Relative: 30.5 % (ref 12.0–46.0)
Lymphs Abs: 1.5 10*3/uL (ref 0.7–4.0)
MCHC: 33.9 g/dL (ref 30.0–36.0)
MCV: 91.9 fl (ref 78.0–100.0)
Monocytes Absolute: 0.4 10*3/uL (ref 0.1–1.0)
Monocytes Relative: 8.7 % (ref 3.0–12.0)
Neutro Abs: 2.8 10*3/uL (ref 1.4–7.7)
Neutrophils Relative %: 56.7 % (ref 43.0–77.0)
Platelets: 282 10*3/uL (ref 150.0–400.0)
RBC: 4.37 Mil/uL (ref 3.87–5.11)
RDW: 13.1 % (ref 11.5–15.5)
WBC: 4.9 10*3/uL (ref 4.0–10.5)

## 2022-01-29 LAB — COMPREHENSIVE METABOLIC PANEL
ALT: 18 U/L (ref 0–35)
AST: 24 U/L (ref 0–37)
Albumin: 4.4 g/dL (ref 3.5–5.2)
Alkaline Phosphatase: 64 U/L (ref 39–117)
BUN: 20 mg/dL (ref 6–23)
CO2: 31 mEq/L (ref 19–32)
Calcium: 9.3 mg/dL (ref 8.4–10.5)
Chloride: 103 mEq/L (ref 96–112)
Creatinine, Ser: 0.61 mg/dL (ref 0.40–1.20)
GFR: 93.4 mL/min (ref >60.00)
Glucose, Bld: 92 mg/dL (ref 70–99)
Potassium: 4.5 mEq/L (ref 3.5–5.1)
Sodium: 140 mEq/L (ref 135–145)
Total Bilirubin: 0.5 mg/dL (ref 0.2–1.2)
Total Protein: 6.7 g/dL (ref 6.0–8.3)

## 2022-01-29 LAB — VITAMIN D 25 HYDROXY (VIT D DEFICIENCY, FRACTURES): VITD: 50.33 ng/mL (ref 30.00–100.00)

## 2022-01-29 LAB — VITAMIN B12: Vitamin B-12: 431 pg/mL (ref 211–911)

## 2022-01-29 LAB — TSH: TSH: 1.32 u[IU]/mL (ref 0.35–5.50)

## 2022-01-30 ENCOUNTER — Other Ambulatory Visit: Payer: Self-pay

## 2022-02-02 ENCOUNTER — Other Ambulatory Visit: Payer: Self-pay

## 2022-02-02 MED ORDER — MIEBO 1.338 GM/ML OP SOLN
1.0000 [drp] | Freq: Four times a day (QID) | OPHTHALMIC | 6 refills | Status: DC
  Filled 2022-02-02: qty 3, 30d supply, fill #0
  Filled 2022-02-11: qty 3, 7d supply, fill #0
  Filled 2022-03-31: qty 3, 7d supply, fill #1
  Filled 2022-07-10: qty 3, 7d supply, fill #2
  Filled 2022-09-22: qty 3, 7d supply, fill #3
  Filled 2022-09-28: qty 3, 8d supply, fill #3
  Filled 2022-11-05: qty 3, 7d supply, fill #3

## 2022-02-03 ENCOUNTER — Ambulatory Visit (INDEPENDENT_AMBULATORY_CARE_PROVIDER_SITE_OTHER): Payer: No Typology Code available for payment source | Admitting: Internal Medicine

## 2022-02-03 ENCOUNTER — Encounter: Payer: Self-pay | Admitting: Internal Medicine

## 2022-02-03 VITALS — BP 118/70 | HR 74 | Temp 98.2°F | Ht 64.0 in | Wt 131.4 lb

## 2022-02-03 DIAGNOSIS — R87610 Atypical squamous cells of undetermined significance on cytologic smear of cervix (ASC-US): Secondary | ICD-10-CM | POA: Diagnosis not present

## 2022-02-03 DIAGNOSIS — Z0000 Encounter for general adult medical examination without abnormal findings: Secondary | ICD-10-CM | POA: Diagnosis not present

## 2022-02-03 DIAGNOSIS — R87810 Cervical high risk human papillomavirus (HPV) DNA test positive: Secondary | ICD-10-CM | POA: Diagnosis not present

## 2022-02-03 NOTE — Assessment & Plan Note (Signed)
She was referred to Dr Elesa Massed.  Colposcopy was negative march 2021 and annual PAP smear have been normal, last one Jan 2023 .  She prefers to continue annual PAP smears with me.

## 2022-02-03 NOTE — Progress Notes (Signed)
Patient ID: Heather Watson, female    DOB: 28-Jun-1955  Age: 66 y.o. MRN: [id number]  The patient is here for annual preventive examination and management of other chronic and acute problems.   The risk factors are reflected in the social history.   The roster of all physicians providing medical care to patient - is listed in the Snapshot section of the chart.   Activities of daily living:  The patient is 100% independent in all ADLs: dressing, toileting, feeding as well as independent mobility   Home safety : The patient has smoke detectors in the home. They wear seatbelts.  There are no unsecured firearms at home. There is no violence in the home.    There is no risks for hepatitis, STDs or HIV. There is no   history of blood transfusion. They have no travel history to infectious disease endemic areas of the world.   The patient has seen their dentist in the last six month. They have seen their eye doctor in the last year. The patinet  denies slight hearing difficulty with regard to whispered voices and some television programs.  They have deferred audiologic testing in the last year.  They do not  have excessive sun exposure. Discussed the need for sun protection: hats, long sleeves and use of sunscreen if there is significant sun exposure.    Diet: the importance of a healthy diet is discussed. They do have a healthy diet.   The benefits of regular aerobic exercise were discussed. The patient  exercises  3 to 5 days per week  for  60 minutes.    Depression screen: there are no signs or vegative symptoms of depression- irritability, change in appetite, anhedonia, sadness/tearfullness.   The following portions of the patient's history were reviewed and updated as appropriate: allergies, current medications, past family history, past medical history,  past surgical history, past social history  and problem list.   Visual acuity was not assessed per patient preference since the patient has  regular follow up with an  ophthalmologist. Hearing and body mass index were assessed and reviewed.    During the course of the visit the patient was educated and counseled about appropriate screening and preventive services including : fall prevention , diabetes screening, nutrition counseling, colorectal cancer screening, and recommended immunizations.    Chief Complaint:  Breast screening with MRI alternating with mammogram    Review of Symptoms  Patient denies headache, fevers, malaise, unintentional weight loss, skin rash, eye pain, sinus congestion and sinus pain, sore throat, dysphagia,  hemoptysis , cough, dyspnea, wheezing, chest pain, palpitations, orthopnea, edema, abdominal pain, nausea, melena, diarrhea, constipation, flank pain, dysuria, hematuria, urinary  Frequency, nocturia, numbness, tingling, seizures,  Focal weakness, Loss of consciousness,  Tremor, insomnia, depression, anxiety, and suicidal ideation.    Physical Exam:  BP 118/70   Pulse 74   Temp 98.2 F (36.8 C) (Oral)   Ht 5\' 4"  (1.626 m)   Wt 131 lb 6.4 oz (59.6 kg)   LMP 04/12/2006   SpO2 98%   BMI 22.55 kg/m    Physical Exam Vitals reviewed.  Constitutional:      General: She is not in acute distress.    Appearance: Normal appearance. She is well-developed and normal weight. She is not ill-appearing, toxic-appearing or diaphoretic.  HENT:     Head: Normocephalic.     Right Ear: Tympanic membrane, ear canal and external ear normal. There is no impacted cerumen.  Left Ear: Tympanic membrane, ear canal and external ear normal. There is no impacted cerumen.     Nose: Nose normal.     Mouth/Throat:     Mouth: Mucous membranes are moist.     Pharynx: Oropharynx is clear.  Eyes:     General: No scleral icterus.       Right eye: No discharge.        Left eye: No discharge.     Conjunctiva/sclera: Conjunctivae normal.     Pupils: Pupils are equal, round, and reactive to light.  Neck:     Thyroid: No  thyromegaly.     Vascular: No carotid bruit or JVD.  Cardiovascular:     Rate and Rhythm: Normal rate and regular rhythm.     Heart sounds: Normal heart sounds.  Pulmonary:     Effort: Pulmonary effort is normal. No respiratory distress.     Breath sounds: Normal breath sounds.  Chest:  Breasts:    Breasts are symmetrical.     Right: Normal. No swelling, inverted nipple, mass, nipple discharge, skin change or tenderness.     Left: Normal. No swelling, inverted nipple, mass, nipple discharge, skin change or tenderness.  Abdominal:     General: Bowel sounds are normal.     Palpations: Abdomen is soft. There is no mass.     Tenderness: There is no abdominal tenderness. There is no guarding or rebound.  Musculoskeletal:        General: Normal range of motion.     Cervical back: Normal range of motion and neck supple.  Lymphadenopathy:     Cervical: No cervical adenopathy.     Upper Body:     Right upper body: No supraclavicular, axillary or pectoral adenopathy.     Left upper body: No supraclavicular, axillary or pectoral adenopathy.  Skin:    General: Skin is warm and dry.  Neurological:     General: No focal deficit present.     Mental Status: She is alert and oriented to person, place, and time. Mental status is at baseline.  Psychiatric:        Mood and Affect: Mood normal.        Behavior: Behavior normal.        Thought Content: Thought content normal.        Judgment: Judgment normal.     Assessment and Plan: ASCUS with positive high risk HPV cervical Assessment & Plan: She was referred to Dr Elesa Massed.  Colposcopy was negative march 2021 and annual PAP smear have been normal, last one Jan 2023 .  She prefers to continue annual PAP smears with me.    Encounter for preventive health examination Assessment & Plan: age appropriate education and counseling updated, referrals for preventative services and immunizations addressed, dietary and smoking counseling addressed, most  recent labs reviewed.  I have personally reviewed and have noted:   1) the patient's medical and social history 2) The pt's use of alcohol, tobacco, and illicit drugs 3) The patient's current medications and supplements 4) Functional ability including ADL's, fall risk, home safety risk, hearing and visual impairment 5) Diet and physical activities 6) Evidence for depression or mood disorder 7) The patient's height, weight, and BMI have been recorded in the chart  I have made referrals, and provided counseling and education based on review of the above      Return in about 1 year (around 02/04/2023) for physical.  Sherlene Shams, MD

## 2022-02-03 NOTE — Patient Instructions (Addendum)
I will repeat your PAP smear at next year's  Physical  in Dec 2024  We will repeat your DEXA scan in 2025   You can have your labs done next year at [facility] if you will remind me in a [facility] message a week prior to your visit

## 2022-02-03 NOTE — Assessment & Plan Note (Signed)

## 2022-02-04 ENCOUNTER — Encounter: Payer: No Typology Code available for payment source | Admitting: Internal Medicine

## 2022-02-11 ENCOUNTER — Other Ambulatory Visit: Payer: Self-pay

## 2022-02-13 ENCOUNTER — Other Ambulatory Visit: Payer: Self-pay

## 2022-02-19 ENCOUNTER — Ambulatory Visit
Admission: RE | Admit: 2022-02-19 | Discharge: 2022-02-19 | Disposition: A | Discharge status: Home / Self Care | Payer: 59 | Source: Ambulatory Visit | Attending: Internal Medicine | Admitting: Internal Medicine

## 2022-02-19 DIAGNOSIS — Z1231 Encounter for screening mammogram for malignant neoplasm of breast: Secondary | ICD-10-CM | POA: Diagnosis not present

## 2022-03-26 ENCOUNTER — Encounter: Payer: Self-pay | Admitting: Obstetrics and Gynecology

## 2022-03-31 ENCOUNTER — Other Ambulatory Visit: Payer: Self-pay

## 2022-04-06 ENCOUNTER — Encounter: Payer: Self-pay | Admitting: Internal Medicine

## 2022-04-06 ENCOUNTER — Other Ambulatory Visit: Payer: Self-pay

## 2022-04-07 ENCOUNTER — Other Ambulatory Visit: Payer: Self-pay

## 2022-04-07 MED ORDER — ESTRADIOL 0.1 MG/GM VA CREA
1.0000 | TOPICAL_CREAM | Freq: Every day | VAGINAL | 12 refills | Status: DC
  Filled 2022-04-07: qty 42.5, 28d supply, fill #0
  Filled 2022-04-30: qty 42.5, 28d supply, fill #1
  Filled 2022-05-28: qty 42.5, 28d supply, fill #2
  Filled 2022-10-30: qty 42.5, 28d supply, fill #3
  Filled 2023-02-18: qty 42.5, 28d supply, fill #4

## 2022-05-28 ENCOUNTER — Other Ambulatory Visit: Payer: Self-pay

## 2022-06-02 ENCOUNTER — Telehealth: Payer: Self-pay | Admitting: Internal Medicine

## 2022-06-02 DIAGNOSIS — H6122 Impacted cerumen, left ear: Secondary | ICD-10-CM | POA: Diagnosis not present

## 2022-06-02 DIAGNOSIS — H9312 Tinnitus, left ear: Secondary | ICD-10-CM | POA: Diagnosis not present

## 2022-06-02 NOTE — Telephone Encounter (Signed)
Lft pt vm to call ofc to sch MRI. thanks 

## 2022-06-02 NOTE — Telephone Encounter (Signed)
Pt returning call

## 2022-06-07 ENCOUNTER — Other Ambulatory Visit: Payer: Self-pay | Admitting: Internal Medicine

## 2022-06-07 DIAGNOSIS — Z803 Family history of malignant neoplasm of breast: Secondary | ICD-10-CM

## 2022-06-07 DIAGNOSIS — Z83719 Family history of colon polyps, unspecified: Secondary | ICD-10-CM

## 2022-06-07 DIAGNOSIS — Z1239 Encounter for other screening for malignant neoplasm of breast: Secondary | ICD-10-CM

## 2022-07-10 ENCOUNTER — Other Ambulatory Visit: Payer: Self-pay

## 2022-07-14 ENCOUNTER — Other Ambulatory Visit: Payer: Self-pay

## 2022-07-14 ENCOUNTER — Encounter: Payer: Self-pay | Admitting: Internal Medicine

## 2022-07-14 MED ORDER — DICLOFENAC SODIUM 1 % EX GEL
2.0000 g | Freq: Four times a day (QID) | CUTANEOUS | 2 refills | Status: AC
  Filled 2022-07-14: qty 300, 37d supply, fill #0
  Filled 2023-02-08: qty 100, 13d supply, fill #1
  Filled 2023-02-09: qty 300, 37d supply, fill #1

## 2022-07-15 ENCOUNTER — Other Ambulatory Visit: Payer: Self-pay

## 2022-08-07 ENCOUNTER — Ambulatory Visit
Admission: EM | Admit: 2022-08-07 | Discharge: 2022-08-07 | Disposition: A | Discharge status: Home / Self Care | Payer: 59 | Attending: Urgent Care | Admitting: Urgent Care

## 2022-08-07 ENCOUNTER — Telehealth: Payer: Self-pay | Admitting: Internal Medicine

## 2022-08-07 DIAGNOSIS — T7840XA Allergy, unspecified, initial encounter: Secondary | ICD-10-CM

## 2022-08-07 MED ORDER — DEXAMETHASONE SODIUM PHOSPHATE 10 MG/ML IJ SOLN
10.0000 mg | Freq: Once | INTRAMUSCULAR | Status: AC
  Administered 2022-08-07: 10 mg via INTRAMUSCULAR

## 2022-08-07 NOTE — ED Provider Notes (Signed)
Heather Watson    CSN: [id number] Arrival date & time: 08/07/22  0854      History   Chief Complaint Chief Complaint  Patient presents with   Rash    HPI Heather Watson is a 67 y.o. female.    Rash   Presents to urgent care with concern for rash x 1 day.  Patient states she has allergy to iodine and came in contact with some yesterday.  Patient states she has been using "cricket powder" for the past 10 days and started with erythematous rash yesterday.  She discovered that the supplement contains "iodine" to which she has an allergy.  Rashes on bilateral arms, torso, legs bilaterally.  No trismus, drooling, hoarseness, stridor.  Patient states she is taking a trip tomorrow involving air travel.  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   Encounter for preventive health examination 01/08/2013   Menopause 05/14/2012   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   H/O thyroid cyst 05/14/2012    Past Surgical History:  Procedure Laterality Date   BROW LIFT Bilateral 06/06/2021   Procedure: BLEPHAROPLASTY UPPER EYELID; W/ EXCESS SKIN BLEPHAROPTOSIS REPAIR; RESECT EX BILATERAL;  Surgeon: Imagene Riches, MD;  Location: [facility];  Service: Ophthalmology;  Laterality: Bilateral;  wants early arrival   COLONOSCOPY WITH PROPOFOL N/A 05/19/2016   Procedure: COLONOSCOPY WITH PROPOFOL;  Surgeon: Midge Minium, MD;   Location: [facility] ENDOSCOPY;  Service: Endoscopy;  Laterality: N/A;   COLONOSCOPY WITH PROPOFOL N/A 07/08/2021   Procedure: COLONOSCOPY WITH PROPOFOL;  Surgeon: Midge Minium, MD;  Location: [facility] ENDOSCOPY;  Service: Endoscopy;  Laterality: N/A;  1ST CASE< PLEASE    OB History     Gravida  3   Para  3   Term  3   Preterm      AB      Living         SAB      IAB      Ectopic      Multiple      Live Births               Home Medications    Prior to Admission medications   Medication Sig Start Date End Date Taking? Authorizing Provider  ALPRAZolam (XANAX) 0.25 MG tablet Take 1 tablet (0.25 mg total) by mouth 2 (two) times daily as needed for anxiety. Patient taking differently: Take 0.25 mg by mouth 2 (two) times daily as needed for anxiety. (Rare) 01/29/21  Yes Sherlene Shams, MD  Ascorbic Acid (VITAMIN C PO) Take by mouth daily.   Yes [provider]  Boswellia-Glucosamine-Vit D (OSTEO BI-FLEX ONE PER DAY PO) Take by mouth daily.   Yes [provider]  Cholecalciferol (VITAMIN D-3) 125 MCG (5000 UT) TABS Take by mouth daily.   Yes [provider]  Cranberry Extract  250 MG TABS Take 500 mg by mouth daily.   Yes [provider]  diclofenac Sodium (VOLTAREN) 1 % GEL Apply 2 g topically 4 (four) times daily. 07/14/22  Yes Sherlene Shams, MD  ELDERBERRY PO Take by mouth daily.   Yes [provider]  estradiol (ESTRACE) 0.1 MG/GM vaginal cream Place 1 Applicatorful vaginally at bedtime. FOR 14 DAYS, then twice weekly thereafter 04/07/22  Yes Sherlene Shams, MD  magnesium gluconate (MAGONATE) 500 MG tablet Take 800 mg by mouth daily.   Yes [provider]  Multiple Vitamins-Minerals (ZINC PO) Take by mouth daily.   Yes [provider]  Omega-3 1000 MG CAPS Take 1 capsule by mouth daily.   Yes [provider]  vitamin B-12 (CYANOCOBALAMIN) 1000 MCG tablet Take 1,000 mcg by mouth daily.   Yes [provider]  meloxicam (MOBIC) 15 MG tablet Take 1 tablet (15 mg total) by mouth daily. Patient taking differently: Take 15 mg by mouth daily. Takes prn (rare) 01/29/21   Sherlene Shams, MD  Perfluorohexyloctane (MIEBO) 1.338 GM/ML SOLN Place 1 drop into both eyes 4 (four) times daily. 02/02/22       Family History Family History  Problem Relation Age of Onset   Hypertension Mother    Arthritis Mother        osteoarthritis   Breast cancer Mother 63   Cancer Mother        stomach/intestine   Stroke Father    Hypertension Father    Cancer Sister        lymphoma   Hypertension Sister    Hypertension Brother    Heart attack Maternal Grandfather    Stroke Paternal Grandfather    Hypertension Brother    Breast cancer Maternal Aunt 40   Cancer Paternal Uncle        unk type    Social History Social History   Tobacco Use   Smoking status: Former    Types: Cigarettes    Quit date: 08/21/1981    Years since quitting: 40.9   Smokeless tobacco: Never  Vaping Use   Vaping Use: Never used  Substance Use Topics   Alcohol use: Yes    Comment: occasional   Drug use: No     Allergies   Iodine and Soy allergy   Review of Systems Review of Systems  Skin:  Positive for rash.     Physical Exam Triage Vital Signs ED Triage Vitals [08/07/22 0922]  Enc Vitals Group     BP 115/80     Pulse Rate 84     Resp 18     Temp 99.1 F (37.3 C)     Temp Source Oral     SpO2 97 %     Weight      Height      Head Circumference      Peak Flow      Pain Score      Pain Loc      Pain Edu?      Excl. in GC?    No data found.  Updated Vital Signs BP 115/80 (BP Location: Left Arm)   Pulse 84   Temp 99.1 F (37.3 C) (Oral)   Resp 18   LMP 04/12/2006   SpO2 97%   Visual Acuity Right Eye Distance:   Left Eye Distance:   Bilateral Distance:    Right Eye Near:   Left Eye Near:    Bilateral Near:  Physical Exam Vitals reviewed.  Constitutional:      Appearance:  Normal appearance.  Skin:    General: Skin is warm and dry.     Findings: Erythema and rash present.  Neurological:     General: No focal deficit present.     Mental Status: She is alert and oriented to person, place, and time.  Psychiatric:        Mood and Affect: Mood normal.        Behavior: Behavior normal.      UC Treatments / Results  Labs (all labs ordered are listed, but only abnormal results are displayed) Labs Reviewed - No data to display  EKG   Radiology No results found.  Procedures Procedures (including critical care time)  Medications Ordered in UC Medications - No data to display  Initial Impression / Assessment and Plan / UC Course  I have reviewed the triage vital signs and the nursing notes.  Pertinent labs & imaging results that were available during my care of the patient were reviewed by me and considered in my medical decision making (see chart for details).   DERENDA GIDDINGS is a 67 y.o. female presenting with rash. Patient is afebrile without recent antipyretics, satting well on room air. Overall is well appearing, well hydrated, without respiratory distress. Pulmonary exam is unremarkable.  Lungs CTAB without wheezing, rhonchi, rales. RRR.  Reviewed relevant chart history.   Apparent allergic reaction to active ingredient in her supplement.  Will treat in clinic with Decadron 10 mg IM and recommend continued use of antihistamines at home.  Patient has been using cetirizine 10 mg daily.  Told her she could use diphenhydramine and would recommend using it especially at nighttime to assist with sleep.  Patient has a previously prescribed prednisone taper for allergic reactions and recommended that she start this medication if her symptoms recur or continue tomorrow.  Counseled patient on potential for adverse effects with medications prescribed/recommended today, ER and return-to-clinic precautions discussed, patient verbalized understanding and  agreement with care plan.  Final Clinical Impressions(s) / UC Diagnoses   Final diagnoses:  None   Discharge Instructions   None    ED Prescriptions   None    PDMP not reviewed this encounter.   Charma Igo, [place] 08/07/22 [phone]

## 2022-08-07 NOTE — ED Triage Notes (Signed)
Pt is here for rash x 1 day. Pt reports she allergic to iodine and came in contact with some yesterday.

## 2022-08-07 NOTE — Telephone Encounter (Signed)
Pt called in stating that she had an allergic reaction to Iodine and was calling to se provider. We dont have any more appt opening for today.  I transferred the call to access nurse, however, during the process pt hang up. I call pt back, again during the process I guess because the wait was a little long she hang up again. The nurse took her info and she will try to call pt back. Pt its currently in the UC.

## 2022-08-07 NOTE — Telephone Encounter (Signed)
noted 

## 2022-08-07 NOTE — Discharge Instructions (Signed)
Continue to take cetirizine (Zyrtec) daily.  You may take twice the dose recommended on the over-the-counter packaging.  You may take diphenhydramine as well to control your symptoms however this medication is sedating.  I suggest using it at nighttime.  You have been injected with a steroid today to help control your rash.  If you find this medication is not adequately controlling your symptoms, if the rash continues, or if the rash recurs, start taking the prednisone that was prescribed by your other provider.  Follow up here or with your primary care provider if your symptoms are worsening or not improving.

## 2022-08-24 ENCOUNTER — Other Ambulatory Visit: Payer: Self-pay

## 2022-08-26 ENCOUNTER — Other Ambulatory Visit: Payer: Self-pay | Admitting: Oncology

## 2022-08-26 DIAGNOSIS — Z006 Encounter for examination for normal comparison and control in clinical research program: Secondary | ICD-10-CM

## 2022-08-27 ENCOUNTER — Other Ambulatory Visit
Admission: RE | Admit: 2022-08-27 | Discharge: 2022-08-27 | Disposition: A | Discharge status: Home / Self Care | Payer: Self-pay | Attending: Oncology | Admitting: Oncology

## 2022-08-27 DIAGNOSIS — Z006 Encounter for examination for normal comparison and control in clinical research program: Secondary | ICD-10-CM

## 2022-08-28 ENCOUNTER — Ambulatory Visit: Payer: 59 | Admitting: Nurse Practitioner

## 2022-08-28 ENCOUNTER — Other Ambulatory Visit: Payer: Self-pay

## 2022-08-28 ENCOUNTER — Telehealth: Payer: Self-pay | Admitting: Internal Medicine

## 2022-08-28 VITALS — BP 116/72 | HR 75 | Temp 98.2°F | Ht 64.0 in | Wt 130.4 lb

## 2022-08-28 DIAGNOSIS — Z8639 Personal history of other endocrine, nutritional and metabolic disease: Secondary | ICD-10-CM

## 2022-08-28 DIAGNOSIS — R058 Other specified cough: Secondary | ICD-10-CM | POA: Diagnosis not present

## 2022-08-28 DIAGNOSIS — Z0000 Encounter for general adult medical examination without abnormal findings: Secondary | ICD-10-CM

## 2022-08-28 DIAGNOSIS — E538 Deficiency of other specified B group vitamins: Secondary | ICD-10-CM

## 2022-08-28 DIAGNOSIS — E559 Vitamin D deficiency, unspecified: Secondary | ICD-10-CM

## 2022-08-28 DIAGNOSIS — E782 Mixed hyperlipidemia: Secondary | ICD-10-CM

## 2022-08-28 MED ORDER — AMOXICILLIN-POT CLAVULANATE 875-125 MG PO TABS
1.0000 | ORAL_TABLET | Freq: Two times a day (BID) | ORAL | 0 refills | Status: DC
  Filled 2022-08-28: qty 20, 10d supply, fill #0

## 2022-08-28 MED ORDER — LEVOCETIRIZINE DIHYDROCHLORIDE 5 MG PO TABS
5.0000 mg | ORAL_TABLET | Freq: Every evening | ORAL | 1 refills | Status: DC
  Filled 2022-08-28: qty 30, 30d supply, fill #0

## 2022-08-28 MED ORDER — PREDNISONE 20 MG PO TABS
20.0000 mg | ORAL_TABLET | Freq: Every day | ORAL | 0 refills | Status: AC
  Filled 2022-08-28: qty 5, 5d supply, fill #0

## 2022-08-28 NOTE — Telephone Encounter (Signed)
During check out today pt mentioned that she's having a physical in December and was wondering if Dr. Darrick Huntsman needs blood work before then??

## 2022-08-28 NOTE — Telephone Encounter (Signed)
Last labs were in December 2023.  I have pended some labs for your approval

## 2022-08-28 NOTE — Patient Instructions (Addendum)
Sent xyzal, prednisone and Augmentin to the pharmacy.  If the symptoms are not improving can start taking the antibiotic in 1- 2 days. Increase fluid in take.  Hope you have a wonderful trip.

## 2022-08-28 NOTE — Progress Notes (Signed)
Established Patient Office Visit  Subjective:  Patient ID: Heather Watson, female    DOB: 1955-11-22  Age: 67 y.o. MRN: [id number]  CC:  Chief Complaint  Patient presents with   Cough    Dry cough x 1 month    HPI  [name] presents for dry cough started 5 days ago.  Cough This is a new problem. The current episode started in the past 7 days. The problem has been unchanged. The cough is Non-productive. Pertinent negatives include no chest pain, fever, nasal congestion, sore throat or shortness of breath. Nothing aggravates the symptoms. There is no history of asthma or environmental allergies.    Patient is going to travel out of country in 2 days to visit her newborn granddaughter.  Past Medical History:  Diagnosis Date   Arthritis    Right knee   Family history of breast cancer    Gallstones    UTI (urinary tract infection)     Past Surgical History:  Procedure Laterality Date   BROW LIFT Bilateral 06/06/2021   Procedure: BLEPHAROPLASTY UPPER EYELID; W/ EXCESS SKIN BLEPHAROPTOSIS REPAIR; RESECT EX BILATERAL;  Surgeon: Imagene Riches, MD;  Location: [facility];  Service: Ophthalmology;  Laterality: Bilateral;  wants early arrival   COLONOSCOPY WITH PROPOFOL N/A 05/19/2016   Procedure: COLONOSCOPY WITH PROPOFOL;  Surgeon: Midge Minium, MD;  Location: [facility];  Service: Endoscopy;  Laterality: N/A;   COLONOSCOPY WITH PROPOFOL N/A 07/08/2021   Procedure: COLONOSCOPY WITH PROPOFOL;  Surgeon: Midge Minium, MD;  Location: [facility];  Service: Endoscopy;  Laterality: N/A;  1ST CASE< PLEASE    Family History  Problem Relation Age of Onset   Hypertension Mother    Arthritis Mother        osteoarthritis   Breast cancer Mother 77   Cancer Mother        stomach/intestine   Stroke Father    Hypertension Father    Cancer Sister        lymphoma   Hypertension Sister    Hypertension Brother    Heart attack Maternal Grandfather    Stroke Paternal  Grandfather    Hypertension Brother    Breast cancer Maternal Aunt 40   Cancer Paternal Uncle        unk type    Social History   Socioeconomic History   Marital status: Divorced    Spouse name: Not on file   Number of children: Not on file   Years of education: Not on file   Highest education level: Not on file  Occupational History   Not on file  Tobacco Use   Smoking status: Former    Current packs/day: 0.00    Types: Cigarettes    Quit date: 08/21/1981    Years since quitting: 41.0   Smokeless tobacco: Never  Vaping Use   Vaping status: Never Used  Substance and Sexual Activity   Alcohol use: Yes    Comment: occasional   Drug use: No   Sexual activity: Yes    Birth control/protection: Post-menopausal  Other Topics Concern   Not on file  Social History Narrative   Not on file   Social Determinants of Health   Financial Resource Strain: Not on file  Food Insecurity: Not on file  Transportation Needs: Not on file  Physical Activity: Not on file  Stress: Not on file  Social Connections: Unknown (01/22/2018)   Social Connection and Isolation Panel [NHANES]    Frequency of  Communication with Friends and Family: Not on file    Frequency of Social Gatherings with Friends and Family: Not on file    Attends Religious Services: Not on file    Active Member of Clubs or Organizations: Not on file    Attends Banker Meetings: Not on file    Marital Status: Divorced  Intimate Partner Violence: Not At Risk (01/22/2018)   Humiliation, Afraid, Rape, and Kick questionnaire    Fear of Current or Ex-Partner: No    Emotionally Abused: No    Physically Abused: No    Sexually Abused: No     Outpatient Medications Prior to Visit  Medication Sig Dispense Refill   ALPRAZolam (XANAX) 0.25 MG tablet Take 1 tablet (0.25 mg total) by mouth 2 (two) times daily as needed for anxiety. (Patient taking differently: Take 0.25 mg by mouth 2 (two) times daily as needed for  anxiety. (Rare)) 30 tablet 0   Ascorbic Acid (VITAMIN C PO) Take by mouth daily.     Boswellia-Glucosamine-Vit D (OSTEO BI-FLEX ONE PER DAY PO) Take by mouth daily.     Cholecalciferol (VITAMIN D-3) 125 MCG (5000 UT) TABS Take by mouth daily.     Cranberry Extract 250 MG TABS Take 500 mg by mouth daily.     diclofenac Sodium (VOLTAREN) 1 % GEL Apply 2 g topically 4 (four) times daily. 350 g 2   ELDERBERRY PO Take by mouth daily.     estradiol (ESTRACE) 0.1 MG/GM vaginal cream Place 1 Applicatorful vaginally at bedtime. FOR 14 DAYS, then twice weekly thereafter 42.5 g 12   magnesium gluconate (MAGONATE) 500 MG tablet Take 800 mg by mouth daily.     meloxicam (MOBIC) 15 MG tablet Take 1 tablet (15 mg total) by mouth daily. (Patient taking differently: Take 15 mg by mouth daily. Takes prn (rare)) 90 tablet 1   Multiple Vitamins-Minerals (ZINC PO) Take by mouth daily.     Omega-3 1000 MG CAPS Take 1 capsule by mouth daily.     Perfluorohexyloctane (MIEBO) 1.338 GM/ML SOLN Place 1 drop into both eyes 4 (four) times daily. 3 mL 6   vitamin B-12 (CYANOCOBALAMIN) 1000 MCG tablet Take 1,000 mcg by mouth daily. (Patient not taking: Reported on 08/28/2022)     No facility-administered medications prior to visit.    Allergies  Allergen Reactions   Iodine Rash and Itching    Contrast through IV   Soy Allergy Rash    ROS Review of Systems  Constitutional:  Negative for fever.  HENT:  Negative for sore throat.   Respiratory:  Positive for cough. Negative for shortness of breath.   Cardiovascular:  Negative for chest pain.  Allergic/Immunologic: Negative for environmental allergies.   Negative unless indicated in HPI.    Objective:    Physical Exam Constitutional:      Appearance: Normal appearance.  HENT:     Right Ear: Tympanic membrane normal. Tympanic membrane is not erythematous.     Left Ear: Tympanic membrane normal. Tympanic membrane is not erythematous.     Nose:     Right  Turbinates: Not enlarged.     Left Turbinates: Not enlarged.     Right Sinus: No maxillary sinus tenderness or frontal sinus tenderness.     Left Sinus: No maxillary sinus tenderness or frontal sinus tenderness.     Mouth/Throat:     Mouth: Mucous membranes are moist.     Pharynx: No pharyngeal swelling, oropharyngeal exudate or posterior oropharyngeal erythema.  Tonsils: No tonsillar exudate.  Cardiovascular:     Rate and Rhythm: Normal rate and regular rhythm.  Pulmonary:     Effort: Pulmonary effort is normal.     Breath sounds: Normal breath sounds. No stridor. No wheezing.  Neurological:     General: No focal deficit present.     Mental Status: She is alert and oriented to person, place, and time. Mental status is at baseline.  Psychiatric:        Mood and Affect: Mood normal.        Behavior: Behavior normal.        Thought Content: Thought content normal.        Judgment: Judgment normal.     BP 116/72 (BP Location: Left Arm, Patient Position: Sitting, Cuff Size: Normal)   Pulse 75   Temp 98.2 F (36.8 C) (Oral)   Ht 5\' 4"  (1.626 m)   Wt 130 lb 6.4 oz (59.1 kg)   LMP 04/12/2006   SpO2 97%   BMI 22.38 kg/m  Wt Readings from Last 3 Encounters:  08/28/22 130 lb 6.4 oz (59.1 kg)  02/03/22 131 lb 6.4 oz (59.6 kg)  06/06/21 126 lb (57.2 kg)     Health Maintenance  Topic Date Due   COVID-19 Vaccine (5 - 2023-24 season) 10/17/2021   INFLUENZA VACCINE  09/17/2022   MAMMOGRAM  02/20/2024   Colonoscopy  07/09/2026   DTaP/Tdap/Td (3 - Td or Tdap) 01/21/2028   Pneumonia Vaccine 73+ Years old  Completed   DEXA SCAN  Completed   Hepatitis C Screening  Completed   Zoster Vaccines- Shingrix  Completed   HPV VACCINES  Aged Out    There are no preventive care reminders to display for this patient.  Lab Results  Component Value Date   TSH 1.32 01/29/2022   Lab Results  Component Value Date   WBC 4.9 01/29/2022   HGB 13.6 01/29/2022   HCT 40.2 01/29/2022   MCV  91.9 01/29/2022   PLT 282.0 01/29/2022   Lab Results  Component Value Date   NA 140 01/29/2022   K 4.5 01/29/2022   CO2 31 01/29/2022   GLUCOSE 92 01/29/2022   BUN 20 01/29/2022   CREATININE 0.61 01/29/2022   BILITOT 0.5 01/29/2022   ALKPHOS 64 01/29/2022   AST 24 01/29/2022   ALT 18 01/29/2022   PROT 6.7 01/29/2022   ALBUMIN 4.4 01/29/2022   CALCIUM 9.3 01/29/2022   GFR 93.40 01/29/2022   Lab Results  Component Value Date   CHOL 166 01/27/2021   Lab Results  Component Value Date   HDL 78.80 01/27/2021   Lab Results  Component Value Date   LDLCALC 76 01/27/2021   Lab Results  Component Value Date   TRIG 54.0 01/27/2021   Lab Results  Component Value Date   CHOLHDL 2 01/27/2021   Lab Results  Component Value Date   HGBA1C 5.2 01/25/2020      Assessment & Plan:  Other cough Assessment & Plan: Augmentin, prednisone and levocetirizine sent to the pharmacy. Advised patient to start taking the antibiotic if symptoms do not improve in 2 to 3 days. Increase fluid intake.   Other orders -     Levocetirizine Dihydrochloride; Take 1 tablet (5 mg total) by mouth every evening.  Dispense: 30 tablet; Refill: 1 -     Amoxicillin-Pot Clavulanate; Take 1 tablet by mouth 2 (two) times daily.  Dispense: 20 tablet; Refill: 0 -     predniSONE; Take  1 tablet (20 mg total) by mouth daily with breakfast for 5 days.  Dispense: 5 tablet; Refill: 0    Follow-up: No follow-ups on file.   Kara Dies, NP

## 2022-09-10 LAB — HELIX MOLECULAR SCREEN: Genetic Analysis Overall Interpretation: NEGATIVE

## 2022-09-13 DIAGNOSIS — R059 Cough, unspecified: Secondary | ICD-10-CM | POA: Insufficient documentation

## 2022-09-13 NOTE — Assessment & Plan Note (Signed)
Augmentin, prednisone and levocetirizine sent to the pharmacy. Advised patient to start taking the antibiotic if symptoms do not improve in 2 to 3 days. Increase fluid intake.

## 2022-09-22 ENCOUNTER — Other Ambulatory Visit: Payer: Self-pay

## 2022-09-25 ENCOUNTER — Telehealth: Payer: Self-pay

## 2022-09-25 NOTE — Telephone Encounter (Signed)
Spoke to pt and went over message in detail per Claris Che  n the absence of severe symptoms, comorbidities including lung disease, kidney disease, immunocompromise, I would not recommend taking antiviral.    She may take mucinex DM if needed   Please advise patient to go to urgent care today or over the weekend if symptoms were to progress as if she wanted an antiviral at any point, medication would need to be started within 5 days of symptom onset. Pt verbalized understanding

## 2022-09-25 NOTE — Telephone Encounter (Signed)
Call patient In the absence of severe symptoms, comorbidities including lung disease, kidney disease, immunocompromise, I would not recommend taking antiviral.   She may take mucinex DM if needed  Please advise patient to go to urgent care today or over the weekend if symptoms were to progress as if she wanted an antiviral at any point, medication would need to be started within 5 days of symptom onset.

## 2022-09-25 NOTE — Telephone Encounter (Signed)
Patient states her covid symptoms started 09/22/2022 at 1am.  Patient states she tested on 09/24/2022 and it was positive for covid.  Patient states she is feeling better now. Patient states she would like to know if Dr. Duncan Dull recommends she take any special medication for this.  I let patient know that Dr. Darrick Huntsman is not in the office today, but someone else will be able to help her.  Patient was offered an appointment with [place] today but declined.

## 2022-09-28 ENCOUNTER — Other Ambulatory Visit: Payer: Self-pay

## 2022-10-29 ENCOUNTER — Encounter: Payer: Self-pay | Admitting: Internal Medicine

## 2022-10-29 DIAGNOSIS — E538 Deficiency of other specified B group vitamins: Secondary | ICD-10-CM

## 2022-10-29 DIAGNOSIS — E782 Mixed hyperlipidemia: Secondary | ICD-10-CM

## 2022-10-29 DIAGNOSIS — E559 Vitamin D deficiency, unspecified: Secondary | ICD-10-CM

## 2022-10-29 DIAGNOSIS — Z8639 Personal history of other endocrine, nutritional and metabolic disease: Secondary | ICD-10-CM

## 2022-11-05 ENCOUNTER — Other Ambulatory Visit: Payer: Self-pay | Admitting: Internal Medicine

## 2022-11-05 ENCOUNTER — Other Ambulatory Visit: Payer: Self-pay

## 2022-11-06 ENCOUNTER — Other Ambulatory Visit: Payer: Self-pay

## 2022-11-06 MED FILL — Meloxicam Tab 15 MG: ORAL | 90 days supply | Qty: 90 | Fill #0 | Status: AC

## 2022-11-09 ENCOUNTER — Ambulatory Visit: Payer: 59 | Admitting: Family Medicine

## 2022-11-10 ENCOUNTER — Other Ambulatory Visit: Payer: Self-pay

## 2022-11-10 ENCOUNTER — Encounter: Payer: Self-pay | Admitting: Nurse Practitioner

## 2022-11-10 ENCOUNTER — Ambulatory Visit (INDEPENDENT_AMBULATORY_CARE_PROVIDER_SITE_OTHER): Payer: 59 | Admitting: Nurse Practitioner

## 2022-11-10 VITALS — BP 110/60 | HR 69 | Temp 98.1°F | Ht 64.0 in | Wt 133.4 lb

## 2022-11-10 DIAGNOSIS — M5431 Sciatica, right side: Secondary | ICD-10-CM

## 2022-11-10 MED ORDER — METHYLPREDNISOLONE 4 MG PO TBPK
ORAL_TABLET | ORAL | 0 refills | Status: DC
  Filled 2022-11-10: qty 21, 6d supply, fill #0

## 2022-11-10 MED ORDER — BACLOFEN 10 MG PO TABS
10.0000 mg | ORAL_TABLET | Freq: Three times a day (TID) | ORAL | 0 refills | Status: DC | PRN
  Filled 2022-11-10: qty 30, 10d supply, fill #0

## 2022-11-10 NOTE — Progress Notes (Signed)
Bethanie Dicker, NP-C Phone: [phone]  Heather Watson is a 67 y.o. female who presents today for sciatica.   Patient with right sided sciatic pain x 11 days. She has had episodes of sciatica before but reports this is the longest she has had it. It starts in her right hip and buttock and travels down her leg. Her pain is worse with laying down and when going from sitting to standing. Her pain is better with movement, walking and stretching. She has been taking Meloxicam 15 mg daily. Denies bowel or bladder incontinence. Denies back pain.   Social History   Tobacco Use  Smoking Status Former   Current packs/day: 0.00   Types: Cigarettes   Quit date: 08/21/1981   Years since quitting: 41.2  Smokeless Tobacco Never    Current Outpatient Medications on File Prior to Visit  Medication Sig Dispense Refill   ALPRAZolam (XANAX) 0.25 MG tablet Take 1 tablet (0.25 mg total) by mouth 2 (two) times daily as needed for anxiety. (Patient taking differently: Take 0.25 mg by mouth 2 (two) times daily as needed for anxiety. (Rare)) 30 tablet 0   Ascorbic Acid (VITAMIN C PO) Take by mouth daily.     Boswellia-Glucosamine-Vit D (OSTEO BI-FLEX ONE PER DAY PO) Take by mouth daily.     Cholecalciferol (VITAMIN D-3) 125 MCG (5000 UT) TABS Take by mouth daily.     diclofenac Sodium (VOLTAREN) 1 % GEL Apply 2 g topically 4 (four) times daily. 350 g 2   ELDERBERRY PO Take by mouth daily.     estradiol (ESTRACE) 0.1 MG/GM vaginal cream Place 1 Applicatorful vaginally at bedtime. FOR 14 DAYS, then twice weekly thereafter 42.5 g 12   magnesium gluconate (MAGONATE) 500 MG tablet Take 800 mg by mouth daily.     meloxicam (MOBIC) 15 MG tablet Take one tablet by mouth daily 90 tablet 1   Omega-3 1000 MG CAPS Take 1 capsule by mouth daily.     No current facility-administered medications on file prior to visit.     ROS see history of present illness  Objective  Physical Exam Vitals:   11/10/22 1456  BP:  110/60  Pulse: 69  Temp: 98.1 F (36.7 C)  SpO2: 99%    BP Readings from Last 3 Encounters:  11/10/22 110/60  08/28/22 116/72  02/03/22 118/70   Wt Readings from Last 3 Encounters:  11/10/22 133 lb 6.4 oz (60.5 kg)  08/28/22 130 lb 6.4 oz (59.1 kg)  02/03/22 131 lb 6.4 oz (59.6 kg)    Physical Exam Constitutional:      General: She is not in acute distress.    Appearance: Normal appearance.  HENT:     Head: Normocephalic.  Cardiovascular:     Rate and Rhythm: Normal rate and regular rhythm.     Heart sounds: Normal heart sounds.  Pulmonary:     Effort: Pulmonary effort is normal.     Breath sounds: Normal breath sounds.  Musculoskeletal:     Lumbar back: Normal. No tenderness. Normal range of motion.     Right hip: Normal. No tenderness. Normal range of motion.  Skin:    General: Skin is warm and dry.  Neurological:     General: No focal deficit present.     Mental Status: She is alert.  Psychiatric:        Mood and Affect: Mood normal.        Behavior: Behavior normal.    Assessment/Plan: Please see individual  problem list.  Sciatica, right side Assessment & Plan: Will treat with MDP and Baclofen. Counseled on common side effects. Patient politely declined imaging at this time. She will return if her symptoms do not resolve. Discussed physical therapy if her pain persists. She can continue her Meloxicam daily. Encouraged to continue stretching and alternating ice and heat on painful area, information provided to patient.   Orders: -     methylPREDNISolone; Take as directed. Take 6 tablets by mouth once on Day 1. Then decrease by 1 tablet daily until complete.  Dispense: 21 each; Refill: 0 -     Baclofen; Take 1 tablet (10 mg total) by mouth 3 (three) times daily as needed.  Dispense: 30 each; Refill: 0   Return if symptoms worsen or fail to improve.   Bethanie Dicker, NP-C [facility] [employer]

## 2022-11-16 NOTE — Assessment & Plan Note (Signed)
Will treat with MDP and Baclofen. Counseled on common side effects. Patient politely declined imaging at this time. She will return if her symptoms do not resolve. Discussed physical therapy if her pain persists. She can continue her Meloxicam daily. Encouraged to continue stretching and alternating ice and heat on painful area, information provided to patient.

## 2022-11-23 ENCOUNTER — Encounter: Payer: Self-pay | Admitting: Nurse Practitioner

## 2022-11-24 ENCOUNTER — Other Ambulatory Visit: Payer: Self-pay | Admitting: Nurse Practitioner

## 2022-11-24 DIAGNOSIS — M5431 Sciatica, right side: Secondary | ICD-10-CM

## 2022-11-25 ENCOUNTER — Ambulatory Visit
Admission: RE | Admit: 2022-11-25 | Discharge: 2022-11-25 | Disposition: A | Discharge status: Home / Self Care | Payer: 59 | Source: Ambulatory Visit | Attending: Nurse Practitioner | Admitting: Nurse Practitioner

## 2022-11-25 ENCOUNTER — Encounter: Payer: Self-pay | Admitting: Internal Medicine

## 2022-11-25 DIAGNOSIS — M79651 Pain in right thigh: Secondary | ICD-10-CM | POA: Diagnosis not present

## 2022-11-25 DIAGNOSIS — M5431 Sciatica, right side: Secondary | ICD-10-CM

## 2022-12-01 ENCOUNTER — Other Ambulatory Visit: Payer: 59

## 2022-12-04 ENCOUNTER — Ambulatory Visit
Admission: RE | Admit: 2022-12-04 | Discharge: 2022-12-04 | Disposition: A | Discharge status: Home / Self Care | Payer: 59 | Source: Ambulatory Visit | Attending: Internal Medicine | Admitting: Internal Medicine

## 2022-12-04 DIAGNOSIS — Z1239 Encounter for other screening for malignant neoplasm of breast: Secondary | ICD-10-CM

## 2022-12-04 DIAGNOSIS — Z803 Family history of malignant neoplasm of breast: Secondary | ICD-10-CM | POA: Diagnosis not present

## 2022-12-04 MED ORDER — GADOPICLENOL 0.5 MMOL/ML IV SOLN
5.0000 mL | Freq: Once | INTRAVENOUS | Status: AC | PRN
  Administered 2022-12-04: 5 mL via INTRAVENOUS

## 2022-12-07 ENCOUNTER — Encounter: Payer: Self-pay | Admitting: Nurse Practitioner

## 2022-12-11 ENCOUNTER — Encounter: Payer: Self-pay | Admitting: Nurse Practitioner

## 2022-12-14 ENCOUNTER — Encounter: Payer: Self-pay | Admitting: Nurse Practitioner

## 2022-12-15 ENCOUNTER — Other Ambulatory Visit: Payer: Self-pay | Admitting: Nurse Practitioner

## 2022-12-15 DIAGNOSIS — M5431 Sciatica, right side: Secondary | ICD-10-CM

## 2022-12-15 DIAGNOSIS — M545 Low back pain, unspecified: Secondary | ICD-10-CM

## 2022-12-21 ENCOUNTER — Ambulatory Visit: Payer: 59 | Attending: Nurse Practitioner

## 2022-12-21 DIAGNOSIS — M5431 Sciatica, right side: Secondary | ICD-10-CM | POA: Diagnosis not present

## 2022-12-21 DIAGNOSIS — M5416 Radiculopathy, lumbar region: Secondary | ICD-10-CM | POA: Insufficient documentation

## 2022-12-21 NOTE — Therapy (Signed)
OUTPATIENT PHYSICAL THERAPY THORACOLUMBAR EVALUATION   Patient Name: Heather Watson MRN: [id number] DOB:Sep 29, 1955, 67 y.o., female Today's Date: 12/21/2022  END OF SESSION:  PT End of Session - 12/21/22 1519     Visit Number 1    Number of Visits 4    Date for PT Re-Evaluation 01/15/23    PT Start Time 1520    PT Stop Time 1637    PT Time Calculation (min) 77 min    Activity Tolerance Patient tolerated treatment well    Behavior During Therapy WFL for tasks assessed/performed             Past Medical History:  Diagnosis Date   Arthritis    Right knee   Family history of breast cancer    Gallstones    UTI (urinary tract infection)    Past Surgical History:  Procedure Laterality Date   BROW LIFT Bilateral 06/06/2021   Procedure: BLEPHAROPLASTY UPPER EYELID; W/ EXCESS SKIN BLEPHAROPTOSIS REPAIR; RESECT EX BILATERAL;  Surgeon: Imagene Riches, MD;  Location: [facility];  Service: Ophthalmology;  Laterality: Bilateral;  wants early arrival   COLONOSCOPY WITH PROPOFOL N/A 05/19/2016   Procedure: COLONOSCOPY WITH PROPOFOL;  Surgeon: Midge Minium, MD;  Location: [facility];  Service: Endoscopy;  Laterality: N/A;   COLONOSCOPY WITH PROPOFOL N/A 07/08/2021   Procedure: COLONOSCOPY WITH PROPOFOL;  Surgeon: Midge Minium, MD;  Location: [facility];  Service: Endoscopy;  Laterality: N/A;  1ST CASE< PLEASE   Patient Active Problem List   Diagnosis Date Noted   Sciatica, right side 11/10/2022   Cough 09/13/2022   Special screening for malignant neoplasms, colon    Genetic testing 04/03/2020   Family history of breast cancer    Lumbar back pain 12/15/2019   ASCUS with positive high risk HPV cervical 01/29/2019   Vitamin D deficiency 01/17/2017   Colon cancer screening    Family history of colonic polyps 01/19/2016   History of meniscal tear 02/02/2014   Postmenopausal atrophic vaginitis 01/08/2013   Encounter for preventive health examination 01/08/2013    Menopause 05/14/2012   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   H/O thyroid cyst 05/14/2012    PCP: Sherlene Shams, MD  REFERRING PROVIDER: Bethanie Dicker, NP  REFERRING DIAG: M54.31 (ICD-10-CM) - Sciatica, right side M54.50 (ICD-10-CM) - Lumbar back pain  Rationale for Evaluation and Treatment: Rehabilitation  THERAPY DIAG:  Radiculopathy, lumbar region - Plan: PT plan of care cert/re-cert  Sciatica, right side - Plan: PT plan of care cert/re-cert  ONSET DATE: 5 weeks ago  SUBJECTIVE:  SUBJECTIVE STATEMENT: Low back pain with R sciatca. Pt states not having low back pain. Has R L 5 dermatome symptoms which started about 5 weeks ago, gradual onset, unknown mechanism of injury. Has had R LE pain 5 times in her life. Usually gone after 4 days. Pain was bad for about 2 weeks when recent pain began. Pain is usually at night: Pt sleeps on her R side, then on her back, then on her L side, then on her stomach. Pt also has pain R anterior ankle joint. Pt also feels R lateral foot pain. Pt also felt tingling in her 2nd and 3rd toes. Pt currently has tingling at the dorsal and plantar surface of her R foot. The pain in R L5 dermatome is gone, now pain is just mostly on her R dorsal and plantar foot. Exercises 4-5 times a week.   R LE 0/10 currently, 2/10 at most for the past 3 weeks. Just worried about the tingling in her foot. No numbness.    Wants to come only 2-3 sessions then continue with her HEP.    PERTINENT HISTORY:  M54.31 (ICD-10-CM) - Sciatica, right side M54.50 (ICD-10-CM) - Lumbar back pain   Low back pain with R sciatca. Pt states not having low back pain. Has R L 5 dermatome symptoms which started about 5 weeks ago, gradual onset, unknown mechanism of injury. Has had R LE pain 5 times in her  life. Usually gone after 4 days. Pain was bad for about 2 weeks when recent pain begain. Pain is usually at night: Pt sleeps on her R side, then on her back, then on her L side, then on her stomach. Pt also has pain R anterior ankle joint. Pt also feels R lateral foot pain. Pt also felt tingling in her 2nd and 3rd toes. Pt currently has tingling at the dorsal and plantar surface of her R foot. The pain in R L5 dermatome is gone, now pain is just mostly on her R dorsal and plantar foot. Exercises 4-5 times a week.   R LE 0/10 currently, 2/10 at most for the past 3 weeks. Just worried about the tingling in her foot. No numbness.   Pt states having a R medial meniscal tear.   Wants to come only 2-3 sessions then continue with her HEP.     No blood pressure problems per pt.  No latex allergies  PAIN:  Are you having pain? Yes: NPRS scale: 0/10 Pain location: R L5 dermatome to foot; dorsal and plantar foot Pain description: tingling, shooting Aggravating factors: night time. Standing up after sitting or laying down for a while, first thing in the morning, abdominal workout (crunches with legs straight as well as lower trunk rotation to the L (R lumbar rotation) increases tingling) .  Relieving factors: movement.   PRECAUTIONS: None  RED FLAGS: Bowel or bladder incontinence: No and Cauda equina syndrome: No   WEIGHT BEARING RESTRICTIONS: No  FALLS:  Has patient fallen in last 6 months? No  LIVING ENVIRONMENT: Lives with: lives alone Lives in: House/apartment Stairs: Yes: Internal: 12 steps; on left going up and External: 3 steps; none Has following equipment at home: None  OCCUPATION: Translator for [employer]  PLOF: Independent  PATIENT GOALS: Get rid of the tingling and the pain.   NEXT MD VISIT: None   OBJECTIVE:  Note: Objective measures were completed at Evaluation unless otherwise noted.  DIAGNOSTIC FINDINGS:   DG Lumbar Spine 2-3 Views  11/25/2022   Narrative &  Impression  CLINICAL DATA:  RIGHT thigh pain at night   EXAM: LUMBAR SPINE - 2-3 VIEW   COMPARISON:  Radiograph 12/15/2019   FINDINGS: Normal alignment lumbar vertebral bodies. There is disc space narrowing and sclerosis at L5-S1. Minimal endplate spurring at L3-L4.   SI joints appear normal.   IMPRESSION: No acute osseous findings.     Electronically Signed   By: Genevive Bi M.D.   On: 12/11/2022 16:50      PATIENT SURVEYS:  FOTO Lumbar spine FOTO 72   COGNITION: Overall cognitive status: Within functional limits for tasks assessed       POSTURE:  forward neck, R shoulder higher, L lateral shift posture, R greater trochanter and knee higher, R foot pronation     No R foot tingling with L lateral shift manual correction from PT    PALPATION: Slight R lumbar paraspinal muscle tension > L  LUMBAR ROM:   AROM eval  Flexion full  Extension WFL with R L5/L1 dermatome symptoms  Right lateral flexion WFL with R lateral foot (L5 dermatome symptoms)  Left lateral flexion WFL  Right rotation [facility] with R lateral foot symptoms (L5)  Left rotation WFL with slight R L5 symptoms in foot.    (Blank rows = not tested)  LOWER EXTREMITY ROM:     Passive  Right eval Left eval  Hip flexion    Hip extension    Hip abduction    Hip adduction    Hip internal rotation    Hip external rotation    Knee flexion    Knee extension    Ankle dorsiflexion    Ankle plantarflexion    Ankle inversion    Ankle eversion     (Blank rows = not tested)  LOWER EXTREMITY MMT:    MMT Right eval Left eval  Hip flexion 4 4-  Hip extension 4 (with R foot tingling initially) 4-  Hip abduction 4- with R lateral leg and anterior lateral foot pain L5 dermatome); R hip hike compensation  4-  Hip adduction    Hip internal rotation    Hip external rotation    Knee flexion 5 5  Knee extension 5 5  Ankle dorsiflexion    Ankle plantarflexion    Ankle inversion    Ankle  eversion     (Blank rows = not tested)  LUMBAR SPECIAL TESTS:  (+) Slump R LE with slight tingling R L 4 dermatome at foot.   (+) Eli test R and L   FUNCTIONAL TESTS:    GAIT: Distance walked: 60 ft Assistive device utilized: None Level of assistance: Complete Independence Comments: L pelvic drop with R lumbar side bend compensation during R LE stance phase  TODAY'S TREATMENT:                                                                                                                              DATE: 12/21/2022  Therapeutic exercise  Standing L lateral shift correction 10x5 seconds   Hooklying posterior pelvic tilt 10x10 seconds  Hooklying posterior pelvic tilt with march 10x each LE   Decreased trunk control observed resulting in pelvic movement and extension   Reviewed HEP. Pt demonstrated and verbalized understanding. Handout provided.    Improved exercise technique, movement at target joints, use of target muscles after mod verbal, visual, tactile cues.       PATIENT EDUCATION:  Education details: there-ex, HEP, POC Person educated: Patient Education method: Explanation, Demonstration, Tactile cues, Verbal cues, and Handouts Education comprehension: verbalized understanding and returned demonstration  HOME EXERCISE PROGRAM: Access Code: [email or web address] URL: [email or web address] Date: 12/21/2022 Prepared by: Loralyn Freshwater  Exercises - Supine Posterior Pelvic Tilt  - 3 x daily - 7 x weekly - 3 sets - 10 reps - 10 seconds hold - Supine March with Posterior Pelvic Tilt  - 3 x daily - 7 x weekly - 3 sets - 10 reps  ASSESSMENT:  CLINICAL IMPRESSION: Patient is a 67 y.o. female who was seen today for physical therapy evaluation and treatment for R LE symptoms. She also presents with altered gait pattern and posture, R LE symptoms predominantly at the R L5/S1 dermatome with L4 symptoms at foot, decreased trunk and lumbopelvic control, decreased trunk  and bilateral glute strength, flexion directional preference, and difficulty tolerating standing and sleeping positions secondary to R LE pain and paresthesia. Pt will benefit from skilled physical therapy services to address the aforementioned deficits.   OBJECTIVE IMPAIRMENTS: decreased strength, improper body mechanics, postural dysfunction, and pain.   ACTIVITY LIMITATIONS: standing and sleeping  PARTICIPATION LIMITATIONS:   PERSONAL FACTORS: Age and Profession are also affecting patient's functional outcome.   REHAB POTENTIAL: Fair    CLINICAL DECISION MAKING: Stable/uncomplicated  EVALUATION COMPLEXITY: Low   GOALS: Goals reviewed with patient? Yes  SHORT TERM GOALS: Target date: 01/08/2023  Pt will be independent with her initial HEP to decrease R LE pain and paresthesia, improve ability to stand and tolerate sleeping positions more comfortably.  Baseline:Pt has started her initial HEP (12/21/2022) Goal status: INITIAL  LONG TERM GOALS: Target date: 01/15/2023  Pt will have a decrease in R LE pain to 0/10 at most to promote ability to stand and sleep more comfortably.  Baseline: 2/10 at most for the past 3 weeks (12/21/2022) Goal status: INITIAL  2.  Pt will improve B hip extension and abduction strength by at least 1/2 MMT grade to promote ability to tolerate standing more comfortably for her R LE.  Baseline:  MMT Right eval Left eval  Hip extension 4 (with R foot tingling initially) 4-  Hip abduction 4- with R lateral leg and anterior lateral foot pain L5 dermatome); R hip hike compensation  4-   Goal status: INITIAL  3.  Pt will improve her lumbar spine FOTO by at least 10 points as a demonstration of improved function.  Baseline: Lumbar spine FOTO 72 (12/21/2022) Goal status: INITIAL   PLAN:  PT FREQUENCY: 1-2x/week  PT DURATION: 3 weeks  PLANNED INTERVENTIONS: 97110-Therapeutic exercises, 97530- Therapeutic activity, [id number]- Neuromuscular re-education,  97140- Manual therapy, [id number]- Aquatic Therapy, 97014- Electrical stimulation (unattended), [id number]- Traction (mechanical), 09811- Ionotophoresis 4mg /ml Dexamethasone, Patient/Family education, Dry Needling, Joint mobilization, and Spinal mobilization.  PLAN FOR NEXT SESSION: posture, trunk and glute strengthening, lumbopelvic control, manual techniques, modalities PRN   Zilah Villaflor, PT, DPT 12/21/2022, 5:06 PM

## 2022-12-24 NOTE — Therapy (Signed)
OUTPATIENT PHYSICAL THERAPY THORACOLUMBAR TREATMENT   Patient Name: Heather Watson MRN: [id number] DOB:03/16/1955, 67 y.o., female Today's Date: 12/28/2022  END OF SESSION:  PT End of Session - 12/28/22 0714     Visit Number 2    Number of Visits 4    Date for PT Re-Evaluation 01/15/23    PT Start Time 0715    PT Stop Time 0759    PT Time Calculation (min) 44 min    Activity Tolerance Patient tolerated treatment well    Behavior During Therapy [facility] for tasks assessed/performed              Past Medical History:  Diagnosis Date   Arthritis    Right knee   Family history of breast cancer    Gallstones    UTI (urinary tract infection)    Past Surgical History:  Procedure Laterality Date   BROW LIFT Bilateral 06/06/2021   Procedure: BLEPHAROPLASTY UPPER EYELID; W/ EXCESS SKIN BLEPHAROPTOSIS REPAIR; RESECT EX BILATERAL;  Surgeon: Imagene Riches, MD;  Location: [facility];  Service: Ophthalmology;  Laterality: Bilateral;  wants early arrival   COLONOSCOPY WITH PROPOFOL N/A 05/19/2016   Procedure: COLONOSCOPY WITH PROPOFOL;  Surgeon: Midge Minium, MD;  Location: [facility];  Service: Endoscopy;  Laterality: N/A;   COLONOSCOPY WITH PROPOFOL N/A 07/08/2021   Procedure: COLONOSCOPY WITH PROPOFOL;  Surgeon: Midge Minium, MD;  Location: [facility] END[facility];  Service: Endoscopy;  Laterality: N/A;  1ST CASE< PLEASE   Patient Active Problem List   Diagnosis Date Noted   Sciatica, right side 11/10/2022   Cough 09/13/2022   Special screening for malignant neoplasms, colon    Genetic testing 04/03/2020   Family history of breast cancer    Lumbar back pain 12/15/2019   ASCUS with positive high risk HPV cervical 01/29/2019   Vitamin D deficiency 01/17/2017   Colon cancer screening    Family history of colonic polyps 01/19/2016   History of meniscal tear 02/02/2014   Postmenopausal atrophic vaginitis 01/08/2013   Encounter for preventive health examination 01/08/2013    Menopause 05/14/2012   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   H/O thyroid cyst 05/14/2012    PCP: Sherlene Shams, MD  REFERRING PROVIDER: Bethanie Dicker, NP  REFERRING DIAG: M54.31 (ICD-10-CM) - Sciatica, right side M54.50 (ICD-10-CM) - Lumbar back pain  Rationale for Evaluation and Treatment: Rehabilitation  THERAPY DIAG:  Radiculopathy, lumbar region  Sciatica, right side  ONSET DATE: 5 weeks ago  SUBJECTIVE:  SUBJECTIVE STATEMENT: Patient reports her pain has worsened and now is bilateral. Took 30 minutes to have pain reduction.      PERTINENT HISTORY:  M54.31 (ICD-10-CM) - Sciatica, right side M54.50 (ICD-10-CM) - Lumbar back pain   Low back pain with R sciatca. Pt states not having low back pain. Has R L 5 dermatome symptoms which started about 5 weeks ago, gradual onset, unknown mechanism of injury. Has had R LE pain 5 times in her life. Usually gone after 4 days. Pain was bad for about 2 weeks when recent pain begain. Pain is usually at night: Pt sleeps on her R side, then on her back, then on her L side, then on her stomach. Pt also has pain R anterior ankle joint. Pt also feels R lateral foot pain. Pt also felt tingling in her 2nd and 3rd toes. Pt currently has tingling at the dorsal and plantar surface of her R foot. The pain in R L5 dermatome is gone, now pain is just mostly on her R dorsal and plantar foot. Exercises 4-5 times a week.   R LE 0/10 currently, 2/10 at most for the past 3 weeks. Just worried about the tingling in her foot. No numbness.   Pt states having a R medial meniscal tear.   Wants to come only 2-3 sessions then continue with her HEP.     No blood pressure problems per pt.  No latex allergies  PAIN:  Are you having pain? Yes: NPRS scale: 0/10 Pain  location: R L5 dermatome to foot; dorsal and plantar foot Pain description: tingling, shooting Aggravating factors: night time. Standing up after sitting or laying down for a while, first thing in the morning, abdominal workout (crunches with legs straight as well as lower trunk rotation to the L (R lumbar rotation) increases tingling) .  Relieving factors: movement.   PRECAUTIONS: None  RED FLAGS: Bowel or bladder incontinence: No and Cauda equina syndrome: No   WEIGHT BEARING RESTRICTIONS: No  FALLS:  Has patient fallen in last 6 months? No  LIVING ENVIRONMENT: Lives with: lives alone Lives in: House/apartment Stairs: Yes: Internal: 12 steps; on left going up and External: 3 steps; none Has following equipment at home: None  OCCUPATION: Translator for [employer]  PLOF: Independent  PATIENT GOALS: Get rid of the tingling and the pain.   NEXT MD VISIT: None   OBJECTIVE:  Note: Objective measures were completed at Evaluation unless otherwise noted.  DIAGNOSTIC FINDINGS:   DG Lumbar Spine 2-3 Views  11/25/2022   Narrative & Impression  CLINICAL DATA:  RIGHT thigh pain at night   EXAM: LUMBAR SPINE - 2-3 VIEW   COMPARISON:  Radiograph 12/15/2019   FINDINGS: Normal alignment lumbar vertebral bodies. There is disc space narrowing and sclerosis at L5-S1. Minimal endplate spurring at L3-L4.   SI joints appear normal.   IMPRESSION: No acute osseous findings.     Electronically Signed   By: Genevive Bi M.D.   On: 12/11/2022 16:50      PATIENT SURVEYS:  FOTO Lumbar spine FOTO 72   COGNITION: Overall cognitive status: Within functional limits for tasks assessed       POSTURE:  forward neck, R shoulder higher, L lateral shift posture, R greater trochanter and knee higher, R foot pronation     No R foot tingling with L lateral shift manual correction from PT    PALPATION: Slight R lumbar paraspinal muscle tension > L  LUMBAR ROM:   AROM eval  Flexion full  Extension WFL with R L5/L1 dermatome symptoms  Right lateral flexion WFL with R lateral foot (L5 dermatome symptoms)  Left lateral flexion WFL  Right rotation [facility] with R lateral foot symptoms (L5)  Left rotation WFL with slight R L5 symptoms in foot.    (Blank rows = not tested)  LOWER EXTREMITY ROM:     Passive  Right eval Left eval  Hip flexion    Hip extension    Hip abduction    Hip adduction    Hip internal rotation    Hip external rotation    Knee flexion    Knee extension    Ankle dorsiflexion    Ankle plantarflexion    Ankle inversion    Ankle eversion     (Blank rows = not tested)  LOWER EXTREMITY MMT:    MMT Right eval Left eval  Hip flexion 4 4-  Hip extension 4 (with R foot tingling initially) 4-  Hip abduction 4- with R lateral leg and anterior lateral foot pain L5 dermatome); R hip hike compensation  4-  Hip adduction    Hip internal rotation    Hip external rotation    Knee flexion 5 5  Knee extension 5 5  Ankle dorsiflexion    Ankle plantarflexion    Ankle inversion    Ankle eversion     (Blank rows = not tested)  LUMBAR SPECIAL TESTS:  (+) Slump R LE with slight tingling R L 4 dermatome at foot.   (+) Eli test R and L   FUNCTIONAL TESTS:    GAIT: Distance walked: 60 ft Assistive device utilized: None Level of assistance: Complete Independence Comments: L pelvic drop with R lumbar side bend compensation during R LE stance phase  TODAY'S TREATMENT:                                                                                                                              DATE: 12/21/2022   Therapeutic exercise HEP performance for education and demonstration of understanding.   Exercises - Supine Lower Trunk Rotation  - 1 x daily - 7 x weekly - 2 sets - 10 reps - 5 hold - Supine Posterior Pelvic Tilt  - 1 x daily - 7 x weekly - 2 sets - 10 reps - 5 hold - Supine Hip Adduction Isometric with Ball  - 1 x daily - 7 x  weekly - 2 sets - 10 reps - 5 hold - Bird Dog  - 1 x daily - 7 x weekly - 2 sets - 10 reps - 5 hold - Bent Knee Fallouts  - 1 x daily - 7 x weekly - 2 sets - 10 reps - 5 hold - Half Kneeling Hip Flexor Stretch  - 1 x daily - 7 x weekly - 2 sets - 1 reps - 30 hold - Supine 90/90 Sciatic Nerve Glide with Knee Flexion/Extension  - 1 x  daily - 7 x weekly - 2 sets - 5 reps - 5 hold     Improved exercise technique, movement at target joints, use of target muscles after mod verbal, visual, tactile cues.    Trigger Point Dry Needling (TDN), unbilled Education performed with patient regarding potential benefit of TDN. Reviewed precautions and risks with patient. Reviewed special precautions/risks over lung fields which include pneumothorax. Reviewed signs and symptoms of pneumothorax and advised pt to go to ER immediately if these symptoms develop advise them of dry needling treatment. Extensive time spent with pt to ensure full understanding of TDN risks. Pt provided verbal consent to treatment. TDN performed to  with 0.25 x 40 single needle placements with local twitch response (LTR). Pistoning technique utilized. Improved pain-free motion following intervention. Bilateral lumbar paraspinals x 2 minutes     PATIENT EDUCATION:  Education details: there-ex, HEP, POC Person educated: Patient Education method: Explanation, Demonstration, Tactile cues, Verbal cues, and Handouts Education comprehension: verbalized understanding and returned demonstration  HOME EXERCISE PROGRAM: Access Code: [email or web address] URL: [email or web address] Date: 12/21/2022 Prepared by: Loralyn Freshwater  Exercises - Supine Posterior Pelvic Tilt  - 3 x daily - 7 x weekly - 3 sets - 10 reps - 10 seconds hold - Supine March with Posterior Pelvic Tilt  - 3 x daily - 7 x weekly - 3 sets - 10 reps  Access Code: [email or web address] URL: [email or web address] Date: 12/28/2022 Prepared by: Precious Bard  Exercises -  Supine Lower Trunk Rotation  - 1 x daily - 7 x weekly - 2 sets - 10 reps - 5 hold - Supine Posterior Pelvic Tilt  - 1 x daily - 7 x weekly - 2 sets - 10 reps - 5 hold - Supine Hip Adduction Isometric with Ball  - 1 x daily - 7 x weekly - 2 sets - 10 reps - 5 hold - Bird Dog  - 1 x daily - 7 x weekly - 2 sets - 10 reps - 5 hold - Bent Knee Fallouts  - 1 x daily - 7 x weekly - 2 sets - 10 reps - 5 hold - Half Kneeling Hip Flexor Stretch  - 1 x daily - 7 x weekly - 2 sets - 1 reps - 30 hold - Supine 90/90 Sciatic Nerve Glide with Knee Flexion/Extension  - 1 x daily - 7 x weekly - 2 sets - 5 reps - 5 hold  ASSESSMENT:  CLINICAL IMPRESSION: Patient has new onset of bilateral LE radiating symptoms. Her pain worsens with transfers and is now significantly impairing her life. Education on exercise performance added to POC with patient demonstrating understanding. Will hold on bird dogs and hip flexor stretch until new onset of pain has resolved. Pt will benefit from skilled physical therapy services to address the aforementioned deficits.   OBJECTIVE IMPAIRMENTS: decreased strength, improper body mechanics, postural dysfunction, and pain.   ACTIVITY LIMITATIONS: standing and sleeping  PARTICIPATION LIMITATIONS:   PERSONAL FACTORS: Age and Profession are also affecting patient's functional outcome.   REHAB POTENTIAL: Fair    CLINICAL DECISION MAKING: Stable/uncomplicated  EVALUATION COMPLEXITY: Low   GOALS: Goals reviewed with patient? Yes  SHORT TERM GOALS: Target date: 01/08/2023  Pt will be independent with her initial HEP to decrease R LE pain and paresthesia, improve ability to stand and tolerate sleeping positions more comfortably.  Baseline:Pt has started her initial HEP (12/21/2022) Goal status: INITIAL  LONG TERM GOALS: Target date: 01/15/2023  Pt will have a decrease in R  LE pain to 0/10 at most to promote ability to stand and sleep more comfortably.  Baseline: 2/10 at most  for the past 3 weeks (12/21/2022) Goal status: INITIAL  2.  Pt will improve B hip extension and abduction strength by at least 1/2 MMT grade to promote ability to tolerate standing more comfortably for her R LE.  Baseline:  MMT Right eval Left eval  Hip extension 4 (with R foot tingling initially) 4-  Hip abduction 4- with R lateral leg and anterior lateral foot pain L5 dermatome); R hip hike compensation  4-   Goal status: INITIAL  3.  Pt will improve her lumbar spine FOTO by at least 10 points as a demonstration of improved function.  Baseline: Lumbar spine FOTO 72 (12/21/2022) Goal status: INITIAL   PLAN:  PT FREQUENCY: 1-2x/week  PT DURATION: 3 weeks  PLANNED INTERVENTIONS: 97110-Therapeutic exercises, 97530- Therapeutic activity, [id number]- Neuromuscular re-education, 97140- Manual therapy, [id number]- Aquatic Therapy, 97014- Electrical stimulation (unattended), [id number]- Traction (mechanical), 86578- Ionotophoresis 4mg /ml Dexamethasone, Patient/Family education, Dry Needling, Joint mobilization, and Spinal mobilization.  PLAN FOR NEXT SESSION: posture, trunk and glute strengthening, lumbopelvic control, manual techniques, modalities PRN   Precious Bard, PT, DPT 12/28/2022, 8:20 AM

## 2022-12-28 ENCOUNTER — Ambulatory Visit: Payer: 59

## 2022-12-28 DIAGNOSIS — M5431 Sciatica, right side: Secondary | ICD-10-CM | POA: Diagnosis not present

## 2022-12-28 DIAGNOSIS — M5416 Radiculopathy, lumbar region: Secondary | ICD-10-CM | POA: Diagnosis not present

## 2022-12-29 ENCOUNTER — Encounter: Payer: Self-pay | Admitting: Internal Medicine

## 2022-12-29 DIAGNOSIS — M5432 Sciatica, left side: Secondary | ICD-10-CM | POA: Diagnosis not present

## 2022-12-29 DIAGNOSIS — M5416 Radiculopathy, lumbar region: Secondary | ICD-10-CM | POA: Diagnosis not present

## 2022-12-29 DIAGNOSIS — M9903 Segmental and somatic dysfunction of lumbar region: Secondary | ICD-10-CM | POA: Diagnosis not present

## 2022-12-29 DIAGNOSIS — M9905 Segmental and somatic dysfunction of pelvic region: Secondary | ICD-10-CM | POA: Diagnosis not present

## 2022-12-29 NOTE — Therapy (Signed)
OUTPATIENT PHYSICAL THERAPY THORACOLUMBAR TREATMENT   Patient Name: Heather Watson MRN: [id number] DOB:03-22-1955, 67 y.o., female Today's Date: 12/31/2022  END OF SESSION:  PT End of Session - 12/30/22 1614     Visit Number 3    Number of Visits 4    Date for PT Re-Evaluation 01/15/23    PT Start Time 1615    PT Stop Time 1659    PT Time Calculation (min) 44 min    Activity Tolerance Patient tolerated treatment well    Behavior During Therapy WFL for tasks assessed/performed               Past Medical History:  Diagnosis Date   Arthritis    Right knee   Family history of breast cancer    Gallstones    UTI (urinary tract infection)    Past Surgical History:  Procedure Laterality Date   BROW LIFT Bilateral 06/06/2021   Procedure: BLEPHAROPLASTY UPPER EYELID; W/ EXCESS SKIN BLEPHAROPTOSIS REPAIR; RESECT EX BILATERAL;  Surgeon: Imagene Riches, MD;  Location: [facility];  Service: Ophthalmology;  Laterality: Bilateral;  wants early arrival   COLONOSCOPY WITH PROPOFOL N/A 05/19/2016   Procedure: COLONOSCOPY WITH PROPOFOL;  Surgeon: Midge Minium, MD;  Location: [facility];  Service: Endoscopy;  Laterality: N/A;   COLONOSCOPY WITH PROPOFOL N/A 07/08/2021   Procedure: COLONOSCOPY WITH PROPOFOL;  Surgeon: Midge Minium, MD;  Location: [facility];  Service: Endoscopy;  Laterality: N/A;  1ST CASE< PLEASE   Patient Active Problem List   Diagnosis Date Noted   Sciatica, right side 11/10/2022   Cough 09/13/2022   Special screening for malignant neoplasms, colon    Genetic testing 04/03/2020   Family history of breast cancer    Lumbar back pain 12/15/2019   ASCUS with positive high risk HPV cervical 01/29/2019   Vitamin D deficiency 01/17/2017   Colon cancer screening    Family history of colonic polyps 01/19/2016   History of meniscal tear 02/02/2014   Postmenopausal atrophic vaginitis 01/08/2013   Encounter for preventive health examination 01/08/2013    Menopause 05/14/2012   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   H/O thyroid cyst 05/14/2012    PCP: Sherlene Shams, MD  REFERRING PROVIDER: Bethanie Dicker, NP  REFERRING DIAG: M54.31 (ICD-10-CM) - Sciatica, right side M54.50 (ICD-10-CM) - Lumbar back pain  Rationale for Evaluation and Treatment: Rehabilitation  THERAPY DIAG:  Radiculopathy, lumbar region  Sciatica, right side  ONSET DATE: 5 weeks ago  SUBJECTIVE:  SUBJECTIVE STATEMENT: Patient went to ED today, saw Neurosurgeon, having worsening pain s/p chiro. Was found to have bulging discs; see below for details.      PERTINENT HISTORY:  M54.31 (ICD-10-CM) - Sciatica, right side M54.50 (ICD-10-CM) - Lumbar back pain   Low back pain with R sciatca. Pt states not having low back pain. Has R L 5 dermatome symptoms which started about 5 weeks ago, gradual onset, unknown mechanism of injury. Has had R LE pain 5 times in her life. Usually gone after 4 days. Pain was bad for about 2 weeks when recent pain begain. Pain is usually at night: Pt sleeps on her R side, then on her back, then on her L side, then on her stomach. Pt also has pain R anterior ankle joint. Pt also feels R lateral foot pain. Pt also felt tingling in her 2nd and 3rd toes. Pt currently has tingling at the dorsal and plantar surface of her R foot. The pain in R L5 dermatome is gone, now pain is just mostly on her R dorsal and plantar foot. Exercises 4-5 times a week.   R LE 0/10 currently, 2/10 at most for the past 3 weeks. Just worried about the tingling in her foot. No numbness.   Pt states having a R medial meniscal tear.   Wants to come only 2-3 sessions then continue with her HEP.     No blood pressure problems per pt.  No latex allergies  PAIN:  Are you having pain?  Yes: NPRS scale: 0/10 Pain location: R L5 dermatome to foot; dorsal and plantar foot Pain description: tingling, shooting Aggravating factors: night time. Standing up after sitting or laying down for a while, first thing in the morning, abdominal workout (crunches with legs straight as well as lower trunk rotation to the L (R lumbar rotation) increases tingling) .  Relieving factors: movement.   PRECAUTIONS: None  RED FLAGS: Bowel or bladder incontinence: No and Cauda equina syndrome: No   WEIGHT BEARING RESTRICTIONS: No  FALLS:  Has patient fallen in last 6 months? No  LIVING ENVIRONMENT: Lives with: lives alone Lives in: House/apartment Stairs: Yes: Internal: 12 steps; on left going up and External: 3 steps; none Has following equipment at home: None  OCCUPATION: Translator for [employer]  PLOF: Independent  PATIENT GOALS: Get rid of the tingling and the pain.   NEXT MD VISIT: None   OBJECTIVE:  Note: Objective measures were completed at Evaluation unless otherwise noted.  DIAGNOSTIC FINDINGS:   DG Lumbar Spine 2-3 Views  11/25/2022   Narrative & Impression  CLINICAL DATA:  RIGHT thigh pain at night   EXAM: LUMBAR SPINE - 2-3 VIEW   COMPARISON:  Radiograph 12/15/2019   FINDINGS: Normal alignment lumbar vertebral bodies. There is disc space narrowing and sclerosis at L5-S1. Minimal endplate spurring at L3-L4.   SI joints appear normal.   IMPRESSION: No acute osseous findings.     Electronically Signed   By: Genevive Bi M.D.   On: 12/11/2022 16:50      IMPRESSION: 1. Lumbar spondylosis as outlined within the body of the report and with findings most notably as follows. 2. At L4-L5, there is a disc bulge. Superimposed moderate-sized central/left subarticular disc extrusion. Caudal migration of the left subarticular component of the disc extrusion. Mild-to-moderate facet arthropathy with ligamentum flavum hypertrophy. The disc extrusion contributes to  multifactorial severe left subarticular stenosis, and encroaches upon the descending left L5 nerve root. Also at this level, there  is multifactorial severe right subarticular stenosis (with likely impingement of the descending right L5 nerve root) and moderate-to-severe central canal stenosis. Bilateral neural foraminal narrowing (mild right, mild/moderate left). 3. At L3-L4, there is 4 mm grade 1 anterolisthesis. Multifactorial mild bilateral subarticular and mild-to-moderate central canal narrowing. Mild relative right neural foraminal narrowing. 4. At T12-L1, a broad-based left subarticular/foraminal disc protrusion minimally effaces the ventrolateral thecal sac. It also results in moderate left neural foraminal narrowing, contacting (and mildly deforming) the undersurface of the exiting left T12 nerve root. 5. No more than mild relative spinal canal or neural foraminal narrowing at the remaining lumbar levels. 6. Levocurvature of the lumbar spine. 7. Cholecystolithiasis.  PATIENT SURVEYS:  FOTO Lumbar spine FOTO 72   COGNITION: Overall cognitive status: Within functional limits for tasks assessed       POSTURE:  forward neck, R shoulder higher, L lateral shift posture, R greater trochanter and knee higher, R foot pronation     No R foot tingling with L lateral shift manual correction from PT    PALPATION: Slight R lumbar paraspinal muscle tension > L  LUMBAR ROM:   AROM eval  Flexion full  Extension WFL with R L5/L1 dermatome symptoms  Right lateral flexion WFL with R lateral foot (L5 dermatome symptoms)  Left lateral flexion WFL  Right rotation [facility] with R lateral foot symptoms (L5)  Left rotation WFL with slight R L5 symptoms in foot.    (Blank rows = not tested)  LOWER EXTREMITY ROM:     Passive  Right eval Left eval  Hip flexion    Hip extension    Hip abduction    Hip adduction    Hip internal rotation    Hip external rotation    Knee flexion     Knee extension    Ankle dorsiflexion    Ankle plantarflexion    Ankle inversion    Ankle eversion     (Blank rows = not tested)  LOWER EXTREMITY MMT:    MMT Right eval Left eval  Hip flexion 4 4-  Hip extension 4 (with R foot tingling initially) 4-  Hip abduction 4- with R lateral leg and anterior lateral foot pain L5 dermatome); R hip hike compensation  4-  Hip adduction    Hip internal rotation    Hip external rotation    Knee flexion 5 5  Knee extension 5 5  Ankle dorsiflexion    Ankle plantarflexion    Ankle inversion    Ankle eversion     (Blank rows = not tested)  LUMBAR SPECIAL TESTS:  (+) Slump R LE with slight tingling R L 4 dermatome at foot.   (+) Eli test R and L   FUNCTIONAL TESTS:    GAIT: Distance walked: 60 ft Assistive device utilized: None Level of assistance: Complete Independence Comments: L pelvic drop with R lumbar side bend compensation during R LE stance phase  TODAY'S TREATMENT:  DATE: 12/31/22   Manual : STM to lumbar paraspinals in standing x4 minutes  Therapeutic exercise Gentle breathing for pain reduction and core activation x10 (added to HEP) Gentle TrA activation pressing into swiss ball on table 10x Gentle forward rollout on swiss ball to stretch low back musculature 10x LE sidestep/weight shift 10x LE march 10x each side  Single leg pro stretch 3x30 seconds each LE  E stim: Interferential cross pattern of L upper extremity 1.1 V,  80/150 Hz, carrier frequency Hz x on while doing movements; education on use of E stim   Improved exercise technique, movement at target joints, use of target muscles after mod verbal, visual, tactile cues.      PATIENT EDUCATION:  Education details: there-ex, HEP, POC Person educated: Patient Education method: Explanation, Demonstration, Tactile cues, Verbal cues,  and Handouts Education comprehension: verbalized understanding and returned demonstration  HOME EXERCISE PROGRAM: Access Code: [email or web address] URL: [email or web address] Date: 12/21/2022 Prepared by: Loralyn Freshwater  Exercises - Supine Posterior Pelvic Tilt  - 3 x daily - 7 x weekly - 3 sets - 10 reps - 10 seconds hold - Supine March with Posterior Pelvic Tilt  - 3 x daily - 7 x weekly - 3 sets - 10 reps  Access Code: [email or web address] URL: [email or web address] Date: 12/28/2022 Prepared by: Precious Bard  Exercises - Supine Lower Trunk Rotation  - 1 x daily - 7 x weekly - 2 sets - 10 reps - 5 hold - Supine Posterior Pelvic Tilt  - 1 x daily - 7 x weekly - 2 sets - 10 reps - 5 hold - Supine Hip Adduction Isometric with Ball  - 1 x daily - 7 x weekly - 2 sets - 10 reps - 5 hold - Bird Dog  - 1 x daily - 7 x weekly - 2 sets - 10 reps - 5 hold - Bent Knee Fallouts  - 1 x daily - 7 x weekly - 2 sets - 10 reps - 5 hold - Half Kneeling Hip Flexor Stretch  - 1 x daily - 7 x weekly - 2 sets - 1 reps - 30 hold - Supine 90/90 Sciatic Nerve Glide with Knee Flexion/Extension  - 1 x daily - 7 x weekly - 2 sets - 5 reps - 5 hold  ASSESSMENT:  CLINICAL IMPRESSION: Patient presents with new onset of severe pain in low back s/p going to chiro. She went to ED and neurosurgeon and per patient report is recommended surgery. She is unable to tolerate seated or supine position. She is eager to have pain reduction this session. Pt will benefit from skilled physical therapy services to address the aforementioned deficits.   OBJECTIVE IMPAIRMENTS: decreased strength, improper body mechanics, postural dysfunction, and pain.   ACTIVITY LIMITATIONS: standing and sleeping  PARTICIPATION LIMITATIONS:   PERSONAL FACTORS: Age and Profession are also affecting patient's functional outcome.   REHAB POTENTIAL: Fair    CLINICAL DECISION MAKING: Stable/uncomplicated  EVALUATION COMPLEXITY:  Low   GOALS: Goals reviewed with patient? Yes  SHORT TERM GOALS: Target date: 01/08/2023  Pt will be independent with her initial HEP to decrease R LE pain and paresthesia, improve ability to stand and tolerate sleeping positions more comfortably.  Baseline:Pt has started her initial HEP (12/21/2022) Goal status: INITIAL  LONG TERM GOALS: Target date: 01/15/2023  Pt will have a decrease in R LE pain to 0/10 at most to promote ability to stand and sleep more comfortably.  Baseline: 2/10 at most for the past  3 weeks (12/21/2022) Goal status: INITIAL  2.  Pt will improve B hip extension and abduction strength by at least 1/2 MMT grade to promote ability to tolerate standing more comfortably for her R LE.  Baseline:  MMT Right eval Left eval  Hip extension 4 (with R foot tingling initially) 4-  Hip abduction 4- with R lateral leg and anterior lateral foot pain L5 dermatome); R hip hike compensation  4-   Goal status: INITIAL  3.  Pt will improve her lumbar spine FOTO by at least 10 points as a demonstration of improved function.  Baseline: Lumbar spine FOTO 72 (12/21/2022) Goal status: INITIAL   PLAN:  PT FREQUENCY: 1-2x/week  PT DURATION: 3 weeks  PLANNED INTERVENTIONS: 97110-Therapeutic exercises, 97530- Therapeutic activity, [id number]- Neuromuscular re-education, 97140- Manual therapy, [id number]- Aquatic Therapy, 97014- Electrical stimulation (unattended), [id number]- Traction (mechanical), 14782- Ionotophoresis 4mg /ml Dexamethasone, Patient/Family education, Dry Needling, Joint mobilization, and Spinal mobilization.  PLAN FOR NEXT SESSION: posture, trunk and glute strengthening, lumbopelvic control, manual techniques, modalities PRN   Precious Bard, PT, DPT 12/31/2022, 9:23 AM

## 2022-12-30 ENCOUNTER — Emergency Department: Payer: 59

## 2022-12-30 ENCOUNTER — Emergency Department
Admission: EM | Admit: 2022-12-30 | Discharge: 2022-12-30 | Disposition: A | Discharge status: Home / Self Care | Payer: 59 | Attending: Emergency Medicine | Admitting: Emergency Medicine

## 2022-12-30 ENCOUNTER — Other Ambulatory Visit: Payer: Self-pay

## 2022-12-30 ENCOUNTER — Ambulatory Visit: Payer: 59 | Admitting: Neurosurgery

## 2022-12-30 ENCOUNTER — Encounter: Payer: Self-pay | Admitting: Neurosurgery

## 2022-12-30 ENCOUNTER — Ambulatory Visit: Payer: 59

## 2022-12-30 ENCOUNTER — Encounter: Payer: Self-pay | Admitting: Emergency Medicine

## 2022-12-30 VITALS — Ht 64.0 in | Wt 129.0 lb

## 2022-12-30 DIAGNOSIS — M545 Low back pain, unspecified: Secondary | ICD-10-CM | POA: Diagnosis not present

## 2022-12-30 DIAGNOSIS — M4316 Spondylolisthesis, lumbar region: Secondary | ICD-10-CM | POA: Diagnosis not present

## 2022-12-30 DIAGNOSIS — M5416 Radiculopathy, lumbar region: Secondary | ICD-10-CM | POA: Diagnosis not present

## 2022-12-30 DIAGNOSIS — M5417 Radiculopathy, lumbosacral region: Secondary | ICD-10-CM | POA: Diagnosis not present

## 2022-12-30 DIAGNOSIS — R29898 Other symptoms and signs involving the musculoskeletal system: Secondary | ICD-10-CM

## 2022-12-30 DIAGNOSIS — M51369 Other intervertebral disc degeneration, lumbar region without mention of lumbar back pain or lower extremity pain: Secondary | ICD-10-CM | POA: Diagnosis not present

## 2022-12-30 DIAGNOSIS — M47816 Spondylosis without myelopathy or radiculopathy, lumbar region: Secondary | ICD-10-CM | POA: Diagnosis not present

## 2022-12-30 DIAGNOSIS — M5106 Intervertebral disc disorders with myelopathy, lumbar region: Secondary | ICD-10-CM

## 2022-12-30 DIAGNOSIS — M5431 Sciatica, right side: Secondary | ICD-10-CM | POA: Diagnosis not present

## 2022-12-30 DIAGNOSIS — K8020 Calculus of gallbladder without cholecystitis without obstruction: Secondary | ICD-10-CM | POA: Diagnosis not present

## 2022-12-30 LAB — URINALYSIS, ROUTINE W REFLEX MICROSCOPIC
Bilirubin Urine: NEGATIVE
Glucose, UA: NEGATIVE mg/dL
Ketones, ur: NEGATIVE mg/dL
Leukocytes,Ua: NEGATIVE
Nitrite: POSITIVE — AB
Protein, ur: NEGATIVE mg/dL
Specific Gravity, Urine: 1.013 (ref 1.005–1.030)
pH: 6 (ref 5.0–8.0)

## 2022-12-30 MED ORDER — METHYLPREDNISOLONE 4 MG PO TBPK
ORAL_TABLET | ORAL | 0 refills | Status: DC
  Filled 2022-12-30: qty 21, 6d supply, fill #0

## 2022-12-30 MED ORDER — OXYCODONE HCL 5 MG PO TABS
5.0000 mg | ORAL_TABLET | Freq: Once | ORAL | Status: AC
  Administered 2022-12-30: 5 mg via ORAL
  Filled 2022-12-30: qty 1

## 2022-12-30 NOTE — ED Triage Notes (Signed)
Pt via POV from home. Pt c/o L back pain that radiates down the L leg, states that it got worse after PT on Wednesday. States that she has seen a chiropractor and they relieve the pain temporarily but now the pain has come back worse. States it was extremely painful to sit or bend. States the same has happened before in the R leg a couple months ago. Denies hx of sciatica. Pt is A&Ox4 and NAD

## 2022-12-30 NOTE — Progress Notes (Signed)
Referring Physician:  Sherlene Shams, MD [address] [place],  [place] [address]  Primary Physician:  Sherlene Shams, MD  History of Present Illness: 12/30/2022 Heather Watson is here today with a chief complaint of severe pain going down her left lower extremity.  She has minimal back pain.  She was originally feeling this since around September 2024.  However she has had a worsening of her exacerbation and now severe in her left leg.  She states that this can be 10 out of 10.  He can stop her from standing or sitting.  Can stop her from walking.  It is worse when she tries to get up from a sitting or laying position.  She has not found any significant relief.  She has noticed mild weakness in left lower extremity.  No bowel or bladder dysfunction.  She has been working with physical therapy and chiropractor.  Her last chiropractic manipulation she felt a significant worsening of her left lower extremity pain.  Conservative measures: has seen a chiropractor  Physical therapy: currently participating in at [facility], her initial evaluation was on 12/21/22.  Multimodal medical therapy including regular antiinflammatories: baclofen, meloxicam, medrol dosepak, oxycodone Injections: has not received epidural steroid injections  The symptoms are causing a significant impact on the patient's life.   I have utilized the care everywhere function in epic to review the outside records available from external health systems.  Review of Systems:  A 10 point review of systems is negative, except for the pertinent positives and negatives detailed in the HPI.  Past Medical History: Past Medical History:  Diagnosis Date   Arthritis    Right knee   Family history of breast cancer    Gallstones    UTI (urinary tract infection)     Past Surgical History: Past Surgical History:  Procedure Laterality Date   BROW LIFT Bilateral 06/06/2021   Procedure: BLEPHAROPLASTY UPPER EYELID;  W/ EXCESS SKIN BLEPHAROPTOSIS REPAIR; RESECT EX BILATERAL;  Surgeon: Imagene Riches, MD;  Location: [facility];  Service: Ophthalmology;  Laterality: Bilateral;  wants early arrival   COLONOSCOPY WITH PROPOFOL N/A 05/19/2016   Procedure: COLONOSCOPY WITH PROPOFOL;  Surgeon: Midge Minium, MD;  Location: [facility];  Service: Endoscopy;  Laterality: N/A;   COLONOSCOPY WITH PROPOFOL N/A 07/08/2021   Procedure: COLONOSCOPY WITH PROPOFOL;  Surgeon: Midge Minium, MD;  Location: [facility];  Service: Endoscopy;  Laterality: N/A;  1ST CASE< PLEASE    Allergies: Allergies as of 12/30/2022 - Review Complete 12/30/2022  Allergen Reaction Noted   Iodine Rash and Itching 05/12/2012   Soy allergy Rash 07/14/2018    Medications:  Current Outpatient Medications:    ALPRAZolam (XANAX) 0.25 MG tablet, Take 1 tablet (0.25 mg total) by mouth 2 (two) times daily as needed for anxiety. (Patient taking differently: Take 0.25 mg by mouth 2 (two) times daily as needed for anxiety. (Rare)), Disp: 30 tablet, Rfl: 0   Ascorbic Acid (VITAMIN C PO), Take by mouth daily., Disp: , Rfl:    baclofen (LIORESAL) 10 MG tablet, Take 1 tablet (10 mg total) by mouth 3 (three) times daily as needed., Disp: 30 each, Rfl: 0   Boswellia-Glucosamine-Vit D (OSTEO BI-FLEX ONE PER DAY PO), Take by mouth daily., Disp: , Rfl:    Cholecalciferol (VITAMIN D-3) 125 MCG (5000 UT) TABS, Take by mouth daily., Disp: , Rfl:    diclofenac Sodium (VOLTAREN) 1 % GEL, Apply 2 g topically 4 (four) times  daily., Disp: 350 g, Rfl: 2   ELDERBERRY PO, Take by mouth daily., Disp: , Rfl:    estradiol (ESTRACE) 0.1 MG/GM vaginal cream, Place 1 Applicatorful vaginally at bedtime. FOR 14 DAYS, then twice weekly thereafter, Disp: 42.5 g, Rfl: 12   magnesium gluconate (MAGONATE) 500 MG tablet, Take 800 mg by mouth daily., Disp: , Rfl:    meloxicam (MOBIC) 15 MG tablet, Take one tablet by mouth daily, Disp: 90 tablet, Rfl: 1   methylPREDNISolone  (MEDROL DOSEPAK) 4 MG TBPK tablet, Take as Directed per package instructions., Disp: 21 tablet, Rfl: 0   Omega-3 1000 MG CAPS, Take 1 capsule by mouth daily., Disp: , Rfl:    baclofen (LIORESAL) 10 MG tablet, Take 1 tablet (10 mg total) by mouth 3 (three) times daily for 7 days., Disp: 21 tablet, Rfl: 0   gabapentin (NEURONTIN) 100 MG capsule, Take 1 capsule (100 mg total) by mouth 3 (three) times daily., Disp: 30 capsule, Rfl: 2   oxyCODONE-acetaminophen (PERCOCET) 5-325 MG tablet, Take 1 tablet by mouth every 4 (four) hours as needed for severe pain (pain score 7-10)., Disp: 15 tablet, Rfl: 0  Social History: Social History   Tobacco Use   Smoking status: Former    Current packs/day: 0.00    Types: Cigarettes    Quit date: 08/21/1981    Years since quitting: 41.4   Smokeless tobacco: Never  Vaping Use   Vaping status: Never Used  Substance Use Topics   Alcohol use: Yes    Comment: occasional   Drug use: No    Family Medical History: Family History  Problem Relation Age of Onset   Hypertension Mother    Arthritis Mother        osteoarthritis   Breast cancer Mother 64   Cancer Mother        stomach/intestine   Stroke Father    Hypertension Father    Cancer Sister        lymphoma   Hypertension Sister    Hypertension Brother    Heart attack Maternal Grandfather    Stroke Paternal Grandfather    Hypertension Brother    Breast cancer Maternal Aunt 40   Cancer Paternal Uncle        unk type    Physical Examination: There were no vitals filed for this visit.  General: Patient is in no apparent distress. Attention to examination is appropriate.  Neck:   Supple.  Full range of motion.  Respiratory: Patient is breathing without any difficulty.   NEUROLOGICAL:     Awake, alert, oriented to person, place, and time.  Speech is clear and fluent.   Cranial Nerves: Pupils equal round and reactive to light.  Facial tone is symmetric.  Facial sensation is symmetric.  Shoulder shrug is symmetric. Tongue protrusion is midline.    Strength:  Side Iliopsoas Quads Hamstring PF DF EHL  R 5 5 5 5 5 5   L 5 5 5 5 4 2    Straight leg is positive, decreased medial hamstrings reflex.  B decree in the left lower extremity.     No evidence of dysmetria noted.  Antalgic gait  Imaging: Narrative & Impression  CLINICAL DATA:  Provided history: Lumbar radiculopathy, symptoms for cyst with greater than 6 weeks treatment. Additional history provided: The patient reports left-sided back pain which radiates into left leg.   EXAM: MRI LUMBAR SPINE WITHOUT CONTRAST   TECHNIQUE: Multiplanar, multisequence MR imaging of the lumbar spine was performed. No intravenous  contrast was administered.   COMPARISON:  Lumbar spine radiographs 11/25/2022.   FINDINGS: Segmentation: Correlating with the prior lumbar spine radiographs of 11/25/2022, there are 5 lumbar vertebrae and the caudal most well-formed into the full disc space is designated L5-S1.   Alignment: Levocurvature of the lumbar spine. 4 mm L3-L4 grade 1 anterolisthesis.   Vertebrae: Lumbar vertebral body height is maintained. No significant marrow edema or focal worrisome marrow lesion.   Conus medullaris and cauda equina: Conus extends to the L1-L2 level. No signal abnormality identified within the visualized distal spinal cord.   Paraspinal and other soft tissues: Cholecystolithiasis. No paraspinal mass or collection.   Disc levels:   Multilevel disc degeneration, greatest at T11-T12, L3-L4, L4-L5 and L5-S1 (mild-to-moderate at these levels).   T12-L1: Broad-based T2 hyperintense left subarticular/foraminal disc protrusion. The disc protrusion minimally effaces the left ventral lateral aspect of the thecal sac. The disc protrusion also results in moderate left neural foraminal narrowing, contacting (and mildly deforming) the undersurface of the exiting left T12 nerve root (series 7, image  9).   L1-L2: Tiny left foraminal disc protrusion. Mild facet arthropathy. No significant spinal canal or foraminal stenosis.   L2-L3: Disc bulge. Facet arthropathy (moderate right, mild-to-moderate left) with ligamentum flavum hypertrophy. Mild relative bilateral subarticular and central canal narrowing. No significant foraminal stenosis.   L3-L4: 4 mm grade 1 anterolisthesis. Disc uncovering with disc bulge. Facet arthropathy (moderate right, mild left) with ligamentum flavum hypertrophy. Mild bilateral subarticular narrowing. Mild to moderate central canal stenosis. Mild relative right neural foraminal narrowing.   L4-L5: Disc bulge. Superimposed moderate-sized central/left subarticular disc extrusion. There is caudal migration of the left subarticular component of the disc extrusion to the upper L5 vertebral body level. Mild to moderate facet arthropathy with ligamentum flavum hypertrophy. The disc extrusion contributes to multifactorial severe left subarticular stenosis, and encroaches upon the descending left L5 nerve root (for instance as seen on series 8, images 24 and 25). Also at this level, there is multifactorial severe right subarticular stenosis with likely impingement of the descending right L5 nerve root and moderate-to-severe central canal stenosis. Bilateral neural foraminal narrowing (mild right, mild/moderate left).   L5-S1: Disc bulge with endplate spurring. Mild facet arthropathy. No significant spinal canal stenosis or neural foraminal narrowing.   IMPRESSION: 1. Lumbar spondylosis as outlined within the body of the report and with findings most notably as follows. 2. At L4-L5, there is a disc bulge. Superimposed moderate-sized central/left subarticular disc extrusion. Caudal migration of the left subarticular component of the disc extrusion. Mild-to-moderate facet arthropathy with ligamentum flavum hypertrophy. The disc extrusion contributes to  multifactorial severe left subarticular stenosis, and encroaches upon the descending left L5 nerve root. Also at this level, there is multifactorial severe right subarticular stenosis (with likely impingement of the descending right L5 nerve root) and moderate-to-severe central canal stenosis. Bilateral neural foraminal narrowing (mild right, mild/moderate left). 3. At L3-L4, there is 4 mm grade 1 anterolisthesis. Multifactorial mild bilateral subarticular and mild-to-moderate central canal narrowing. Mild relative right neural foraminal narrowing. 4. At T12-L1, a broad-based left subarticular/foraminal disc protrusion minimally effaces the ventrolateral thecal sac. It also results in moderate left neural foraminal narrowing, contacting (and mildly deforming) the undersurface of the exiting left T12 nerve root. 5. No more than mild relative spinal canal or neural foraminal narrowing at the remaining lumbar levels. 6. Levocurvature of the lumbar spine. 7. Cholecystolithiasis.     Electronically Signed   By: Jackey Loge D.O.  On: 12/30/2022 11:06     I reviewed these images myself and went over them with the patient.  I showed her how there was a disc herniation at the L4-5 level that was causing compression of her L5 nerve root.  This correlated with her loss of sensation as well as her pain and weakness noted in the left lower extremity.  Medical Decision Making/Assessment and Plan: Heather Watson is a pleasant 67 y.o. female with history of left lower extremity pain and weakness.  She has had intermittent pain in her back and left lower extremities in September 2024.  Has had physical therapy and chiropractic care, after her last manipulation had severe pain and weakness in her left lower extremity.  She states that this is gotten significantly worse presented to the emergency department.  MRI demonstrated a 4 5 disc herniation with impingement of the L5 nerve root.  On physical  examination she has a positive straight leg raise, weakness in the L5 myotome including loss of toe extension great toe extension and some weakness in her dorsiflexion.  She has decreased medial hamstrings reflex.  Decree sensation in her L5 distribution.  Given these findings we did offer her surgical decompression given the weakness.  She would like to continue with conservative care so we opted for a Medrol Dosepak with close follow-up.  We did discuss with her that the longer weakness is present the less likely it is to recover.  She understood this risk and would like to try steroids initially.   Thank you for involving me in the care of this patient.    Lovenia Kim MD/MSCR Neurosurgery

## 2022-12-30 NOTE — ED Provider Notes (Signed)
[facility] Provider Note    Event Date/Time   First MD Initiated Contact with Patient 12/30/22 [phone]     (approximate)   History   Back Pain   HPI  Heather Watson is a 67 y.o. female   presents to the ED with complaint of low back pain that started in September without history of injury.  Patient states that pain worsened with PT evaluation.  She currently is taking baclofen and meloxicam without any relief of her pain.  Patient also saw a chiropractor yesterday for an adjustment and states that the pain worsened during the night and she is in excruciating pain today.  There is no history of incontinence of bowel or bladder.  She denies any previous back injury or history of sciatica.  Patient has history of arthritis and UTIs.      Physical Exam   Triage Vital Signs: ED Triage Vitals  Encounter Vitals Group     BP 12/30/22 0857 (!) 160/103     Systolic BP Percentile --      Diastolic BP Percentile --      Pulse Rate 12/30/22 0857 84     Resp 12/30/22 0857 18     Temp 12/30/22 0857 98.2 F (36.8 C)     Temp Source 12/30/22 0857 Oral     SpO2 12/30/22 0857 97 %     Weight 12/30/22 0855 126 lb (57.2 kg)     Height 12/30/22 0855 5\' 4"  (1.626 m)     Head Circumference --      Peak Flow --      Pain Score 12/30/22 0855 10     Pain Loc --      Pain Education --      Exclude from Growth Chart --     Most recent vital signs: Vitals:   12/30/22 0857 12/30/22 0959  BP: (!) 160/103 139/88  Pulse: 84 99  Resp: 18 17  Temp: 98.2 F (36.8 C)   SpO2: 97% 96%     General: Awake, no distress.  Tearful.  Standing in the room.  States that more painful to sit than stand. CV:  Good peripheral perfusion.  Resp:  Normal effort.  Abd:  No distention.  Other:  Lower lumbar tenderness and paravertebral muscles.  Range of motion is guarded with minimal movement increasing her pain.  Unable to assess straight leg raises as patient is unable to sit  without increasing her pain.  Patient is standing in the exam room and is ambulatory without any assistance.   ED Results / Procedures / Treatments   Labs (all labs ordered are listed, but only abnormal results are displayed) Labs Reviewed  URINALYSIS, ROUTINE W REFLEX MICROSCOPIC - Abnormal; Notable for the following components:      Result Value   Color, Urine YELLOW (*)    APPearance HAZY (*)    Hgb urine dipstick SMALL (*)    Nitrite POSITIVE (*)    Bacteria, UA FEW (*)    All other components within normal limits     RADIOLOGY  Multilevel disc degeneration, greatest at T11-T12, L3-L4, L4-L5 and  L5-S1 (mild-to-moderate at these levels).    T12-L1: Broad-based T2 hyperintense left subarticular/foraminal disc  protrusion. The disc protrusion minimally effaces the left ventral  lateral aspect of the thecal sac. The disc protrusion also results  in moderate left neural foraminal narrowing, contacting (and mildly  deforming) the undersurface of the exiting left T12 nerve root  (  series 7, image 9).    L1-L2: Tiny left foraminal disc protrusion. Mild facet arthropathy.  No significant spinal canal or foraminal stenosis.    L2-L3: Disc bulge. Facet arthropathy (moderate right,  mild-to-moderate left) with ligamentum flavum hypertrophy. Mild  relative bilateral subarticular and central canal narrowing. No  significant foraminal stenosis.    L3-L4: 4 mm grade 1 anterolisthesis. Disc uncovering with disc  bulge. Facet arthropathy (moderate right, mild left) with ligamentum  flavum hypertrophy. Mild bilateral subarticular narrowing. Mild to  moderate central canal stenosis. Mild relative right neural  foraminal narrowing.    L4-L5: Disc bulge. Superimposed moderate-sized central/left  subarticular disc extrusion. There is caudal migration of the left  subarticular component of the disc extrusion to the upper L5  vertebral body level. Mild to moderate facet arthropathy with   ligamentum flavum hypertrophy. The disc extrusion contributes to  multifactorial severe left subarticular stenosis, and encroaches  upon the descending left L5 nerve root (for instance as seen on  series 8, images 24 and 25). Also at this level, there is  multifactorial severe right subarticular stenosis with likely  impingement of the descending right L5 nerve root and  moderate-to-severe central canal stenosis. Bilateral neural  foraminal narrowing (mild right, mild/moderate left).    L5-S1: Disc bulge with endplate spurring. Mild facet arthropathy. No  significant spinal canal stenosis or neural foraminal narrowing.    IMPRESSION:  1. Lumbar spondylosis as outlined within the body of the report and  with findings most notably as follows.  2. At L4-L5, there is a disc bulge. Superimposed moderate-sized  central/left subarticular disc extrusion. Caudal migration of the  left subarticular component of the disc extrusion. Mild-to-moderate  facet arthropathy with ligamentum flavum hypertrophy. The disc  extrusion contributes to multifactorial severe left subarticular  stenosis, and encroaches upon the descending left L5 nerve root.  Also at this level, there is multifactorial severe right  subarticular stenosis (with likely impingement of the descending  right L5 nerve root) and moderate-to-severe central canal stenosis.  Bilateral neural foraminal narrowing (mild right, mild/moderate  left).  3. At L3-L4, there is 4 mm grade 1 anterolisthesis. Multifactorial  mild bilateral subarticular and mild-to-moderate central canal  narrowing. Mild relative right neural foraminal narrowing.  4. At T12-L1, a broad-based left subarticular/foraminal disc  protrusion minimally effaces the ventrolateral thecal sac. It also  results in moderate left neural foraminal narrowing, contacting (and  mildly deforming) the undersurface of the exiting left T12 nerve  root.  5. No more than mild relative  spinal canal or neural foraminal  narrowing at the remaining lumbar levels.  6. Levocurvature of the lumbar spine.  7. Cholecystolithiasis.   PROCEDURES:  Critical Care performed:   Procedures   MEDICATIONS ORDERED IN ED: Medications  oxyCODONE (Oxy IR/ROXICODONE) immediate release tablet 5 mg (5 mg Oral Given 12/30/22 0933)     IMPRESSION / MDM / ASSESSMENT AND PLAN / ED COURSE  I reviewed the triage vital signs and the nursing notes.   Differential diagnosis includes, but is not limited to, persistent low back pain unrelieved by current medication, DDD, disc bulge, herniated disc, musculoskeletal strain, cauda equina considered but unlikely.  67 year old female presents to the ED with complaint of lower lumbar pain ongoing since September.  Patient has tried physical therapy and medication without any relief.  Yesterday pain was worsened after seeing a chiropractor.  No symptoms to suggest cauda equina.  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  No history of injury.  Currently she is taking baclofen and meloxicam without any relief.  Oxycodone 5 mg was given to her while in the ED.  After receiving the results of her MRI I secure chat Dr. Marcell Barlow who is on-call for neurosurgery.  Patient will be seeing Dr. Katrinka Blazing today in the office at 1:30 PM.  No prescriptions were sent to the pharmacy as patient will be seen today.      Patient's presentation is most consistent with acute illness / injury with system symptoms.  FINAL CLINICAL IMPRESSION(S) / ED DIAGNOSES   Final diagnoses:  Low back pain with radiation     Rx / DC Orders   ED Discharge Orders     None        Note:  This document was prepared using Dragon voice recognition software and may include unintentional dictation errors.   Tommi Rumps, PA-C 12/30/22 1337    Sharyn Creamer, MD 12/30/22 [phone]

## 2022-12-30 NOTE — ED Notes (Signed)
This RN and pt. Had lengthy discussion about her back pain, possible home interventions, and action of narcotic pain medications.

## 2022-12-30 NOTE — Discharge Instructions (Addendum)
Your have an appointment with Dr. Katrinka Blazing at 1:30pm today.  His office is [facility] yesterday.

## 2022-12-31 ENCOUNTER — Other Ambulatory Visit: Payer: Self-pay

## 2022-12-31 ENCOUNTER — Telehealth: Payer: Self-pay | Admitting: Physician Assistant

## 2022-12-31 MED ORDER — GABAPENTIN 100 MG PO CAPS
100.0000 mg | ORAL_CAPSULE | Freq: Three times a day (TID) | ORAL | 2 refills | Status: DC
  Filled 2022-12-31: qty 30, 10d supply, fill #0

## 2022-12-31 MED ORDER — BACLOFEN 10 MG PO TABS
10.0000 mg | ORAL_TABLET | Freq: Three times a day (TID) | ORAL | 0 refills | Status: DC
  Filled 2022-12-31: qty 21, 7d supply, fill #0

## 2022-12-31 MED ORDER — OXYCODONE-ACETAMINOPHEN 5-325 MG PO TABS
1.0000 | ORAL_TABLET | ORAL | 0 refills | Status: DC | PRN
  Filled 2022-12-31: qty 15, 3d supply, fill #0

## 2022-12-31 NOTE — Telephone Encounter (Cosign Needed)
Patient called.  States that the neurosurgeon told her she would need surgery.  However patient is going to [place] and will be riding in a car with her family member.  Is wondering if we could send in gabapentin and a muscle relaxer.  I was present here yesterday when the patient was seen.  She was in a severe amount of pain.  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  Feel that it will be appropriate for her to have pain medication.  PDMP score was 30.  I did send in a prescription for gabapentin, baclofen, and Percocet.  She is to continue to follow-up with neurosurgery.  Patient is in agreement treatment plan.

## 2022-12-31 NOTE — Telephone Encounter (Signed)
Patient called about her MyChart message. She is leaving town in 2 hours.

## 2023-01-04 ENCOUNTER — Ambulatory Visit: Payer: 59

## 2023-01-04 DIAGNOSIS — M5106 Intervertebral disc disorders with myelopathy, lumbar region: Secondary | ICD-10-CM | POA: Insufficient documentation

## 2023-01-04 DIAGNOSIS — M5417 Radiculopathy, lumbosacral region: Secondary | ICD-10-CM | POA: Insufficient documentation

## 2023-01-04 DIAGNOSIS — R29898 Other symptoms and signs involving the musculoskeletal system: Secondary | ICD-10-CM | POA: Insufficient documentation

## 2023-01-06 ENCOUNTER — Ambulatory Visit: Payer: 59

## 2023-01-06 DIAGNOSIS — M5416 Radiculopathy, lumbar region: Secondary | ICD-10-CM | POA: Diagnosis not present

## 2023-01-06 DIAGNOSIS — M5431 Sciatica, right side: Secondary | ICD-10-CM | POA: Diagnosis not present

## 2023-01-06 NOTE — Therapy (Signed)
OUTPATIENT PHYSICAL THERAPY TREATMENT   Patient Name: Heather Watson MRN: [id number] DOB:May 14, 1955, 67 y.o., female Today's Date: 01/06/2023  END OF SESSION:  PT End of Session - 01/06/23 1500     Visit Number 4    Number of Visits 4    Date for PT Re-Evaluation 01/15/23    PT Start Time 1400    PT Stop Time 1445    PT Time Calculation (min) 45 min    Activity Tolerance Patient tolerated treatment well;No increased pain    Behavior During Therapy WFL for tasks assessed/performed               Past Medical History:  Diagnosis Date   Arthritis    Right knee   Family history of breast cancer    Gallstones    UTI (urinary tract infection)    Past Surgical History:  Procedure Laterality Date   BROW LIFT Bilateral 06/06/2021   Procedure: BLEPHAROPLASTY UPPER EYELID; W/ EXCESS SKIN BLEPHAROPTOSIS REPAIR; RESECT EX BILATERAL;  Surgeon: Imagene Riches, MD;  Location: [facility];  Service: Ophthalmology;  Laterality: Bilateral;  wants early arrival   COLONOSCOPY WITH PROPOFOL N/A 05/19/2016   Procedure: COLONOSCOPY WITH PROPOFOL;  Surgeon: Midge Minium, MD;  Location: [facility] END[facility];  Service: Endoscopy;  Laterality: N/A;   COLONOSCOPY WITH PROPOFOL N/A 07/08/2021   Procedure: COLONOSCOPY WITH PROPOFOL;  Surgeon: Midge Minium, MD;  Location: [facility] ENDOSCOPY;  Service: Endoscopy;  Laterality: N/A;  1ST CASE< PLEASE   Patient Active Problem List   Diagnosis Date Noted   Intervertebral lumbar disc disorder with myelopathy, lumbar region 01/04/2023   Radiculopathy of lumbosacral region 01/04/2023   Weakness of foot, left 01/04/2023   Sciatica, right side 11/10/2022   Cough 09/13/2022   Special screening for malignant neoplasms, colon    Genetic testing 04/03/2020   Family history of breast cancer    Lumbar back pain 12/15/2019   ASCUS with positive high risk HPV cervical 01/29/2019   Vitamin D deficiency 01/17/2017   Colon cancer screening    Family history of  colonic polyps 01/19/2016   History of meniscal tear 02/02/2014   Postmenopausal atrophic vaginitis 01/08/2013   Encounter for preventive health examination 01/08/2013   Menopause 05/14/2012   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   H/O thyroid cyst 05/14/2012    PCP: Sherlene Shams, MD  REFERRING PROVIDER: Bethanie Dicker, NP  REFERRING DIAG: M54.31 (ICD-10-CM) - Sciatica, right side M54.50 (ICD-10-CM) - Lumbar back pain  Rationale for Evaluation and Treatment: Rehabilitation  THERAPY DIAG:  Radiculopathy, lumbar region  Sciatica, right side  ONSET DATE: 5 weeks ago  SUBJECTIVE:  SUBJECTIVE STATEMENT:  Pt reports arriving pain free today, but still having intermittent tingles in Rt foot and numness on left lateral lower leg and weakness on left ankle. She has a lot of questions about self management and FU in futre.   PERTINENT HISTORY:   Low back pain with R sciatca. Pt states not having low back pain. Has R L 5 dermatome symptoms which started about 5 weeks ago, gradual onset, unknown mechanism of injury. Has had R LE pain 5 times in her life. Usually gone after 4 days. Pain was bad for about 2 weeks when recent pain begain. Pain is usually at night: Pt sleeps on her R side, then on her back, then on her L side, then on her stomach. Pt also has pain R anterior ankle joint. Pt also feels R lateral foot pain. Pt also felt tingling in her 2nd and 3rd toes. Pt currently has tingling at the dorsal and plantar surface of her R foot. The pain in R L5 dermatome is gone, now pain is just mostly on her R dorsal and plantar foot. Exercises 4-5 times a week.   PAIN:  Are you having pain? No pain today upon arrival    PRECAUTIONS: None  RED FLAGS: Bowel or bladder incontinence: No and Cauda equina  syndrome: No   WEIGHT BEARING RESTRICTIONS: No  FALLS:  Has patient fallen in last 6 months? 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   NEXT MD VISIT: None   OBJECTIVE:  Note: Objective measures were completed at Evaluation unless otherwise noted.  DIAGNOSTIC FINDINGS:  MRI 12/30/22: IMPRESSION: 1. Lumbar spondylosis as outlined within the body of the report and with findings most notably as follows. 2. At L4-L5, there is a disc bulge. Superimposed moderate-sized central/left subarticular disc extrusion. Caudal migration of the left subarticular component of the disc extrusion. Mild-to-moderate facet arthropathy with ligamentum flavum hypertrophy. The disc extrusion contributes to multifactorial severe left subarticular stenosis, and encroaches upon the descending left L5 nerve root. Also at this level, there is multifactorial severe right subarticular stenosis (with likely impingement of the descending right L5 nerve root) and moderate-to-severe central canal stenosis. Bilateral neural foraminal narrowing (mild right, mild/moderate left). 3. At L3-L4, there is 4 mm grade 1 anterolisthesis. Multifactorial mild bilateral subarticular and mild-to-moderate central canal narrowing. Mild relative right neural foraminal narrowing. 4. At T12-L1, a broad-based left subarticular/foraminal disc protrusion minimally effaces the ventrolateral thecal sac. It also results in moderate left neural foraminal narrowing, contacting (and mildly deforming) the undersurface of the exiting left T12 nerve root. 5. No more than mild relative spinal canal or neural foraminal narrowing at the remaining lumbar levels. 6.  Levocurvature of the lumbar spine. 7. Cholecystolithiasis.  PATIENT SURVEYS:  FOTO Lumbar spine FOTO 72   LUMBAR ROM:   AROM eval  Flexion full  Extension WFL with R L5/L1 dermatome symptoms  Right lateral flexion WFL with R lateral foot (L5 dermatome symptoms)  Left lateral flexion WFL  Right rotation [facility] with R lateral foot symptoms (L5)  Left rotation WFL with slight R L5 symptoms in foot.    (Blank rows = not tested)   LOWER EXTREMITY MMT:    MMT Right eval Left eval  Hip flexion 4 4-  Hip extension 4 (with R foot tingling initially) 4-  Hip abduction 4- with R lateral leg and anterior lateral foot pain L5 dermatome); R hip hike compensation  4-  Hip adduction    Hip internal rotation    Hip external rotation    Knee flexion 5 5  Knee extension 5 5  Ankle dorsiflexion    Ankle plantarflexion    Ankle inversion    Ankle eversion     (Blank rows = not tested)   TODAY'S TREATMENT:                                                                                                                              DATE: 01/06/23  -Hooklying on plinth, heat applied to back, bolster under knees  *progress increase in lower table elevation to increase lumbar traction/decompression position.  -MMT of left ankle PF, DF, IV, EV, hallux extension, hallux flexion (all 5/5, however need to see in weightbearing)  -setup Left TENS unit on Left tibialis anterior unit changed to continuous mode, previously on modulating from low back use.  -hooklying left ankle DF 15x5secH with concurrent tens to a comfortable and strong level  -additional education on details from imagine study, neurosurgical recommendations, explanation of what anterolisthesis is, etc (pt has many questions about these details)  -emphasized no updates to HEP at home, however gave details on how to obtain this setup for decompression at home (floor/couch)  -reviewed performance of nerve glides per pt request   PATIENT  EDUCATION:  Education details: there-ex, HEP, POC Person educated: Patient Education method: Explanation, Demonstration, Tactile cues, Verbal cues, and Handouts Education comprehension: verbalized understanding and returned demonstration  HOME EXERCISE PROGRAM: EVAL: HEP discontinued due to unfavorable symptoms response ----------------------------------------- Access Code: [email or web address] URL: [email or web address] Date: 12/28/2022 Prepared by: Precious Bard  Exercises - Supine Lower Trunk Rotation  - 1 x daily - 7 x weekly - 2 sets - 10 reps - 5 hold - Supine Posterior Pelvic Tilt  - 1 x daily - 7 x weekly - 2 sets - 10 reps - 5 hold - Supine Hip Adduction Isometric with Ball  - 1 x daily - 7 x weekly - 2 sets - 10 reps - 5 hold - Bird Dog  - 1 x daily - 7 x weekly - 2 sets - 10 reps - 5 hold - Bent Knee Fallouts  - 1 x daily - 7 x weekly - 2 sets - 10 reps - 5 hold - Half Kneeling Hip Flexor Stretch  - 1 x daily - 7 x weekly - 2 sets - 1 reps - 30 hold - Supine 90/90 Sciatic Nerve Glide with Knee Flexion/Extension  - 1 x daily - 7 x weekly - 2 sets - 5 reps - 5 hold  ASSESSMENT:  CLINICAL IMPRESSION: Pain improving overall. Pt continues to have intermittent neurological symptoms in BLE, pattern difficult to detect, but worse in  standing during session. Pt continues to have Rt dorsal pedal paresthesia and Left lower leg numbness, weakness. Education on use of TENS for left ankle DF exercises at home. Pt asked ot refrain from yoga stretches at this time (on eof her questions today coming in) and to avoid any unnecessary and/or excessive lumbar extension or rotation. Pt will benefit from skilled physical therapy services to address the aforementioned deficits.   OBJECTIVE IMPAIRMENTS: decreased strength, improper body mechanics, postural dysfunction, and pain.   ACTIVITY LIMITATIONS: standing and sleeping  PARTICIPATION LIMITATIONS:   PERSONAL FACTORS: Age and Profession are  also affecting patient's functional outcome.   REHAB POTENTIAL: Fair    CLINICAL DECISION MAKING: Stable/uncomplicated  EVALUATION COMPLEXITY: Low   GOALS: Goals reviewed with patient? Yes  SHORT TERM GOALS: Target date: 01/08/2023  Pt will be independent with her initial HEP to decrease R LE pain and paresthesia, improve ability to stand and tolerate sleeping positions more comfortably.  Baseline:Pt has started her initial HEP (12/21/2022) Goal status: INITIAL  LONG TERM GOALS: Target date: 01/15/2023  Pt will have a decrease in R LE pain to 0/10 at most to promote ability to stand and sleep more comfortably.  Baseline: 2/10 at most for the past 3 weeks (12/21/2022) Goal status: INITIAL  2.  Pt will improve B hip extension and abduction strength by at least 1/2 MMT grade to promote ability to tolerate standing more comfortably for her R LE.  Baseline:  MMT Right eval Left eval  Hip extension 4 (with R foot tingling initially) 4-  Hip abduction 4- with R lateral leg and anterior lateral foot pain L5 dermatome); R hip hike compensation  4-   Goal status: INITIAL  3.  Pt will improve her lumbar spine FOTO by at least 10 points as a demonstration of improved function.  Baseline: Lumbar spine FOTO 72 (12/21/2022) Goal status: INITIAL   PLAN:  PT FREQUENCY: 1-2x/week  PT DURATION: 3 weeks  PLANNED INTERVENTIONS: 97110-Therapeutic exercises, 97530- Therapeutic activity, [id number]- Neuromuscular re-education, 97140- Manual therapy, [id number]- Aquatic Therapy, 97014- Electrical stimulation (unattended), [id number]- Traction (mechanical), 16109- Ionotophoresis 4mg /ml Dexamethasone, Patient/Family education, Dry Needling, Joint mobilization, and Spinal mobilization.  PLAN FOR NEXT SESSION: *avoid extension/rotation due to active neurologic tissue involvement, recently seen 4mm anterolisthesis  3:13 PM, 01/06/23 Rosamaria Lints, PT, DPT Physical Therapist - [facility]  Outpatient Physical Therapy- [facility] [phone]      Ackley C, PT, DPT 01/06/2023, 3:13 PM

## 2023-01-07 ENCOUNTER — Telehealth: Payer: Self-pay | Admitting: Neurosurgery

## 2023-01-07 NOTE — Telephone Encounter (Signed)
Patient is calling to request an appointment with Dr. Myer Haff. She saw Dr. Katrinka Blazing on 12/30/2022 after being in the ER. She states that she has worked with Dr. 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 She would like to discuss alternative treatment options other than surgery. She states she is not seeking a second opinion, but would rather see a provider she feels comfortable with. Please advise.

## 2023-01-08 NOTE — Telephone Encounter (Signed)
Patient scheduled.

## 2023-01-12 ENCOUNTER — Ambulatory Visit: Payer: 59

## 2023-01-12 DIAGNOSIS — M5416 Radiculopathy, lumbar region: Secondary | ICD-10-CM | POA: Diagnosis not present

## 2023-01-12 DIAGNOSIS — M5431 Sciatica, right side: Secondary | ICD-10-CM | POA: Diagnosis not present

## 2023-01-12 NOTE — Progress Notes (Signed)
Referring Physician:  Sherlene Shams, MD [address] [place],  [place] [address]  Primary Physician:  Sherlene Shams, MD  History of Present Illness: 01/19/2023 Ms. Heather Watson is here today with a chief complaint of known disc herniation at L4-5.  She presented to the emergency department approximately 3 weeks ago with severe pain primarily down her left leg, though she did have some right leg symptoms.  She has not had any back pain.  On initial evaluation by my partner, she had a left foot drop.  She was offered surgical intervention at the time, but preferred to move forward with conservative management.  She started physical therapy.  She has had a good response to physical therapy.  She started on a prednisone taper as well as gabapentin, muscle relaxant, and Tylenol.  Her pain has substantially improved.  She is essentially without pain at this point.  She does still have some diminished sensation in her left greater than right lower legs.  Past Surgery: no prior spinal surgeries   I have utilized the care everywhere function in epic to review the outside records available from external health systems.   Progress Note from Ernestine Mcmurray, MD on 12/30/22:  History of Present Illness: 12/30/2022 Ms. Heather Watson is here today with a chief complaint of severe pain going down her left lower extremity.  She has minimal back pain.  She was originally feeling this since around September 2024.  However she has had a worsening of her exacerbation and now severe in her left leg.  She states that this can be 10 out of 10.  He can stop her from standing or sitting.  Can stop her from walking.  It is worse when she tries to get up from a sitting or laying position.  She has not found any significant relief.  She has noticed mild weakness in left lower extremity.  No bowel or bladder dysfunction.  She has been working with physical therapy and chiropractor.  Her last  chiropractic manipulation she felt a significant worsening of her left lower extremity pain.   Conservative measures: has seen a chiropractor  Physical therapy: currently participating in at [facility], her initial evaluation was on 12/21/22.  Multimodal medical therapy including regular antiinflammatories: baclofen, meloxicam, medrol dosepak, oxycodone Injections: has not received epidural steroid injections  Review of Systems:  A 10 point review of systems is negative, except for the pertinent positives and negatives detailed in the HPI.  Past Medical History: Past Medical History:  Diagnosis Date   Arthritis    Right knee   Family history of breast cancer    Gallstones    UTI (urinary tract infection)     Past Surgical History: Past Surgical History:  Procedure Laterality Date   BROW LIFT Bilateral 06/06/2021   Procedure: BLEPHAROPLASTY UPPER EYELID; W/ EXCESS SKIN BLEPHAROPTOSIS REPAIR; RESECT EX BILATERAL;  Surgeon: Imagene Riches, MD;  Location: [facility];  Service: Ophthalmology;  Laterality: Bilateral;  wants early arrival   COLONOSCOPY WITH PROPOFOL N/A 05/19/2016   Procedure: COLONOSCOPY WITH PROPOFOL;  Surgeon: Midge Minium, MD;  Location: [facility] END[facility]COPY;  Service: Endoscopy;  Laterality: N/A;   COLONOSCOPY WITH PROPOFOL N/A 07/08/2021   Procedure: COLONOSCOPY WITH PROPOFOL;  Surgeon: Midge Minium, MD;  Location: [facility];  Service: Endoscopy;  Laterality: N/A;  1ST CASE< PLEASE    Allergies: Allergies as of 01/19/2023 - Review Complete 01/19/2023  Allergen Reaction Noted   Iodine Rash and  Itching 05/12/2012   Soy allergy Rash 07/14/2018    Medications:  Current Outpatient Medications:    ALPRAZolam (XANAX) 0.25 MG tablet, Take 1 tablet (0.25 mg total) by mouth 2 (two) times daily as needed for anxiety. (Patient taking differently: Take 0.25 mg by mouth 2 (two) times daily as needed for anxiety. (Rare)), Disp: 30 tablet, Rfl: 0   Ascorbic Acid (VITAMIN C PO),  Take by mouth daily., Disp: , Rfl:    Boswellia-Glucosamine-Vit D (OSTEO BI-FLEX ONE PER DAY PO), Take by mouth daily., Disp: , Rfl:    Cholecalciferol (VITAMIN D-3) 125 MCG (5000 UT) TABS, Take by mouth daily., Disp: , Rfl:    diclofenac Sodium (VOLTAREN) 1 % GEL, Apply 2 g topically 4 (four) times daily., Disp: 350 g, Rfl: 2   ELDERBERRY PO, Take by mouth daily., Disp: , Rfl:    estradiol (ESTRACE) 0.1 MG/GM vaginal cream, Place 1 Applicatorful vaginally at bedtime. FOR 14 DAYS, then twice weekly thereafter, Disp: 42.5 g, Rfl: 12   gabapentin (NEURONTIN) 100 MG capsule, Take 1 capsule (100 mg total) by mouth 3 (three) times daily., Disp: 30 capsule, Rfl: 2   magnesium gluconate (MAGONATE) 500 MG tablet, Take 800 mg by mouth daily., Disp: , Rfl:    meloxicam (MOBIC) 15 MG tablet, Take one tablet by mouth daily, Disp: 90 tablet, Rfl: 1   Omega-3 1000 MG CAPS, Take 1 capsule by mouth daily., Disp: , Rfl:   Social History: Social History   Tobacco Use   Smoking status: Former    Current packs/day: 0.00    Types: Cigarettes    Quit date: 08/21/1981    Years since quitting: 41.4   Smokeless tobacco: Never  Vaping Use   Vaping status: Never Used  Substance Use Topics   Alcohol use: Yes    Comment: occasional   Drug use: No    Family Medical History: Family History  Problem Relation Age of Onset   Hypertension Mother    Arthritis Mother        osteoarthritis   Breast cancer Mother 59   Cancer Mother        stomach/intestine   Stroke Father    Hypertension Father    Cancer Sister        lymphoma   Hypertension Sister    Hypertension Brother    Heart attack Maternal Grandfather    Stroke Paternal Grandfather    Hypertension Brother    Breast cancer Maternal Aunt 40   Cancer Paternal Uncle        unk type    Physical Examination: Vitals:   01/19/23 1358  BP: 124/84    General: Patient is in no apparent distress. Attention to examination is appropriate.  Neck:    Supple.  Full range of motion.  Respiratory: Patient is breathing without any difficulty.   NEUROLOGICAL:     Awake, alert, oriented to person, place, and time.  Speech is clear and fluent.   Cranial Nerves: Pupils equal round and reactive to light.  Facial tone is symmetric.  Facial sensation is symmetric. Shoulder shrug is symmetric. Tongue protrusion is midline.  There is no pronator drift.  Strength: Side Biceps Triceps Deltoid Interossei Grip Wrist Ext. Wrist Flex.  R 5 5 5 5 5 5 5   L 5 5 5 5 5 5 5    Side Iliopsoas Quads Hamstring PF DF EHL  R 5 5 5 5 5 5   L 5 5 5 5 4  3  Reflexes are 2+ and symmetric at the biceps, triceps, brachioradialis, 1+ patella and achilles.   Hoffman's is absent.   Bilateral upper and lower extremity sensation is intact to light touch with exception of diminished light touch L lateral calf and top of foot..    No evidence of dysmetria noted.  Gait is normal.  SLR negative bilaterally   Medical Decision Making  Imaging: MRI L spine 12/30/2022 IMPRESSION: 1. Lumbar spondylosis as outlined within the body of the report and with findings most notably as follows. 2. At L4-L5, there is a disc bulge. Superimposed moderate-sized central/left subarticular disc extrusion. Caudal migration of the left subarticular component of the disc extrusion. Mild-to-moderate facet arthropathy with ligamentum flavum hypertrophy. The disc extrusion contributes to multifactorial severe left subarticular stenosis, and encroaches upon the descending left L5 nerve root. Also at this level, there is multifactorial severe right subarticular stenosis (with likely impingement of the descending right L5 nerve root) and moderate-to-severe central canal stenosis. Bilateral neural foraminal narrowing (mild right, mild/moderate left). 3. At L3-L4, there is 4 mm grade 1 anterolisthesis. Multifactorial mild bilateral subarticular and mild-to-moderate central canal narrowing.  Mild relative right neural foraminal narrowing. 4. At T12-L1, a broad-based left subarticular/foraminal disc protrusion minimally effaces the ventrolateral thecal sac. It also results in moderate left neural foraminal narrowing, contacting (and mildly deforming) the undersurface of the exiting left T12 nerve root. 5. No more than mild relative spinal canal or neural foraminal narrowing at the remaining lumbar levels. 6. Levocurvature of the lumbar spine. 7. Cholecystolithiasis.     Electronically Signed   By: Jackey Loge D.O.   On: 12/30/2022 11:06  I have personally reviewed the images and agree with the above interpretation.  Assessment and Plan: Ms. Swearingen is a pleasant 67 y.o. female with left foot drop likely due to L4-5 disc herniation.  Her severe pain is improved.  She is currently doing physical therapy.  At this point, I think it is reasonable to consider physical therapy, though surgery is still an option given her current weakness.  That being said, I think it is reasonable to wait and see whether her motor function will improve.  I reviewed with her that the current data available suggest that motor recovery is independent of surgical intervention if the motor deficit is been present for longer than 48 hours.  As such, surgery at this point is primarily geared towards relief of pain.  Because she does not have a substantial amount of pain at this point, we will continue with conservative management.  I will see her back in clinic in approximately 6 weeks.  If her motor recovery has plateaued or is worse, we will consider surgical intervention.  If she has a return of her pain, she will let me know and we will discuss whether surgical intervention is appropriate.  If her motor function was improving at the time of a pain exacerbation, 1 could also consider an epidural steroid injection.  I reviewed with her that epidural steroid injection is primarily useful to try to control  pain.  It will not have substantial impact on motor recovery.  I will see her back in clinic in approximately 6 weeks.  I spent a total of 45 minutes in this patient's care today. This time was spent reviewing pertinent records including imaging studies, obtaining and confirming history, performing a directed evaluation, formulating and discussing my recommendations, and documenting the visit within the medical record.      Thank you  for involving me in the care of this patient.      Tiyona Desouza K. Myer Haff MD, [facility] Neurosurgery

## 2023-01-12 NOTE — Therapy (Signed)
OUTPATIENT PHYSICAL THERAPY TREATMENT   Patient Name: Heather Watson MRN: [id number] DOB:1956-02-01, 67 y.o., female Today's Date: 01/12/2023  END OF SESSION:  PT End of Session - 01/12/23 1624     Visit Number 5    Number of Visits 12    Date for PT Re-Evaluation 01/15/23    Authorization Type [employer]    PT Start Time 1615    PT Stop Time 1655    PT Time Calculation (min) 40 min    Activity Tolerance Patient tolerated treatment well;No increased pain    Behavior During Therapy WFL for tasks assessed/performed               Past Medical History:  Diagnosis Date   Arthritis    Right knee   Family history of breast cancer    Gallstones    UTI (urinary tract infection)    Past Surgical History:  Procedure Laterality Date   BROW LIFT Bilateral 06/06/2021   Procedure: BLEPHAROPLASTY UPPER EYELID; W/ EXCESS SKIN BLEPHAROPTOSIS REPAIR; RESECT EX BILATERAL;  Surgeon: Imagene Riches, MD;  Location: [facility];  Service: Ophthalmology;  Laterality: Bilateral;  wants early arrival   COLONOSCOPY WITH PROPOFOL N/A 05/19/2016   Procedure: COLONOSCOPY WITH PROPOFOL;  Surgeon: Midge Minium, MD;  Location: [facility] ENDOSCOPY;  Service: Endoscopy;  Laterality: N/A;   COLONOSCOPY WITH PROPOFOL N/A 07/08/2021   Procedure: COLONOSCOPY WITH PROPOFOL;  Surgeon: Midge Minium, MD;  Location: [facility];  Service: Endoscopy;  Laterality: N/A;  1ST CASE< PLEASE   Patient Active Problem List   Diagnosis Date Noted   Intervertebral lumbar disc disorder with myelopathy, lumbar region 01/04/2023   Radiculopathy of lumbosacral region 01/04/2023   Weakness of foot, left 01/04/2023   Sciatica, right side 11/10/2022   Cough 09/13/2022   Special screening for malignant neoplasms, colon    Genetic testing 04/03/2020   Family history of breast cancer    Lumbar back pain 12/15/2019   ASCUS with positive high risk HPV cervical 01/29/2019   Vitamin D deficiency 01/17/2017    Colon cancer screening    Family history of colonic polyps 01/19/2016   History of meniscal tear 02/02/2014   Postmenopausal atrophic vaginitis 01/08/2013   Encounter for preventive health examination 01/08/2013   Menopause 05/14/2012   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   H/O thyroid cyst 05/14/2012    PCP: Sherlene Shams, MD  REFERRING PROVIDER: Bethanie Dicker, NP  REFERRING DIAG: M54.31 (ICD-10-CM) - Sciatica, right side M54.50 (ICD-10-CM) - Lumbar back pain  Rationale for Evaluation and Treatment: Rehabilitation  THERAPY DIAG:  Radiculopathy, lumbar region  Sciatica, right side  ONSET DATE: 5 weeks ago  SUBJECTIVE:  SUBJECTIVE STATEMENT:  Pt still pain free, reports her paresthesia in RLE to be intermittent now, rather than constant. She reports stable numbness on the LLE. Has still been conservative with personal fitness activity as previously requested.   PERTINENT HISTORY:   Low back pain with R sciatca. Pt states not having low back pain. Has R L 5 dermatome symptoms which started about 5 weeks ago, gradual onset, unknown mechanism of injury. Has had R LE pain 5 times in her life. Usually gone after 4 days. Pain was bad for about 2 weeks when recent pain begain. Pain is usually at night: Pt sleeps on her R side, then on her back, then on her L side, then on her stomach. Pt also has pain R anterior ankle joint. Pt also feels R lateral foot pain. Pt also felt tingling in her 2nd and 3rd toes. Pt currently has tingling at the dorsal and plantar surface of her R foot. The pain in R L5 dermatome is gone, now pain is just mostly on her R dorsal and plantar foot. Exercises 4-5 times a week.   PAIN:  Are you having pain? No pain today upon arrival    PRECAUTIONS: None  RED FLAGS: Bowel  or bladder incontinence: No and Cauda equina syndrome: No   WEIGHT BEARING RESTRICTIONS: No  FALLS:  Has patient fallen in last 6 months? 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   NEXT MD VISIT: None   OBJECTIVE:  Note: Objective measures were completed at Evaluation unless otherwise noted.  DIAGNOSTIC FINDINGS:  MRI 12/30/22: IMPRESSION: 1. Lumbar spondylosis as outlined within the body of the report and with findings most notably as follows. 2. At L4-L5, there is a disc bulge. Superimposed moderate-sized central/left subarticular disc extrusion. Caudal migration of the left subarticular component of the disc extrusion. Mild-to-moderate facet arthropathy with ligamentum flavum hypertrophy. The disc extrusion contributes to multifactorial severe left subarticular stenosis, and encroaches upon the descending left L5 nerve root. Also at this level, there is multifactorial severe right subarticular stenosis (with likely impingement of the descending right L5 nerve root) and moderate-to-severe central canal stenosis. Bilateral neural foraminal narrowing (mild right, mild/moderate left). 3. At L3-L4, there is 4 mm grade 1 anterolisthesis. Multifactorial mild bilateral subarticular and mild-to-moderate central canal narrowing. Mild relative right neural foraminal narrowing. 4. At T12-L1, a broad-based left subarticular/foraminal disc protrusion minimally effaces the ventrolateral thecal sac. It also results in moderate left neural foraminal narrowing, contacting (and mildly deforming) the undersurface of the exiting left T12 nerve root. 5. No more than mild relative spinal canal or neural foraminal narrowing  at the remaining lumbar levels. 6. Levocurvature of the lumbar spine. 7. Cholecystolithiasis.  PATIENT SURVEYS:  FOTO Lumbar spine FOTO 72   LUMBAR ROM:   AROM eval  Flexion full  Extension WFL with R L5/L1 dermatome symptoms  Right lateral flexion WFL with R lateral foot (L5 dermatome symptoms)  Left lateral flexion WFL  Right rotation [facility] with R lateral foot symptoms (L5)  Left rotation WFL with slight R L5 symptoms in foot.    (Blank rows = not tested)   LOWER EXTREMITY MMT:    MMT Right eval Left eval  Hip flexion 4 4-  Hip extension 4 (with R foot tingling initially) 4-  Hip abduction 4- with R lateral leg and anterior lateral foot pain L5 dermatome); R hip hike compensation  4-  Hip adduction    Hip internal rotation    Hip external rotation    Knee flexion 5 5  Knee extension 5 5  Ankle dorsiflexion    Ankle plantarflexion    Ankle inversion    Ankle eversion     (Blank rows = not tested)   TODAY'S TREATMENT:                                                                                                                              DATE: 01/12/23  -AA/ROM nerve glides on Nustep, level 2 x 5 minutes, seat 5, arms 7.5 -seated ABD brace marching x20, alternating  -seated seated jefferson curl to 6" high target (yoga block) x15 (cues to allow lumbar flexion to occur during down phase -seated Left ankle DF 3lb AW x15  -seated ABD bracing + chest press x15 c 10lb ball  -seated seated jefferson curl to 4" high target (yoga block) x15 (cues to allow lumbar flexion to occur during down phase) -standing, leaning against wall behind, heels 12 inches from wall: bilat ankle DF 1x15  -seated ABD brace + yellow horABCT resisted BUE flexion 10x10sec    PATIENT EDUCATION:  Education details: there-ex, HEP, POC Person educated: Patient Education method: Explanation, Demonstration, Tactile cues, Verbal cues, and Handouts Education comprehension: verbalized understanding  and returned demonstration  HOME EXERCISE PROGRAM: EVAL: HEP discontinued due to unfavorable symptoms response ----------------------------------------- Access Code: [email or web address] URL: [email or web address] Date: 12/28/2022 Prepared by: Precious Bard  - Supine 90/90 Sciatic Nerve Glide with Knee Flexion/Extension  - 1 x daily - 7 x weekly - 2 sets - 5 reps - 5 hold  Updated 01/12/23:     ASSESSMENT:  CLINICAL IMPRESSION: Pt reports stable and slightly improve symptoms since last visit, able to transition to chair based program. Pt to FU with Dr. Myer Haff in December. Continued with hybrid intervention approach, part core stabilization motor control training and part lumber flexion directional preference program. Pt given udpated HEP to reflect successful and safe activities for home. Pt will benefit from skilled physical therapy services to address the aforementioned deficits.   OBJECTIVE IMPAIRMENTS: decreased strength, improper body mechanics, postural dysfunction, and pain.   ACTIVITY LIMITATIONS: standing and sleeping  PARTICIPATION LIMITATIONS:   PERSONAL FACTORS: Age and Profession are also affecting patient's functional outcome.   REHAB POTENTIAL: Fair    CLINICAL DECISION MAKING: Stable/uncomplicated  EVALUATION COMPLEXITY: Low   GOALS: Goals reviewed with patient? Yes  SHORT TERM GOALS: Target date: 01/08/2023  Pt will be independent with her initial HEP to decrease R LE pain and paresthesia, improve ability to stand and tolerate sleeping positions more comfortably.  Baseline:Pt has started her initial HEP (12/21/2022) Goal status: INITIAL  LONG TERM GOALS: Target date: 01/15/2023  Pt will  have a decrease in R LE pain to 0/10 at most to promote ability to stand and sleep more comfortably.  Baseline: 2/10 at most for the past 3 weeks (12/21/2022) Goal status: INITIAL  2.  Pt will improve B hip extension and abduction strength by at least 1/2 MMT grade  to promote ability to tolerate standing more comfortably for her R LE.  Baseline:  MMT Right eval Left eval  Hip extension 4 (with R foot tingling initially) 4-  Hip abduction 4- with R lateral leg and anterior lateral foot pain L5 dermatome); R hip hike compensation  4-   Goal status: INITIAL  3.  Pt will improve her lumbar spine FOTO by at least 10 points as a demonstration of improved function.  Baseline: Lumbar spine FOTO 72 (12/21/2022) Goal status: INITIAL   PLAN:  PT FREQUENCY: 1-2x/week  PT DURATION: 3 weeks  PLANNED INTERVENTIONS: 97110-Therapeutic exercises, 97530- Therapeutic activity, [id number]- Neuromuscular re-education, 97140- Manual therapy, [id number]- Aquatic Therapy, 97014- Electrical stimulation (unattended), [id number]- Traction (mechanical), 54098- Ionotophoresis 4mg /ml Dexamethasone, Patient/Family education, Dry Needling, Joint mobilization, and Spinal mobilization.  PLAN FOR NEXT SESSION: *avoid extension/rotation due to active neurologic tissue involvement, recently seen 4mm anterolisthesis  4:34 PM, 01/12/23 Rosamaria Lints, PT, DPT Physical Therapist - [facility]  Outpatient Physical Therapy- [facility] [phone]      [place], PT, DPT 01/12/2023, 4:34 PM

## 2023-01-19 ENCOUNTER — Ambulatory Visit: Payer: 59 | Admitting: Neurosurgery

## 2023-01-19 ENCOUNTER — Encounter: Payer: Self-pay | Admitting: Neurosurgery

## 2023-01-19 VITALS — BP 124/84 | Ht 64.0 in | Wt 129.0 lb

## 2023-01-19 DIAGNOSIS — R29898 Other symptoms and signs involving the musculoskeletal system: Secondary | ICD-10-CM | POA: Diagnosis not present

## 2023-01-19 DIAGNOSIS — M5417 Radiculopathy, lumbosacral region: Secondary | ICD-10-CM

## 2023-01-19 DIAGNOSIS — M21372 Foot drop, left foot: Secondary | ICD-10-CM | POA: Diagnosis not present

## 2023-01-20 ENCOUNTER — Ambulatory Visit: Payer: 59 | Attending: Nurse Practitioner

## 2023-01-20 DIAGNOSIS — M5416 Radiculopathy, lumbar region: Secondary | ICD-10-CM | POA: Diagnosis not present

## 2023-01-20 DIAGNOSIS — M6281 Muscle weakness (generalized): Secondary | ICD-10-CM | POA: Insufficient documentation

## 2023-01-20 DIAGNOSIS — M5431 Sciatica, right side: Secondary | ICD-10-CM | POA: Diagnosis not present

## 2023-01-20 NOTE — Therapy (Signed)
OUTPATIENT PHYSICAL THERAPY TREATMENT/RECERT   Patient Name: Heather Watson MRN: [id number] DOB:1955-09-18, 67 y.o., female Today's Date: 01/21/2023  END OF SESSION:  PT End of Session - 01/20/23 0807     Visit Number 6    Number of Visits 28    Date for PT Re-Evaluation 03/17/23    Authorization Type [employer]    PT Start Time 1530    PT Stop Time 1613    PT Time Calculation (min) 43 min    Activity Tolerance Patient tolerated treatment well;No increased pain    Behavior During Therapy WFL for tasks assessed/performed                Past Medical History:  Diagnosis Date   Arthritis    Right knee   Family history of breast cancer    Gallstones    UTI (urinary tract infection)    Past Surgical History:  Procedure Laterality Date   BROW LIFT Bilateral 06/06/2021   Procedure: BLEPHAROPLASTY UPPER EYELID; W/ EXCESS SKIN BLEPHAROPTOSIS REPAIR; RESECT EX BILATERAL;  Surgeon: Imagene Riches, MD;  Location: [facility];  Service: Ophthalmology;  Laterality: Bilateral;  wants early arrival   COLONOSCOPY WITH PROPOFOL N/A 05/19/2016   Procedure: COLONOSCOPY WITH PROPOFOL;  Surgeon: Midge Minium, MD;  Location: [facility] ENDOSCOPY;  Service: Endoscopy;  Laterality: N/A;   COLONOSCOPY WITH PROPOFOL N/A 07/08/2021   Procedure: COLONOSCOPY WITH PROPOFOL;  Surgeon: Midge Minium, MD;  Location: [facility];  Service: Endoscopy;  Laterality: N/A;  1ST CASE< PLEASE   Patient Active Problem List   Diagnosis Date Noted   Intervertebral lumbar disc disorder with myelopathy, lumbar region 01/04/2023   Radiculopathy of lumbosacral region 01/04/2023   Weakness of foot, left 01/04/2023   Sciatica, right side 11/10/2022   Cough 09/13/2022   Special screening for malignant neoplasms, colon    Genetic testing 04/03/2020   Family history of breast cancer    Lumbar back pain 12/15/2019   ASCUS with positive high risk HPV cervical 01/29/2019   Vitamin D deficiency  01/17/2017   Colon cancer screening    Family history of colonic polyps 01/19/2016   History of meniscal tear 02/02/2014   Postmenopausal atrophic vaginitis 01/08/2013   Encounter for preventive health examination 01/08/2013   Menopause 05/14/2012   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   H/O thyroid cyst 05/14/2012    PCP: Sherlene Shams, MD  REFERRING PROVIDER: Bethanie Dicker, NP  REFERRING DIAG: M54.31 (ICD-10-CM) - Sciatica, right side M54.50 (ICD-10-CM) - Lumbar back pain  Rationale for Evaluation and Treatment: Rehabilitation  THERAPY DIAG:  Radiculopathy, lumbar region - Plan: PT plan of care cert/re-cert  Sciatica, right side - Plan: PT plan of care cert/re-cert  ONSET DATE: 5 weeks ago  SUBJECTIVE:  SUBJECTIVE STATEMENT:  Patient reports maybe a 1/10 right lateral lower leg "pulling"  sensation last night but none today. States went to MD yesterday with plan for now to continue with conservative approach and follow up in 6 weeks unless she develops increased pain or loss of function/strength. She states compliant with all HEP to date.    PERTINENT HISTORY:   Low back pain with R sciatca. Pt states not having low back pain. Has R L 5 dermatome symptoms which started about 5 weeks ago, gradual onset, unknown mechanism of injury. Has had R LE pain 5 times in her life. Usually gone after 4 days. Pain was bad for about 2 weeks when recent pain begain. Pain is usually at night: Pt sleeps on her R side, then on her back, then on her L side, then on her stomach. Pt also has pain R anterior ankle joint. Pt also feels R lateral foot pain. Pt also felt tingling in her 2nd and 3rd toes. Pt currently has tingling at the dorsal and plantar surface of her R foot. The pain in R L5 dermatome is gone, now  pain is just mostly on her R dorsal and plantar foot. Exercises 4-5 times a week.   PAIN:  Are you having pain? No pain today upon arrival    PRECAUTIONS: None  RED FLAGS: Bowel or bladder incontinence: No and Cauda equina syndrome: No   WEIGHT BEARING RESTRICTIONS: No  FALLS:  Has patient fallen in last 6 months? 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   NEXT MD VISIT: None   OBJECTIVE:  Note: Objective measures were completed at Evaluation unless otherwise noted.  DIAGNOSTIC FINDINGS:  MRI 12/30/22: IMPRESSION: 1. Lumbar spondylosis as outlined within the body of the report and with findings most notably as follows. 2. At L4-L5, there is a disc bulge. Superimposed moderate-sized central/left subarticular disc extrusion. Caudal migration of the left subarticular component of the disc extrusion. Mild-to-moderate facet arthropathy with ligamentum flavum hypertrophy. The disc extrusion contributes to multifactorial severe left subarticular stenosis, and encroaches upon the descending left L5 nerve root. Also at this level, there is multifactorial severe right subarticular stenosis (with likely impingement of the descending right L5 nerve root) and moderate-to-severe central canal stenosis. Bilateral neural foraminal narrowing (mild right, mild/moderate left). 3. At L3-L4, there is 4 mm grade 1 anterolisthesis. Multifactorial mild bilateral subarticular and mild-to-moderate central canal narrowing. Mild relative right neural foraminal narrowing. 4. At T12-L1, a broad-based left subarticular/foraminal disc protrusion minimally effaces the ventrolateral thecal sac. It also results in moderate left  neural foraminal narrowing, contacting (and mildly deforming) the undersurface of the exiting left T12 nerve root. 5. No more than mild relative spinal canal or neural foraminal narrowing at the remaining lumbar levels. 6. Levocurvature of the lumbar spine. 7. Cholecystolithiasis.  PATIENT SURVEYS:  FOTO Lumbar spine FOTO 72   LUMBAR ROM:   AROM eval  Flexion full  Extension WFL with R L5/L1 dermatome symptoms  Right lateral flexion WFL with R lateral foot (L5 dermatome symptoms)  Left lateral flexion WFL  Right rotation [facility] with R lateral foot symptoms (L5)  Left rotation WFL with slight R L5 symptoms in foot.    (Blank rows =  not tested)   LOWER EXTREMITY MMT:    MMT Right eval Left eval  Hip flexion 4 4-  Hip extension 4 (with R foot tingling initially) 4-  Hip abduction 4- with R lateral leg and anterior lateral foot pain L5 dermatome); R hip hike compensation  4-  Hip adduction    Hip internal rotation    Hip external rotation    Knee flexion 5 5  Knee extension 5 5  Ankle dorsiflexion    Ankle plantarflexion    Ankle inversion    Ankle eversion     (Blank rows = not tested)   TODAY'S TREATMENT:                                                                                                                              DATE: 01/20/2023  Review of anatomy regarding lumbar vertebrae, Nerve roots including L4, L5, S1, and anterior lower leg musculature including anterior tib, extensor hallicus longus, peroneals. Discussed how nerve may effect the sensory and motor function in the body.  Used internet for images to explain all above. Patient verbalized better understanding of her situation after review.    Physical therapy treatment session today consisted of completing assessment of goals and administration of testing as demonstrated and documented in flow sheet, treatment, and goals section of this note. Addition treatments may be found below.    Added toe yoga  to HEP- Instructed in ext great toe while pressing remains 4 toes down onto floor and hold then switch. Issued hand    PATIENT EDUCATION:  Education details: there-ex, HEP, POC Person educated: Patient Education method: Explanation, Demonstration, Tactile cues, Verbal cues, and Handouts Education comprehension: verbalized understanding and returned demonstration  HOME EXERCISE PROGRAM:  Access Code: [email or web address] URL: [email or web address] Date: 01/21/2023 Prepared by: Maureen Ralphs  Exercises - Toe Yoga - Alternating Great Toe and Lesser Toe Extension  - 1 x daily - 3 sets - 10 reps      EVAL: HEP discontinued due to unfavorable symptoms response ----------------------------------------- Access Code: [email or web address] URL: [email or web address] Date: 12/28/2022 Prepared by: Precious Bard  - Supine 90/90 Sciatic Nerve Glide with Knee Flexion/Extension  - 1 x daily - 7 x weekly - 2 sets - 5 reps - 5 hold  Updated 01/12/23:     ASSESSMENT:  CLINICAL IMPRESSION: Patient presents with good motivation for today's recert visit. She and her MD decided to continue with conservative treatment and she would like to continue with PT as her previous cert was set up for only a few visits. Due to her positive response with less overall pain and less numbness she would like to continue with progression of intervention to continue to help. Assessed goals today and patient is making progress with less overall reported pain and some improvement with MMT of BLE as well. She presents with more distal left LE weakness particularly left extensor hallucis longus. Patient's condition has the potential to improve in  response to therapy. Maximum improvement is yet to be obtained. The anticipated improvement is attainable and reasonable in a generally predictable time. She will continue to benefit from hybrid intervention approach, part core stabilization motor control training and part  lumber flexion directional preference program. Pt provided another  HEP exercise to work on weak left extensor hallucis longus. Pt will benefit from skilled physical therapy services to address the aforementioned deficits.   OBJECTIVE IMPAIRMENTS: decreased strength, improper body mechanics, postural dysfunction, and pain.   ACTIVITY LIMITATIONS: standing and sleeping  PARTICIPATION LIMITATIONS:   PERSONAL FACTORS: Age and Profession are also affecting patient's functional outcome.   REHAB POTENTIAL: Fair    CLINICAL DECISION MAKING: Stable/uncomplicated  EVALUATION COMPLEXITY: Low   GOALS: Goals reviewed with patient? Yes  SHORT TERM GOALS: Target date: 01/08/2023  Pt will be independent with her initial HEP to decrease R LE pain and paresthesia, improve ability to stand and tolerate sleeping positions more comfortably.  Baseline:Pt has started her initial HEP (12/21/2022); 01/20/2023- Patient able to verbalize good understanding and compliance with current HEP.  Goal status: MET  LONG TERM GOALS: Target date: 03/17/2023  Pt will have a decrease in R LE pain to 0/10 at most to promote ability to stand and sleep more comfortably.  Baseline: 2/10 at most for the past 3 weeks (12/21/2022); 01/20/2023= reports "pulling" 1/10 on right lower lateral leg Goal status: Progressing  2.  Pt will improve B hip extension and abduction strength by at least 1/2 MMT grade to promote ability to tolerate standing more comfortably for her R LE.  Baseline:  MMT Right eval Right 01/20/2023 Left eval Left  01/20/2023  Hip extension 4 (with R foot tingling initially) 4+ 4- 4+  Hip abduction 4- with R lateral leg and anterior lateral foot pain L5 dermatome); R hip hike compensation  4 4- 4  Extensor hallucis longus NT 5 NT 3+   Goal status: PROGRESSING  3.  Pt will improve her lumbar spine FOTO by at least 10 points as a demonstration of improved function.  Baseline: Lumbar spine FOTO 72  (12/21/2022); 01/20/2023= Will retest next session Goal status: INITIAL  4.  Patient will transition from formal skilled PT interventions to home/gym based exercise program with good knowledge of safe technique with basic exercises to avoid any further low back symptoms  Baseline: Patient not participating in any normal gym exercises due to fear of increasing her radicular symptoms  Goal status: NEW   PLAN:  PT FREQUENCY: 1-2x/week  PT DURATION: 3 weeks  PLANNED INTERVENTIONS: 97110-Therapeutic exercises, 97530- Therapeutic activity, [id number]- Neuromuscular re-education, 97140- Manual therapy, [id number]- Aquatic Therapy, 97014- Electrical stimulation (unattended), [id number]- Traction (mechanical), 95284- Ionotophoresis 4mg /ml Dexamethasone, Patient/Family education, Dry Needling, Joint mobilization, and Spinal mobilization.  PLAN FOR NEXT SESSION: *avoid extension/rotation due to active neurologic tissue involvement, recently seen 4mm anterolisthesis. Continue with LE and core strengthening. Administer FOTO again next visit  8:42 AM, 01/21/23    Lenda Kelp, PT [facility]  Outpatient Physical Therapy- [facility] [phone]  , 8:42 AM

## 2023-01-26 ENCOUNTER — Ambulatory Visit: Payer: 59

## 2023-01-26 DIAGNOSIS — M5416 Radiculopathy, lumbar region: Secondary | ICD-10-CM | POA: Diagnosis not present

## 2023-01-26 DIAGNOSIS — M5431 Sciatica, right side: Secondary | ICD-10-CM | POA: Diagnosis not present

## 2023-01-26 DIAGNOSIS — M6281 Muscle weakness (generalized): Secondary | ICD-10-CM

## 2023-01-26 NOTE — Therapy (Unsigned)
OUTPATIENT PHYSICAL THERAPY TREATMENT/RECERT   Patient Name: Heather Watson MRN: [id number] DOB:[birth year], 67 y.o., female Today's Date: 01/26/2023  END OF SESSION:       Past Medical History:  Diagnosis Date   Arthritis    Right knee   Family history of breast cancer    Gallstones    UTI (urinary tract infection)    Past Surgical History:  Procedure Laterality Date   BROW LIFT Bilateral 06/06/2021   Procedure: BLEPHAROPLASTY UPPER EYELID; W/ EXCESS SKIN BLEPHAROPTOSIS REPAIR; RESECT EX BILATERAL;  Surgeon: Imagene Riches, MD;  Location: [facility];  Service: Ophthalmology;  Laterality: Bilateral;  wants early arrival   COLONOSCOPY WITH PROPOFOL N/A 05/19/2016   Procedure: COLONOSCOPY WITH PROPOFOL;  Surgeon: Midge Minium, MD;  Location: [facility];  Service: Endoscopy;  Laterality: N/A;   COLONOSCOPY WITH PROPOFOL N/A 07/08/2021   Procedure: COLONOSCOPY WITH PROPOFOL;  Surgeon: Midge Minium, MD;  Location: [facility];  Service: Endoscopy;  Laterality: N/A;  1ST CASE< PLEASE   Patient Active Problem List   Diagnosis Date Noted   Intervertebral lumbar disc disorder with myelopathy, lumbar region 01/04/2023   Radiculopathy of lumbosacral region 01/04/2023   Weakness of foot, left 01/04/2023   Sciatica, right side 11/10/2022   Cough 09/13/2022   Special screening for malignant neoplasms, colon    Genetic testing 04/03/2020   Family history of breast cancer    Lumbar back pain 12/15/2019   ASCUS with positive high risk HPV cervical 01/29/2019   Vitamin D deficiency 01/17/2017   Colon cancer screening    Family history of colonic polyps 01/19/2016   History of meniscal tear 02/02/2014   Postmenopausal atrophic vaginitis 01/08/2013   Encounter for preventive health examination 01/08/2013   Menopause 05/14/2012   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   H/O thyroid cyst 05/14/2012    PCP: Sherlene Shams, MD  REFERRING PROVIDER: Bethanie Dicker,  NP  REFERRING DIAG: M54.31 (ICD-10-CM) - Sciatica, right side M54.50 (ICD-10-CM) - Lumbar back pain  Rationale for Evaluation and Treatment: Rehabilitation  THERAPY DIAG:  No diagnosis found.  ONSET DATE: 5 weeks ago  SUBJECTIVE:                                                                                                                                                                                           SUBJECTIVE STATEMENT:  Pt reports no pain recently. Reports no pain with movements.   PERTINENT HISTORY:   Low back pain with R sciatca. Pt states not having low back pain. Has R L 5 dermatome symptoms which started about 5 weeks ago, gradual onset, unknown  mechanism of injury. Has had R LE pain 5 times in her life. Usually gone after 4 days. Pain was bad for about 2 weeks when recent pain begain. Pain is usually at night: Pt sleeps on her R side, then on her back, then on her L side, then on her stomach. Pt also has pain R anterior ankle joint. Pt also feels R lateral foot pain. Pt also felt tingling in her 2nd and 3rd toes. Pt currently has tingling at the dorsal and plantar surface of her R foot. The pain in R L5 dermatome is gone, now pain is just mostly on her R dorsal and plantar foot. Exercises 4-5 times a week.   PAIN:  Are you having pain? No pain today upon arrival    PRECAUTIONS: None  RED FLAGS: Bowel or bladder incontinence: No and Cauda equina syndrome: No   WEIGHT BEARING RESTRICTIONS: No  FALLS:  Has patient fallen in last 6 months? 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   NEXT MD VISIT: None   OBJECTIVE:  Note: Objective measures were completed at Evaluation unless otherwise  noted.  DIAGNOSTIC FINDINGS:  MRI 12/30/22: IMPRESSION: 1. Lumbar spondylosis as outlined within the body of the report and with findings most notably as follows. 2. At L4-L5, there is a disc bulge. Superimposed moderate-sized central/left subarticular disc extrusion. Caudal migration of the left subarticular component of the disc extrusion. Mild-to-moderate facet arthropathy with ligamentum flavum hypertrophy. The disc extrusion contributes to multifactorial severe left subarticular stenosis, and encroaches upon the descending left L5 nerve root. Also at this level, there is multifactorial severe right subarticular stenosis (with likely impingement of the descending right L5 nerve root) and moderate-to-severe central canal stenosis. Bilateral neural foraminal narrowing (mild right, mild/moderate left). 3. At L3-L4, there is 4 mm grade 1 anterolisthesis. Multifactorial mild bilateral subarticular and mild-to-moderate central canal narrowing. Mild relative right neural foraminal narrowing. 4. At T12-L1, a broad-based left subarticular/foraminal disc protrusion minimally effaces the ventrolateral thecal sac. It also results in moderate left neural foraminal narrowing, contacting (and mildly deforming) the undersurface of the exiting left T12 nerve root. 5. No more than mild relative spinal canal or neural foraminal narrowing at the remaining lumbar levels. 6. Levocurvature of the lumbar spine. 7. Cholecystolithiasis.  PATIENT SURVEYS:  FOTO Lumbar spine FOTO 72   LUMBAR ROM:   AROM eval  Flexion full  Extension WFL with R L5/L1 dermatome symptoms  Right lateral flexion WFL with R lateral foot (L5 dermatome symptoms)  Left lateral flexion WFL  Right rotation [facility] with R lateral foot symptoms (L5)  Left rotation WFL with slight R L5 symptoms in foot.    (Blank rows = not tested)   LOWER EXTREMITY MMT:    MMT Right eval Left eval  Hip flexion 4 4-  Hip extension 4 (with  R foot tingling initially) 4-  Hip abduction 4- with R lateral leg and anterior lateral foot pain L5 dermatome); R hip hike compensation  4-  Hip adduction    Hip internal rotation    Hip external rotation    Knee flexion 5 5  Knee extension 5 5  Ankle dorsiflexion    Ankle plantarflexion    Ankle inversion  Ankle eversion     (Blank rows = not tested)   TODAY'S TREATMENT:                                                                                                                              DATE: 01/20/2023  Treadmill and elliptical x 3 min each no symptoms  Mini squats Trx 10x Squats with BUE support 10x  Toe yoga LLE scrunches x 60 sec, then completed bilat LLE only 10x with 5 sec holds  Seated great digit extension alt great toe ext x multiple reps, encouaged to hold 5 sec at a time  Arch lifts 10x each LE    Access Code: [email or web address] URL: [email or web address] Date: 01/26/2023 Prepared by: Temple Pacini  Exercises - Mini Squat  - 2 x daily - 5-6 x weekly - 3 sets - 10 reps - Heel Toe Raises with Counter Support  - 2 x daily - 7 x weekly - 2 sets - 10 reps -written instruction treadmill walking 5 min daily, instruction verbal for moderate walking speed, written instruction to use UE support with intervention    previous  -AA/ROM nerve glides on Nustep, level 2 x 5 minutes, seat 5, arms 7.5 -seated ABD brace marching x20, alternating   -seated seated jefferson curl to 6" high target (yoga block) x15 (cues to allow lumbar flexion to occur during down phase  -seated Left ankle DF 3lb AW x15   -seated ABD bracing + chest press x15 c 10lb ball   -seated seated jefferson curl to 4" high target (yoga  block) x15 (cues to allow lumbar flexion to occur during down phase)  -standing, leaning against wall behind, heels 12 inches from wall: bilat ankle DF 1x15   -seated ABD brace + yellow horABCT resisted BUE flexion 10x10sec      Review of anatomy  regarding lumbar vertebrae, Nerve roots including L4, L5, S1, and anterior lower leg musculature including anterior tib, extensor hallicus longus, peroneals. Discussed how nerve may effect the sensory and motor function in the body.  Used internet for images to explain all above. Patient verbalized better understanding of her situation after review.    Physical therapy treatment session today consisted of completing assessment of goals and administration of testing as demonstrated and documented in flow sheet, treatment, and goals section of this note. Addition treatments may be found below.    Added toe yoga to HEP- Instructed in ext great toe while pressing remains 4 toes down onto floor and hold then switch. Issued hand    PATIENT EDUCATION:  Education details: there-ex, HEP, POC Person educated: Patient Education method: Explanation, Demonstration, Tactile cues, Verbal cues, and Handouts Education comprehension: verbalized understanding and returned demonstration  HOME EXERCISE PROGRAM:  Access Code: [email or web address] URL: [email or web address] Date: 01/21/2023 Prepared by: Maureen Ralphs  Exercises - Toe Yoga - Alternating Great Toe and Lesser Toe Extension  - 1 x daily - 3 sets - 10 reps  EVAL: HEP discontinued due to unfavorable symptoms response ----------------------------------------- Access Code: [email or web address] URL: [email or web address] Date: 12/28/2022 Prepared by: Precious Bard  - Supine 90/90 Sciatic Nerve Glide with Knee Flexion/Extension  - 1 x daily - 7 x weekly - 2 sets - 5 reps - 5 hold  Updated 01/12/23:     ASSESSMENT:  CLINICAL IMPRESSION: Patient presents with good motivation for today's recert visit. She and her MD decided to continue with conservative treatment and she would like to continue with PT as her previous cert was set up for only a few visits. Due to her positive response with less overall pain and less numbness she  would like to continue with progression of intervention to continue to help. Assessed goals today and patient is making progress with less overall reported pain and some improvement with MMT of BLE as well. She presents with more distal left LE weakness particularly left extensor hallucis longus. Patient's condition has the potential to improve in response to therapy. Maximum improvement is yet to be obtained. The anticipated improvement is attainable and reasonable in a generally predictable time. She will continue to benefit from hybrid intervention approach, part core stabilization motor control training and part lumber flexion directional preference program. Pt provided another  HEP exercise to work on weak left extensor hallucis longus. Pt will benefit from skilled physical therapy services to address the aforementioned deficits.   OBJECTIVE IMPAIRMENTS: decreased strength, improper body mechanics, postural dysfunction, and pain.   ACTIVITY LIMITATIONS: standing and sleeping  PARTICIPATION LIMITATIONS:   PERSONAL FACTORS: Age and Profession are also affecting patient's functional outcome.   REHAB POTENTIAL: Fair    CLINICAL DECISION MAKING: Stable/uncomplicated  EVALUATION COMPLEXITY: Low   GOALS: Goals reviewed with patient? Yes  SHORT TERM GOALS: Target date: 01/08/2023  Pt will be independent with her initial HEP to decrease R LE pain and paresthesia, improve ability to stand and tolerate sleeping positions more comfortably.  Baseline:Pt has started her initial HEP (12/21/2022); 01/20/2023- Patient able to verbalize good understanding and compliance with current HEP.  Goal status: MET  LONG TERM GOALS: Target date: 03/17/2023  Pt will have a decrease in R LE pain to 0/10 at most to promote ability to stand and sleep more comfortably.  Baseline: 2/10 at most for the past 3 weeks (12/21/2022); 01/20/2023= reports "pulling" 1/10 on right lower lateral leg Goal status: Progressing  2.   Pt will improve B hip extension and abduction strength by at least 1/2 MMT grade to promote ability to tolerate standing more comfortably for her R LE.  Baseline:  MMT Right eval Right 01/20/2023 Left eval Left  01/20/2023  Hip extension 4 (with R foot tingling initially) 4+ 4- 4+  Hip abduction 4- with R lateral leg and anterior lateral foot pain L5 dermatome); R hip hike compensation  4 4- 4  Extensor hallucis longus NT 5 NT 3+   Goal status: PROGRESSING  3.  Pt will improve her lumbar spine FOTO by at least 10 points as a demonstration of improved function.  Baseline: Lumbar spine FOTO 72 (12/21/2022); 01/20/2023= Will retest next session Goal status: INITIAL  4.  Patient will transition from formal skilled PT interventions to home/gym based exercise program with good knowledge of safe technique with basic exercises to avoid any further low back symptoms  Baseline: Patient not participating in any normal gym exercises due to fear of increasing her radicular symptoms  Goal status: NEW   PLAN:  PT FREQUENCY: 1-2x/week  PT DURATION:  3 weeks  PLANNED INTERVENTIONS: 97110-Therapeutic exercises, 97530- Therapeutic activity, [id number]- Neuromuscular re-education, 97140- Manual therapy, [id number]- Aquatic Therapy, 97014- Electrical stimulation (unattended), [id number]- Traction (mechanical), 30865- Ionotophoresis 4mg /ml Dexamethasone, Patient/Family education, Dry Needling, Joint mobilization, and Spinal mobilization.  PLAN FOR NEXT SESSION: *avoid extension/rotation due to active neurologic tissue involvement, recently seen 4mm anterolisthesis. Continue with LE and core strengthening. Administer FOTO again next visit  1:20 PM, 01/26/23    Baird Kay, PT [facility]  Outpatient Physical Therapy- [facility] [phone]  , 1:20 PM

## 2023-01-28 ENCOUNTER — Ambulatory Visit: Payer: 59

## 2023-01-28 DIAGNOSIS — M5431 Sciatica, right side: Secondary | ICD-10-CM

## 2023-01-28 DIAGNOSIS — M6281 Muscle weakness (generalized): Secondary | ICD-10-CM

## 2023-01-28 DIAGNOSIS — M5416 Radiculopathy, lumbar region: Secondary | ICD-10-CM | POA: Diagnosis not present

## 2023-01-28 NOTE — Therapy (Signed)
OUTPATIENT PHYSICAL THERAPY TREATMENT   Patient Name: Heather Watson MRN: [id number] DOB:December 06, 1955, 67 y.o., female Today's Date: 01/28/2023  END OF SESSION:  PT End of Session - 01/28/23 1144     Visit Number 8    Number of Visits 28    Date for PT Re-Evaluation 03/17/23    Authorization Type [employer]    PT Start Time 1145    PT Stop Time 1229    PT Time Calculation (min) 44 min    Activity Tolerance Patient tolerated treatment well;No increased pain    Behavior During Therapy WFL for tasks assessed/performed                  Past Medical History:  Diagnosis Date   Arthritis    Right knee   Family history of breast cancer    Gallstones    UTI (urinary tract infection)    Past Surgical History:  Procedure Laterality Date   BROW LIFT Bilateral 06/06/2021   Procedure: BLEPHAROPLASTY UPPER EYELID; W/ EXCESS SKIN BLEPHAROPTOSIS REPAIR; RESECT EX BILATERAL;  Surgeon: Imagene Riches, MD;  Location: [facility];  Service: Ophthalmology;  Laterality: Bilateral;  wants early arrival   COLONOSCOPY WITH PROPOFOL N/A 05/19/2016   Procedure: COLONOSCOPY WITH PROPOFOL;  Surgeon: Midge Minium, MD;  Location: [facility];  Service: Endoscopy;  Laterality: N/A;   COLONOSCOPY WITH PROPOFOL N/A 07/08/2021   Procedure: COLONOSCOPY WITH PROPOFOL;  Surgeon: Midge Minium, MD;  Location: [facility];  Service: Endoscopy;  Laterality: N/A;  1ST CASE< PLEASE   Patient Active Problem List   Diagnosis Date Noted   Intervertebral lumbar disc disorder with myelopathy, lumbar region 01/04/2023   Radiculopathy of lumbosacral region 01/04/2023   Weakness of foot, left 01/04/2023   Sciatica, right side 11/10/2022   Cough 09/13/2022   Special screening for malignant neoplasms, colon    Genetic testing 04/03/2020   Family history of breast cancer    Lumbar back pain 12/15/2019   ASCUS with positive high risk HPV cervical 01/29/2019   Vitamin D deficiency  01/17/2017   Colon cancer screening    Family history of colonic polyps 01/19/2016   History of meniscal tear 02/02/2014   Postmenopausal atrophic vaginitis 01/08/2013   Encounter for preventive health examination 01/08/2013   Menopause 05/14/2012   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   H/O thyroid cyst 05/14/2012    PCP: Sherlene Shams, MD  REFERRING PROVIDER: Bethanie Dicker, NP  REFERRING DIAG: M54.31 (ICD-10-CM) - Sciatica, right side M54.50 (ICD-10-CM) - Lumbar back pain  Rationale for Evaluation and Treatment: Rehabilitation  THERAPY DIAG:  Muscle weakness (generalized)  Radiculopathy, lumbar region  Sciatica, right side  ONSET DATE: 5 weeks ago  SUBJECTIVE:  SUBJECTIVE STATEMENT: Patient wants to combine her exercises since she is doing 45 mins in the morning and night   PERTINENT HISTORY:   Low back pain with R sciatca. Pt states not having low back pain. Has R L 5 dermatome symptoms which started about 5 weeks ago, gradual onset, unknown mechanism of injury. Has had R LE pain 5 times in her life. Usually gone after 4 days. Pain was bad for about 2 weeks when recent pain begain. Pain is usually at night: Pt sleeps on her R side, then on her back, then on her L side, then on her stomach. Pt also has pain R anterior ankle joint. Pt also feels R lateral foot pain. Pt also felt tingling in her 2nd and 3rd toes. Pt currently has tingling at the dorsal and plantar surface of her R foot. The pain in R L5 dermatome is gone, now pain is just mostly on her R dorsal and plantar foot. Exercises 4-5 times a week.   PAIN:  Are you having pain? No pain today upon arrival    PRECAUTIONS: None  RED FLAGS: Bowel or bladder incontinence: No and Cauda equina syndrome: No   WEIGHT BEARING  RESTRICTIONS: No  FALLS:  Has patient fallen in last 6 months? 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   NEXT MD VISIT: None   OBJECTIVE:  Note: Objective measures were completed at Evaluation unless otherwise noted.  DIAGNOSTIC FINDINGS:  MRI 12/30/22: IMPRESSION: 1. Lumbar spondylosis as outlined within the body of the report and with findings most notably as follows. 2. At L4-L5, there is a disc bulge. Superimposed moderate-sized central/left subarticular disc extrusion. Caudal migration of the left subarticular component of the disc extrusion. Mild-to-moderate facet arthropathy with ligamentum flavum hypertrophy. The disc extrusion contributes to multifactorial severe left subarticular stenosis, and encroaches upon the descending left L5 nerve root. Also at this level, there is multifactorial severe right subarticular stenosis (with likely impingement of the descending right L5 nerve root) and moderate-to-severe central canal stenosis. Bilateral neural foraminal narrowing (mild right, mild/moderate left). 3. At L3-L4, there is 4 mm grade 1 anterolisthesis. Multifactorial mild bilateral subarticular and mild-to-moderate central canal narrowing. Mild relative right neural foraminal narrowing. 4. At T12-L1, a broad-based left subarticular/foraminal disc protrusion minimally effaces the ventrolateral thecal sac. It also results in moderate left neural foraminal narrowing, contacting (and mildly deforming) the undersurface of the exiting left T12 nerve root. 5. No more than mild relative spinal canal or neural foraminal narrowing at the remaining lumbar levels. 6. Levocurvature of the lumbar spine. 7.  Cholecystolithiasis.  PATIENT SURVEYS:  FOTO Lumbar spine FOTO 72   LUMBAR ROM:   AROM eval  Flexion full  Extension WFL with R L5/L1 dermatome symptoms  Right lateral flexion WFL with R lateral foot (L5 dermatome symptoms)  Left lateral flexion WFL  Right rotation [facility] with R lateral foot symptoms (L5)  Left rotation WFL with slight R L5 symptoms in foot.    (Blank rows = not tested)   LOWER EXTREMITY MMT:    MMT Right eval Left eval  Hip flexion 4 4-  Hip extension 4 (with R foot tingling initially) 4-  Hip abduction 4- with R lateral leg and  anterior lateral foot pain L5 dermatome); R hip hike compensation  4-  Hip adduction    Hip internal rotation    Hip external rotation    Knee flexion 5 5  Knee extension 5 5  Ankle dorsiflexion    Ankle plantarflexion    Ankle inversion    Ankle eversion     (Blank rows = not tested)   TODAY'S TREATMENT:                                                                                                                              DATE: 01/20/2023  TE: Blake Divine - review of safe use/safe return with the following machine -Elliptical - completes 5 min low intensity, no symptoms Comments: use of BUE support throughout. Discussion of gradual return to cardio equipment, start with treadmill, use BUE support 5 min daily up to moderate walking speed, can progress if pt continues to be pain-free with intervention  Review of HEP  Access Code: [email or web address] URL: [email or web address] Date: 01/28/2023 Prepared by: Precious Bard  Exercises - Seated Toe Towel Scrunches  - 1 x daily - 7 x weekly - 2 sets - 10 reps - 5 hold - Toe Yoga - Alternating Great Toe and Lesser Toe Extension  - 1 x daily - 7 x weekly - 2 sets - 10 reps - 5 hold - Seated Ankle Alphabet  - 1 x daily - 5 x weekly - 2 sets - 1 reps - 5 hold - Supine 90/90 Sciatic Nerve Glide with Knee Flexion/Extension  - 1 x daily - 5 x weekly - 2 sets - 5 reps - 5 hold - Big  toe mobilization  - 1 x daily - 7 x weekly - 2 sets - 2 reps - 30 hold - Full Leg Press  - 1 x daily - 2-3 x weekly - 3-4 sets - 10-12 reps - 5 hold - Hamstring Curl with Weight Machine  - 1 x daily - 2-3 x weekly - 3 sets - 10 reps - 5 hold - Knee Extension with Weight Machine  - 1 x daily - 2-3 x weekly - 3 sets - 10 reps - 5 hold  TRX mini squats 10x;  Laps between each exercise.     PATIENT EDUCATION:  Education details: there-ex, HEP, POC Person educated: Patient Education method: Explanation, Demonstration, Tactile cues, Verbal cues, and Handouts Education comprehension: verbalized understanding and returned demonstration  HOME EXERCISE PROGRAM: Access Code: [email or web address] URL: [email or web address] Date: 01/28/2023 Prepared by: Precious Bard  Exercises - Seated Toe Towel Scrunches  - 1 x daily - 7 x weekly - 2 sets - 10 reps - 5 hold - Toe Yoga - Alternating Great Toe and Lesser Toe Extension  - 1 x daily - 7 x weekly - 2 sets - 10 reps - 5 hold - Seated Ankle Alphabet  - 1 x daily - 5 x weekly - 2 sets - 1 reps - 5  hold - Supine 90/90 Sciatic Nerve Glide with Knee Flexion/Extension  - 1 x daily - 5 x weekly - 2 sets - 5 reps - 5 hold - Big toe mobilization  - 1 x daily - 7 x weekly - 2 sets - 2 reps - 30 hold - Full Leg Press  - 1 x daily - 2-3 x weekly - 3-4 sets - 10-12 reps - 5 hold - Hamstring Curl with Weight Machine  - 1 x daily - 2-3 x weekly - 3 sets - 10 reps - 5 hold - Knee Extension with Weight Machine  - 1 x daily - 2-3 x weekly - 3 sets - 10 reps - 5 hold    HEP updated Access Code: [email or web address] URL: [email or web address] Date: 01/26/2023 Prepared by: Temple Pacini  Exercises - Mini Squat  - 2 x daily - 5-6 x weekly - 3 sets - 10 reps - Heel Toe Raises with Counter Support  - 2 x daily - 7 x weekly - 2 sets - 10 reps -written instruction treadmill walking 5 min daily, instruction verbal for moderate walking speed, written instruction to  use UE support with intervention  Access Code: [email or web address] URL: [email or web address] Date: 01/21/2023 Prepared by: Maureen Ralphs  Exercises - Toe Yoga - Alternating Great Toe and Lesser Toe Extension  - 1 x daily - 3 sets - 10 reps      EVAL: HEP discontinued due to unfavorable symptoms response ----------------------------------------- Access Code: [email or web address] URL: [email or web address] Date: 12/28/2022 Prepared by: Precious Bard  - Supine 90/90 Sciatic Nerve Glide with Knee Flexion/Extension  - 1 x daily - 7 x weekly - 2 sets - 5 reps - 5 hold  Updated 01/12/23:     ASSESSMENT:  CLINICAL IMPRESSION:  Return to gym program introduction tolerated well today with gentle progression of normal program. Reduction of pre-injury weight and repetition performed for new exercise routine to perform. HEP modification performed due to lengthy HEP at this time. Pt will benefit from skilled physical therapy services to address the aforementioned deficits.   OBJECTIVE IMPAIRMENTS: decreased strength, improper body mechanics, postural dysfunction, and pain.   ACTIVITY LIMITATIONS: standing and sleeping  PARTICIPATION LIMITATIONS:   PERSONAL FACTORS: Age and Profession are also affecting patient's functional outcome.   REHAB POTENTIAL: Fair    CLINICAL DECISION MAKING: Stable/uncomplicated  EVALUATION COMPLEXITY: Low   GOALS: Goals reviewed with patient? Yes  SHORT TERM GOALS: Target date: 01/08/2023  Pt will be independent with her initial HEP to decrease R LE pain and paresthesia, improve ability to stand and tolerate sleeping positions more comfortably.  Baseline:Pt has started her initial HEP (12/21/2022); 01/20/2023- Patient able to verbalize good understanding and compliance with current HEP.  Goal status: MET  LONG TERM GOALS: Target date: 03/17/2023  Pt will have a decrease in R LE pain to 0/10 at most to promote ability to stand and sleep  more comfortably.  Baseline: 2/10 at most for the past 3 weeks (12/21/2022); 01/20/2023= reports "pulling" 1/10 on right lower lateral leg Goal status: Progressing  2.  Pt will improve B hip extension and abduction strength by at least 1/2 MMT grade to promote ability to tolerate standing more comfortably for her R LE.  Baseline:  MMT Right eval Right 01/20/2023 Left eval Left  01/20/2023  Hip extension 4 (with R foot tingling initially) 4+ 4- 4+  Hip abduction 4- with R lateral leg and anterior lateral foot pain L5 dermatome); R hip hike compensation  4 4- 4  Extensor hallucis longus NT 5 NT 3+   Goal status: PROGRESSING  3.  Pt will improve her lumbar spine FOTO by at least 10 points as a demonstration of improved function.  Baseline: Lumbar spine FOTO 72 (12/21/2022); 01/20/2023= Will retest next session Goal status: INITIAL  4.  Patient will transition from formal skilled PT interventions to home/gym based exercise program with good knowledge of safe technique with basic exercises to avoid any further low back symptoms  Baseline: Patient not participating in any normal gym exercises due to fear of increasing her radicular symptoms  Goal status: NEW   PLAN:  PT FREQUENCY: 1-2x/week  PT DURATION: 3 weeks  PLANNED INTERVENTIONS: 97110-Therapeutic exercises, 97530- Therapeutic activity, [id number]- Neuromuscular re-education, 97140- Manual therapy, [id number]- Aquatic Therapy, 97014- Electrical stimulation (unattended), 97012- Traction (mechanical), 40981- Ionotophoresis 4mg /ml Dexamethasone, Patient/Family education, Dry Needling, Joint mobilization, and Spinal mobilization.  PLAN FOR NEXT SESSION: *avoid extension/rotation due to active neurologic tissue involvement, recently seen 4mm anterolisthesis. Continue with LE and core strengthening.  1:50 PM, 01/28/23    Precious Bard, PT [facility]  Outpatient Physical Therapy- [facility] [phone]  , 1:50 PM

## 2023-02-01 ENCOUNTER — Other Ambulatory Visit
Admission: RE | Admit: 2023-02-01 | Discharge: 2023-02-01 | Disposition: A | Discharge status: Home / Self Care | Payer: 59 | Attending: Internal Medicine | Admitting: Internal Medicine

## 2023-02-01 DIAGNOSIS — E559 Vitamin D deficiency, unspecified: Secondary | ICD-10-CM | POA: Insufficient documentation

## 2023-02-01 DIAGNOSIS — Z8639 Personal history of other endocrine, nutritional and metabolic disease: Secondary | ICD-10-CM | POA: Diagnosis not present

## 2023-02-01 DIAGNOSIS — E538 Deficiency of other specified B group vitamins: Secondary | ICD-10-CM | POA: Insufficient documentation

## 2023-02-01 DIAGNOSIS — E782 Mixed hyperlipidemia: Secondary | ICD-10-CM | POA: Insufficient documentation

## 2023-02-01 LAB — CBC WITH DIFFERENTIAL/PLATELET
Abs Immature Granulocytes: 0.03 10*3/uL (ref 0.00–0.07)
Basophils Absolute: 0.1 10*3/uL (ref 0.0–0.1)
Basophils Relative: 2 %
Eosinophils Absolute: 0.2 10*3/uL (ref 0.0–0.5)
Eosinophils Relative: 4 %
HCT: 39.3 % (ref 36.0–46.0)
Hemoglobin: 13.3 g/dL (ref 12.0–15.0)
Immature Granulocytes: 1 %
Lymphocytes Relative: 35 %
Lymphs Abs: 1.5 10*3/uL (ref 0.7–4.0)
MCH: 30.6 pg (ref 26.0–34.0)
MCHC: 33.8 g/dL (ref 30.0–36.0)
MCV: 90.6 fL (ref 80.0–100.0)
Monocytes Absolute: 0.4 10*3/uL (ref 0.1–1.0)
Monocytes Relative: 10 %
Neutro Abs: 2.1 10*3/uL (ref 1.7–7.7)
Neutrophils Relative %: 48 %
Platelets: 265 10*3/uL (ref 150–400)
RBC: 4.34 MIL/uL (ref 3.87–5.11)
RDW: 12.2 % (ref 11.5–15.5)
WBC: 4.3 10*3/uL (ref 4.0–10.5)
nRBC: 0 % (ref 0.0–0.2)

## 2023-02-01 LAB — COMPREHENSIVE METABOLIC PANEL
ALT: 20 U/L (ref 0–44)
AST: 22 U/L (ref 15–41)
Albumin: 4 g/dL (ref 3.5–5.0)
Alkaline Phosphatase: 56 U/L (ref 38–126)
Anion gap: 9 (ref 5–15)
BUN: 18 mg/dL (ref 8–23)
CO2: 26 mmol/L (ref 22–32)
Calcium: 9 mg/dL (ref 8.9–10.3)
Chloride: 103 mmol/L (ref 98–111)
Creatinine, Ser: 0.64 mg/dL (ref 0.44–1.00)
GFR, Estimated: >60 mL/min (ref >60)
Glucose, Bld: 91 mg/dL (ref 70–99)
Potassium: 3.6 mmol/L (ref 3.5–5.1)
Sodium: 138 mmol/L (ref 135–145)
Total Bilirubin: 0.8 mg/dL (ref <1.2)
Total Protein: 6.7 g/dL (ref 6.5–8.1)

## 2023-02-01 LAB — LIPID PANEL
Cholesterol: 162 mg/dL (ref 0–200)
HDL: 78 mg/dL (ref >40)
LDL Cholesterol: 77 mg/dL (ref 0–99)
Total CHOL/HDL Ratio: 2.1 {ratio}
Triglycerides: 36 mg/dL (ref <150)
VLDL: 7 mg/dL (ref 0–40)

## 2023-02-01 LAB — VITAMIN D 25 HYDROXY (VIT D DEFICIENCY, FRACTURES): Vit D, 25-Hydroxy: 62.07 ng/mL (ref 30–100)

## 2023-02-01 LAB — VITAMIN B12: Vitamin B-12: 325 pg/mL (ref 180–914)

## 2023-02-01 LAB — TSH: TSH: 1.693 u[IU]/mL (ref 0.350–4.500)

## 2023-02-02 ENCOUNTER — Ambulatory Visit: Payer: 59

## 2023-02-02 DIAGNOSIS — M6281 Muscle weakness (generalized): Secondary | ICD-10-CM

## 2023-02-02 DIAGNOSIS — M5431 Sciatica, right side: Secondary | ICD-10-CM | POA: Diagnosis not present

## 2023-02-02 DIAGNOSIS — M5416 Radiculopathy, lumbar region: Secondary | ICD-10-CM | POA: Diagnosis not present

## 2023-02-02 NOTE — Therapy (Signed)
OUTPATIENT PHYSICAL THERAPY TREATMENT   Patient Name: Heather Watson MRN: [id number] DOB:1955-04-18, 67 y.o., female Today's Date: 02/02/2023  END OF SESSION:  PT End of Session - 02/02/23 1703     Visit Number 9    Number of Visits 28    Date for PT Re-Evaluation 03/17/23    Authorization Type [employer]    PT Start Time [phone]    PT Stop Time 1658    PT Time Calculation (min) 41 min    Activity Tolerance Patient tolerated treatment well;No increased pain    Behavior During Therapy WFL for tasks assessed/performed                   Past Medical History:  Diagnosis Date   Arthritis    Right knee   Family history of breast cancer    Gallstones    UTI (urinary tract infection)    Past Surgical History:  Procedure Laterality Date   BROW LIFT Bilateral 06/06/2021   Procedure: BLEPHAROPLASTY UPPER EYELID; W/ EXCESS SKIN BLEPHAROPTOSIS REPAIR; RESECT EX BILATERAL;  Surgeon: Imagene Riches, MD;  Location: [facility];  Service: Ophthalmology;  Laterality: Bilateral;  wants early arrival   COLONOSCOPY WITH PROPOFOL N/A 05/19/2016   Procedure: COLONOSCOPY WITH PROPOFOL;  Surgeon: Midge Minium, MD;  Location: [facility] ENDOSCOPY;  Service: Endoscopy;  Laterality: N/A;   COLONOSCOPY WITH PROPOFOL N/A 07/08/2021   Procedure: COLONOSCOPY WITH PROPOFOL;  Surgeon: Midge Minium, MD;  Location: [facility] ENDOSCOPY;  Service: Endoscopy;  Laterality: N/A;  1ST CASE< PLEASE   Patient Active Problem List   Diagnosis Date Noted   Intervertebral lumbar disc disorder with myelopathy, lumbar region 01/04/2023   Radiculopathy of lumbosacral region 01/04/2023   Weakness of foot, left 01/04/2023   Sciatica, right side 11/10/2022   Cough 09/13/2022   Special screening for malignant neoplasms, colon    Genetic testing 04/03/2020   Family history of breast cancer    Lumbar back pain 12/15/2019   ASCUS with positive high risk HPV cervical 01/29/2019   Vitamin D deficiency  01/17/2017   Colon cancer screening    Family history of colonic polyps 01/19/2016   History of meniscal tear 02/02/2014   Postmenopausal atrophic vaginitis 01/08/2013   Encounter for preventive health examination 01/08/2013   Menopause 05/14/2012   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   H/O thyroid cyst 05/14/2012    PCP: Sherlene Shams, MD  REFERRING PROVIDER: Bethanie Dicker, NP  REFERRING DIAG: M54.31 (ICD-10-CM) - Sciatica, right side M54.50 (ICD-10-CM) - Lumbar back pain  Rationale for Evaluation and Treatment: Rehabilitation  THERAPY DIAG:  Muscle weakness (generalized)  ONSET DATE: 5 weeks ago  SUBJECTIVE:  SUBJECTIVE STATEMENT: Pt reports things have been going well. Exercises have been going well, not experiencing any pain at the moment. Pt reports she has been completing 12 reps of gym activities.  PERTINENT HISTORY:   Low back pain with R sciatca. Pt states not having low back pain. Has R L 5 dermatome symptoms which started about 5 weeks ago, gradual onset, unknown mechanism of injury. Has had R LE pain 5 times in her life. Usually gone after 4 days. Pain was bad for about 2 weeks when recent pain begain. Pain is usually at night: Pt sleeps on her R side, then on her back, then on her L side, then on her stomach. Pt also has pain R anterior ankle joint. Pt also feels R lateral foot pain. Pt also felt tingling in her 2nd and 3rd toes. Pt currently has tingling at the dorsal and plantar surface of her R foot. The pain in R L5 dermatome is gone, now pain is just mostly on her R dorsal and plantar foot. Exercises 4-5 times a week.   PAIN:  Are you having pain? No pain today upon arrival    PRECAUTIONS: None  RED FLAGS: Bowel or bladder incontinence: No and Cauda equina syndrome:  No   WEIGHT BEARING RESTRICTIONS: No  FALLS:  Has patient fallen in last 6 months? 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   NEXT MD VISIT: None   OBJECTIVE:  Note: Objective measures were completed at Evaluation unless otherwise noted.  DIAGNOSTIC FINDINGS:  MRI 12/30/22: IMPRESSION: 1. Lumbar spondylosis as outlined within the body of the report and with findings most notably as follows. 2. At L4-L5, there is a disc bulge. Superimposed moderate-sized central/left subarticular disc extrusion. Caudal migration of the left subarticular component of the disc extrusion. Mild-to-moderate facet arthropathy with ligamentum flavum hypertrophy. The disc extrusion contributes to multifactorial severe left subarticular stenosis, and encroaches upon the descending left L5 nerve root. Also at this level, there is multifactorial severe right subarticular stenosis (with likely impingement of the descending right L5 nerve root) and moderate-to-severe central canal stenosis. Bilateral neural foraminal narrowing (mild right, mild/moderate left). 3. At L3-L4, there is 4 mm grade 1 anterolisthesis. Multifactorial mild bilateral subarticular and mild-to-moderate central canal narrowing. Mild relative right neural foraminal narrowing. 4. At T12-L1, a broad-based left subarticular/foraminal disc protrusion minimally effaces the ventrolateral thecal sac. It also results in moderate left neural foraminal narrowing, contacting (and mildly deforming) the undersurface of the exiting left T12 nerve root. 5. No more than mild relative spinal canal or neural foraminal narrowing at the remaining lumbar levels. 6. Levocurvature of  the lumbar spine. 7. Cholecystolithiasis.  PATIENT SURVEYS:  FOTO Lumbar spine FOTO 72   LUMBAR ROM:   AROM eval  Flexion full  Extension WFL with R L5/L1 dermatome symptoms  Right lateral flexion WFL with R lateral foot (L5 dermatome symptoms)  Left lateral flexion WFL  Right rotation [facility] with R lateral foot symptoms (L5)  Left rotation WFL with slight R L5 symptoms in foot.    (Blank rows = not tested)   LOWER EXTREMITY MMT:    MMT Right eval Left eval  Hip flexion 4 4-  Hip extension 4 (with R foot  tingling initially) 4-  Hip abduction 4- with R lateral leg and anterior lateral foot pain L5 dermatome); R hip hike compensation  4-  Hip adduction    Hip internal rotation    Hip external rotation    Knee flexion 5 5  Knee extension 5 5  Ankle dorsiflexion    Ankle plantarflexion    Ankle inversion    Ankle eversion     (Blank rows = not tested)   TODAY'S TREATMENT:                                                                                                                              DATE: 01/20/2023  TE: Blake Divine - review of safe use/safe return with the following machine -Elliptical - completes 5 min low intensity - 2 min completed at lvl 2 - no pain. Pt instructed she can try completing 5 more min following session as long as it remains pain-free. Comments: use of BUE support throughout.  -Leg press LVL 4, 2 sets of 15 reps - no pain. Instructed pt that is is OK to complete another set of 15 reps after session (pt plans to go to [place] later). Pt ambulates around gym for 2 laps following sets. - Knee ext machine LVL 3, 3x15 reps - no pain. Break with gait 2x around gym taken following first and third set. - knee flexion/hamstring curl machine, 3x15 - no pain. Break with gait 2x around gym taken following first set.  On mat table: neutral spine in table-top to cow position (flexion) 5x, performed slowly, advised to not go into spinal ext. No pain with  intervention.   PATIENT EDUCATION:  Education details: there-ex technique Person educated: Patient Education method: Explanation, Demonstration, Tactile cues, and Verbal cues Education comprehension: verbalized understanding and returned demonstration  HOME EXERCISE PROGRAM: Access Code: [email or web address] URL: [email or web address] Date: 01/28/2023 Prepared by: Precious Bard  Exercises - Seated Toe Towel Scrunches  - 1 x daily - 7 x weekly - 2 sets - 10 reps - 5 hold - Toe Yoga - Alternating Great Toe and Lesser Toe Extension  - 1 x daily - 7 x weekly - 2 sets - 10 reps - 5 hold - Seated Ankle Alphabet  - 1 x daily - 5 x weekly - 2 sets - 1 reps - 5 hold - Supine 90/90 Sciatic Nerve Glide with Knee Flexion/Extension  - 1 x daily - 5 x weekly - 2 sets - 5 reps - 5 hold - Big toe mobilization  - 1 x daily - 7 x weekly - 2 sets - 2 reps - 30 hold - Full Leg Press  - 1 x daily - 2-3 x weekly - 3-4 sets - 10-12 reps - 5 hold - Hamstring Curl with Weight Machine  - 1 x daily - 2-3 x weekly - 3 sets - 10 reps - 5 hold - Knee Extension with Weight Machine  - 1 x daily -  2-3 x weekly - 3 sets - 10 reps - 5 hold    HEP updated Access Code: [email or web address] URL: [email or web address] Date: 01/26/2023 Prepared by: Temple Pacini  Exercises - Mini Squat  - 2 x daily - 5-6 x weekly - 3 sets - 10 reps - Heel Toe Raises with Counter Support  - 2 x daily - 7 x weekly - 2 sets - 10 reps -written instruction treadmill walking 5 min daily, instruction verbal for moderate walking speed, written instruction to use UE support with intervention  Access Code: [email or web address] URL: [email or web address] Date: 01/21/2023 Prepared by: Maureen Ralphs  Exercises - Toe Yoga - Alternating Great Toe and Lesser Toe Extension  - 1 x daily - 3 sets - 10 reps      EVAL: HEP discontinued due to unfavorable symptoms response ----------------------------------------- Access Code:  [email or web address] URL: [email or web address] Date: 12/28/2022 Prepared by: Precious Bard  - Supine 90/90 Sciatic Nerve Glide with Knee Flexion/Extension  - 1 x daily - 7 x weekly - 2 sets - 5 reps - 5 hold  Updated 01/12/23:     ASSESSMENT:  CLINICAL IMPRESSION: Review of return-to-gym activities.  Pt able to advance number of reps with leg press, hamstring curl and knee ext to 15/set. She is able to complete this pain-free. Pt also advances to lvl 2 on Elliptical without pain. Pt reported to PT at end of session she is interested in returning to yoga activities that are appropriate for her. Plan to further address future visit. Pt will benefit from skilled physical therapy services to address the aforementioned deficits.   OBJECTIVE IMPAIRMENTS: decreased strength, improper body mechanics, postural dysfunction, and pain.   ACTIVITY LIMITATIONS: standing and sleeping  PARTICIPATION LIMITATIONS:   PERSONAL FACTORS: Age and Profession are also affecting patient's functional outcome.   REHAB POTENTIAL: Fair    CLINICAL DECISION MAKING: Stable/uncomplicated  EVALUATION COMPLEXITY: Low   GOALS: Goals reviewed with patient? Yes  SHORT TERM GOALS: Target date: 01/08/2023  Pt will be independent with her initial HEP to decrease R LE pain and paresthesia, improve ability to stand and tolerate sleeping positions more comfortably.  Baseline:Pt has started her initial HEP (12/21/2022); 01/20/2023- Patient able to verbalize good understanding and compliance with current HEP.  Goal status: MET  LONG TERM GOALS: Target date: 03/17/2023  Pt will have a decrease in R LE pain to 0/10 at most to promote ability to stand and sleep more comfortably.  Baseline: 2/10 at most for the past 3 weeks (12/21/2022); 01/20/2023= reports "pulling" 1/10 on right lower lateral leg Goal status: Progressing  2.  Pt will improve B hip extension and abduction strength by at least 1/2 MMT grade to promote  ability to tolerate standing more comfortably for her R LE.  Baseline:  MMT Right eval Right 01/20/2023 Left eval Left  01/20/2023  Hip extension 4 (with R foot tingling initially) 4+ 4- 4+  Hip abduction 4- with R lateral leg and anterior lateral foot pain L5 dermatome); R hip hike compensation  4 4- 4  Extensor hallucis longus NT 5 NT 3+   Goal status: PROGRESSING  3.  Pt will improve her lumbar spine FOTO by at least 10 points as a demonstration of improved function.  Baseline: Lumbar spine FOTO 72 (12/21/2022); 01/20/2023= Will retest next session Goal status: INITIAL  4.  Patient will transition from formal skilled PT interventions to home/gym based exercise program with good knowledge of safe technique with basic exercises to avoid any further low back  symptoms  Baseline: Patient not participating in any normal gym exercises due to fear of increasing her radicular symptoms  Goal status: NEW   PLAN:  PT FREQUENCY: 1-2x/week  PT DURATION: 3 weeks  PLANNED INTERVENTIONS: 97110-Therapeutic exercises, 97530- Therapeutic activity, [id number]- Neuromuscular re-education, 97140- Manual therapy, [id number]- Aquatic Therapy, 97014- Electrical stimulation (unattended), [id number]- Traction (mechanical), 27035- Ionotophoresis 4mg /ml Dexamethasone, Patient/Family education, Dry Needling, Joint mobilization, and Spinal mobilization.  PLAN FOR NEXT SESSION: *avoid extension/rotation due to active neurologic tissue involvement, recently seen 4mm anterolisthesis. Continue with LE and core strengthening.  5:10 PM, 02/02/23    Baird Kay, PT [facility]  Outpatient Physical Therapy- [facility] [phone]  , 5:10 PM

## 2023-02-03 NOTE — Therapy (Signed)
OUTPATIENT PHYSICAL THERAPY TREATMENT/ Physical Therapy Progress Note   Dates of reporting period  12/21/22   to   02/04/23     Patient Name: Heather Watson MRN: [id number] DOB:[birth year], 67 y.o., female Today's Date: 02/04/2023  END OF SESSION:  PT End of Session - 02/04/23 1143     Visit Number 10    Number of Visits 28    Date for PT Re-Evaluation 03/17/23    Authorization Type [employer]    PT Start Time 1145    PT Stop Time 1229    PT Time Calculation (min) 44 min    Activity Tolerance Patient tolerated treatment well;No increased pain    Behavior During Therapy WFL for tasks assessed/performed                    Past Medical History:  Diagnosis Date   Arthritis    Right knee   Family history of breast cancer    Gallstones    UTI (urinary tract infection)    Past Surgical History:  Procedure Laterality Date   BROW LIFT Bilateral 06/06/2021   Procedure: BLEPHAROPLASTY UPPER EYELID; W/ EXCESS SKIN BLEPHAROPTOSIS REPAIR; RESECT EX BILATERAL;  Surgeon: Imagene Riches, MD;  Location: [facility];  Service: Ophthalmology;  Laterality: Bilateral;  wants early arrival   COLONOSCOPY WITH PROPOFOL N/A 05/19/2016   Procedure: COLONOSCOPY WITH PROPOFOL;  Surgeon: Midge Minium, MD;  Location: [facility] ENDOSCOPY;  Service: Endoscopy;  Laterality: N/A;   COLONOSCOPY WITH PROPOFOL N/A 07/08/2021   Procedure: COLONOSCOPY WITH PROPOFOL;  Surgeon: Midge Minium, MD;  Location: [facility] ENDOSCOPY;  Service: Endoscopy;  Laterality: N/A;  1ST CASE< PLEASE   Patient Active Problem List   Diagnosis Date Noted   Intervertebral lumbar disc disorder with myelopathy, lumbar region 01/04/2023   Radiculopathy of lumbosacral region 01/04/2023   Weakness of foot, left 01/04/2023   Sciatica, right side 11/10/2022   Cough 09/13/2022   Special screening for malignant neoplasms, colon    Genetic testing 04/03/2020   Family history of breast cancer    Lumbar back pain  12/15/2019   ASCUS with positive high risk HPV cervical 01/29/2019   Vitamin D deficiency 01/17/2017   Colon cancer screening    Family history of colonic polyps 01/19/2016   History of meniscal tear 02/02/2014   Postmenopausal atrophic vaginitis 01/08/2013   Encounter for preventive health examination 01/08/2013   Menopause 05/14/2012   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   H/O thyroid cyst 05/14/2012    PCP: Sherlene Shams, MD  REFERRING PROVIDER: Bethanie Dicker, NP  REFERRING DIAG: M54.31 (ICD-10-CM) - Sciatica, right side M54.50 (ICD-10-CM) - Lumbar back pain  Rationale for Evaluation and Treatment: Rehabilitation  THERAPY DIAG:  Muscle weakness (generalized)  Radiculopathy, lumbar region  Sciatica, right side  ONSET DATE: 5 weeks ago  SUBJECTIVE:  SUBJECTIVE STATEMENT: Patient will go to [facility] [place] for two weeks in January, has been compliant with HEP.   PERTINENT HISTORY:   Low back pain with R sciatca. Pt states not having low back pain. Has R L 5 dermatome symptoms which started about 5 weeks ago, gradual onset, unknown mechanism of injury. Has had R LE pain 5 times in her life. Usually gone after 4 days. Pain was bad for about 2 weeks when recent pain begain. Pain is usually at night: Pt sleeps on her R side, then on her back, then on her L side, then on her stomach. Pt also has pain R anterior ankle joint. Pt also feels R lateral foot pain. Pt also felt tingling in her 2nd and 3rd toes. Pt currently has tingling at the dorsal and plantar surface of her R foot. The pain in R L5 dermatome is gone, now pain is just mostly on her R dorsal and plantar foot. Exercises 4-5 times a week.   PAIN:  Are you having pain? No pain today upon arrival    PRECAUTIONS: None  RED FLAGS: Bowel  or bladder incontinence: No and Cauda equina syndrome: No   WEIGHT BEARING RESTRICTIONS: No  FALLS:  Has patient fallen in last 6 months? 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   NEXT MD VISIT: None   OBJECTIVE:  Note: Objective measures were completed at Evaluation unless otherwise noted.  DIAGNOSTIC FINDINGS:  MRI 12/30/22: IMPRESSION: 1. Lumbar spondylosis as outlined within the body of the report and with findings most notably as follows. 2. At L4-L5, there is a disc bulge. Superimposed moderate-sized central/left subarticular disc extrusion. Caudal migration of the left subarticular component of the disc extrusion. Mild-to-moderate facet arthropathy with ligamentum flavum hypertrophy. The disc extrusion contributes to multifactorial severe left subarticular stenosis, and encroaches upon the descending left L5 nerve root. Also at this level, there is multifactorial severe right subarticular stenosis (with likely impingement of the descending right L5 nerve root) and moderate-to-severe central canal stenosis. Bilateral neural foraminal narrowing (mild right, mild/moderate left). 3. At L3-L4, there is 4 mm grade 1 anterolisthesis. Multifactorial mild bilateral subarticular and mild-to-moderate central canal narrowing. Mild relative right neural foraminal narrowing. 4. At T12-L1, a broad-based left subarticular/foraminal disc protrusion minimally effaces the ventrolateral thecal sac. It also results in moderate left neural foraminal narrowing, contacting (and mildly deforming) the undersurface of the exiting left T12 nerve root. 5. No more than mild relative spinal canal or neural foraminal narrowing  at the remaining lumbar levels. 6. Levocurvature of the lumbar spine. 7. Cholecystolithiasis.  PATIENT SURVEYS:  FOTO Lumbar spine FOTO 72   LUMBAR ROM:   AROM eval  Flexion full  Extension WFL with R L5/L1 dermatome symptoms  Right lateral flexion WFL with R lateral foot (L5 dermatome symptoms)  Left lateral flexion WFL  Right rotation [facility] with R lateral foot symptoms (L5)  Left rotation WFL with slight R L5 symptoms in foot.    (Blank rows = not tested)   LOWER EXTREMITY MMT:    MMT Right eval Left eval  Hip flexion 4 4-  Hip extension 4 (with R foot tingling initially) 4-  Hip abduction 4- with R lateral leg and anterior  lateral foot pain L5 dermatome); R hip hike compensation  4-  Hip adduction    Hip internal rotation    Hip external rotation    Knee flexion 5 5  Knee extension 5 5  Ankle dorsiflexion    Ankle plantarflexion    Ankle inversion    Ankle eversion     (Blank rows = not tested)   TODAY'S TREATMENT:                                                                                                                              DATE: 01/20/2023 Physical therapy treatment session today consisted of completing assessment of goals and administration of testing as demonstrated and documented in flow sheet, treatment, and goals section of this note. Addition treatments may be found below.   TE: Core activation TrA 10x Core activation with march  Swiss ball trA 10x 5 second holds Review HEP: Access Code: [email or web address] URL: [email or web address] Date: 02/04/2023 Prepared by: Precious Bard  Exercises - Hooklying Transversus Abdominis Palpation  - 1 x daily - 3 x weekly - 2 sets - 10 reps - 5 hold - Supine March with Posterior Pelvic Tilt  - 1 x daily - 3 x weekly - 2 sets - 10 reps - 5 hold - Dead Bug with Swiss Ball  - 1 x daily - 3 x weekly - 2 sets - 10 reps - 5 hold - Prone Plank Elbows on Ball  - 1 x daily - 3 x weekly - 2 sets - 2 reps - 30  hold - Standing Anti-Rotation Press with Anchored Resistance  - 1 x daily - 3 x weekly - 2 sets - 10 reps - 5 hold  Education: Lifting baby allowed but not from floor; keep baby close to body Yoga stretches ok as long as not in extension  PATIENT EDUCATION:  Education details: there-ex technique Person educated: Patient Education method: Explanation, Demonstration, Tactile cues, and Verbal cues Education comprehension: verbalized understanding and returned demonstration  HOME EXERCISE PROGRAM: Access Code: [email or web address] URL: [email or web address] Date: 01/28/2023 Prepared by: Precious Bard  Exercises - Seated Toe Towel Scrunches  - 1 x daily - 7 x weekly - 2 sets - 10 reps - 5 hold - Toe Yoga - Alternating Great Toe and Lesser Toe Extension  - 1 x daily - 7 x weekly - 2 sets - 10 reps - 5 hold - Seated Ankle Alphabet  - 1 x daily - 5 x weekly - 2 sets - 1 reps - 5 hold - Supine 90/90 Sciatic Nerve Glide with Knee Flexion/Extension  - 1 x daily - 5 x weekly - 2 sets - 5 reps - 5 hold - Big toe mobilization  - 1 x daily - 7 x weekly - 2 sets - 2 reps - 30 hold - Full Leg Press  - 1 x daily - 2-3 x weekly - 3-4 sets - 10-12 reps - 5 hold -  Hamstring Curl with Weight Machine  - 1 x daily - 2-3 x weekly - 3 sets - 10 reps - 5 hold - Knee Extension with Weight Machine  - 1 x daily - 2-3 x weekly - 3 sets - 10 reps - 5 hold   Access Code: [email or web address] URL: [email or web address] Date: 02/04/2023 Prepared by: Precious Bard  Exercises - Hooklying Transversus Abdominis Palpation  - 1 x daily - 3 x weekly - 2 sets - 10 reps - 5 hold - Supine March with Posterior Pelvic Tilt  - 1 x daily - 3 x weekly - 2 sets - 10 reps - 5 hold - Dead Bug with Swiss Ball  - 1 x daily - 3 x weekly - 2 sets - 10 reps - 5 hold - Prone Plank Elbows on Ball  - 1 x daily - 3 x weekly - 2 sets - 2 reps - 30 hold - Standing Anti-Rotation Press with Anchored Resistance  - 1 x daily - 3 x weekly - 2  sets - 10 reps - 5 hold   HEP updated Access Code: [email or web address] URL: [email or web address] Date: 01/26/2023 Prepared by: Temple Pacini  Exercises - Mini Squat  - 2 x daily - 5-6 x weekly - 3 sets - 10 reps - Heel Toe Raises with Counter Support  - 2 x daily - 7 x weekly - 2 sets - 10 reps -written instruction treadmill walking 5 min daily, instruction verbal for moderate walking speed, written instruction to use UE support with intervention  Access Code: [email or web address] URL: [email or web address] Date: 01/21/2023 Prepared by: Maureen Ralphs  Exercises - Toe Yoga - Alternating Great Toe and Lesser Toe Extension  - 1 x daily - 3 sets - 10 reps      EVAL: HEP discontinued due to unfavorable symptoms response ----------------------------------------- Access Code: [email or web address] URL: [email or web address] Date: 12/28/2022 Prepared by: Precious Bard  - Supine 90/90 Sciatic Nerve Glide with Knee Flexion/Extension  - 1 x daily - 7 x weekly - 2 sets - 5 reps - 5 hold  Updated 01/12/23:     ASSESSMENT:  CLINICAL IMPRESSION:  Patient's condition has the potential to improve in response to therapy. Maximum improvement is yet to be obtained. The anticipated improvement is attainable and reasonable in a generally predictable time. Core exercise HEP added to plan with patient demonstrating understanding. She is able to engage core and maintain core engagement for prolonged periods of time. Pt will benefit from skilled physical therapy services to address the aforementioned deficits.   OBJECTIVE IMPAIRMENTS: decreased strength, improper body mechanics, postural dysfunction, and pain.   ACTIVITY LIMITATIONS: standing and sleeping  PARTICIPATION LIMITATIONS:   PERSONAL FACTORS: Age and Profession are also affecting patient's functional outcome.   REHAB POTENTIAL: Fair    CLINICAL DECISION MAKING: Stable/uncomplicated  EVALUATION COMPLEXITY:  Low   GOALS: Goals reviewed with patient? Yes  SHORT TERM GOALS: Target date: 01/08/2023  Pt will be independent with her initial HEP to decrease R LE pain and paresthesia, improve ability to stand and tolerate sleeping positions more comfortably.  Baseline:Pt has started her initial HEP (12/21/2022); 01/20/2023- Patient able to verbalize good understanding and compliance with current HEP.  Goal status: MET  LONG TERM GOALS: Target date: 03/17/2023  Pt will have a decrease in R LE pain to 0/10 at most to promote ability to stand and sleep more comfortably.  Baseline: 2/10 at most for the past 3 weeks (12/21/2022); 01/20/2023= reports "pulling" 1/10 on right lower lateral leg Goal  status: Progressing 12/19: 12/19: 0/10 pain  MET  2.  Pt will improve B hip extension and abduction strength by at least 1/2 MMT grade to promote ability to tolerate standing more comfortably for her R LE.  Baseline:  MMT Right eval Right 01/20/2023 Left eval Left  01/20/2023  Hip extension 4 (with R foot tingling initially) 4+ 4- 4+  Hip abduction 4- with R lateral leg and anterior lateral foot pain L5 dermatome); R hip hike compensation  4 4- 4  Extensor hallucis longus NT 5 NT 3+   Goal status: PROGRESSING  3.  Pt will improve her lumbar spine FOTO by at least 10 points as a demonstration of improved function.  Baseline: Lumbar spine FOTO 72 (12/21/2022); 01/20/2023= Will retest next session 12/19: 74% Goal status: Progress   4.  Patient will transition from formal skilled PT interventions to home/gym based exercise program with good knowledge of safe technique with basic exercises to avoid any further low back symptoms  Baseline: Patient not participating in any normal gym exercises due to fear of increasing her radicular symptoms 12/19: progressing gym program   Goal status: Ongoing   PLAN:  PT FREQUENCY: 1-2x/week  PT DURATION: 3 weeks  PLANNED INTERVENTIONS: 97110-Therapeutic exercises, 97530-  Therapeutic activity, 97112- Neuromuscular re-education, 97140- Manual therapy, [id number]- Aquatic Therapy, 97014- Electrical stimulation (unattended), [id number]- Traction (mechanical), 52841- Ionotophoresis 4mg /ml Dexamethasone, Patient/Family education, Dry Needling, Joint mobilization, and Spinal mobilization.  PLAN FOR NEXT SESSION: *avoid extension/rotation due to active neurologic tissue involvement, recently seen 4mm anterolisthesis. Continue with LE and core strengthening.  1:19 PM, 02/04/23    Precious Bard, PT [facility] [phone]  , 1:19 PM

## 2023-02-04 ENCOUNTER — Ambulatory Visit: Payer: 59

## 2023-02-04 DIAGNOSIS — M6281 Muscle weakness (generalized): Secondary | ICD-10-CM | POA: Diagnosis not present

## 2023-02-04 DIAGNOSIS — M5431 Sciatica, right side: Secondary | ICD-10-CM | POA: Diagnosis not present

## 2023-02-04 DIAGNOSIS — M5416 Radiculopathy, lumbar region: Secondary | ICD-10-CM | POA: Diagnosis not present

## 2023-02-05 ENCOUNTER — Other Ambulatory Visit (HOSPITAL_COMMUNITY)
Admission: RE | Admit: 2023-02-05 | Discharge: 2023-02-05 | Disposition: A | Discharge status: Home / Self Care | Payer: 59 | Source: Ambulatory Visit | Attending: Internal Medicine | Admitting: Internal Medicine

## 2023-02-05 ENCOUNTER — Ambulatory Visit (INDEPENDENT_AMBULATORY_CARE_PROVIDER_SITE_OTHER): Payer: 59 | Admitting: Internal Medicine

## 2023-02-05 ENCOUNTER — Encounter: Payer: Self-pay | Admitting: Internal Medicine

## 2023-02-05 ENCOUNTER — Other Ambulatory Visit: Payer: Self-pay

## 2023-02-05 VITALS — BP 122/70 | HR 71 | Ht 64.0 in | Wt 128.8 lb

## 2023-02-05 DIAGNOSIS — Z124 Encounter for screening for malignant neoplasm of cervix: Secondary | ICD-10-CM | POA: Insufficient documentation

## 2023-02-05 DIAGNOSIS — Z0000 Encounter for general adult medical examination without abnormal findings: Secondary | ICD-10-CM | POA: Diagnosis not present

## 2023-02-05 DIAGNOSIS — Z1151 Encounter for screening for human papillomavirus (HPV): Secondary | ICD-10-CM | POA: Insufficient documentation

## 2023-02-05 DIAGNOSIS — F5105 Insomnia due to other mental disorder: Secondary | ICD-10-CM

## 2023-02-05 DIAGNOSIS — M5106 Intervertebral disc disorders with myelopathy, lumbar region: Secondary | ICD-10-CM

## 2023-02-05 DIAGNOSIS — R87610 Atypical squamous cells of undetermined significance on cytologic smear of cervix (ASC-US): Secondary | ICD-10-CM | POA: Diagnosis not present

## 2023-02-05 DIAGNOSIS — R87810 Cervical high risk human papillomavirus (HPV) DNA test positive: Secondary | ICD-10-CM

## 2023-02-05 DIAGNOSIS — Z01419 Encounter for gynecological examination (general) (routine) without abnormal findings: Secondary | ICD-10-CM | POA: Diagnosis present

## 2023-02-05 DIAGNOSIS — F419 Anxiety disorder, unspecified: Secondary | ICD-10-CM

## 2023-02-05 MED ORDER — GABAPENTIN 300 MG PO CAPS
300.0000 mg | ORAL_CAPSULE | Freq: Three times a day (TID) | ORAL | 0 refills | Status: DC
  Filled 2023-02-05: qty 60, 20d supply, fill #0

## 2023-02-05 MED ORDER — PREDNISONE 10 MG PO TABS
ORAL_TABLET | ORAL | 0 refills | Status: AC
  Filled 2023-02-05: qty 21, 6d supply, fill #0

## 2023-02-05 NOTE — Assessment & Plan Note (Signed)

## 2023-02-05 NOTE — Patient Instructions (Signed)
I have refilled the prednisone taper and the gabapentin (at a higher dose) in case you have another episode during your travels  Gabapentin:  300 mg three times daily for 10 days,  then 2 times daily for ten days,  then 1 time daily for ten days  You can continue the gabapentin using the 100 mg capsules if necessary    SalonPas patches with 4% lidocaine  may also help numb the nerve root

## 2023-02-05 NOTE — Progress Notes (Unsigned)
Patient ID: Heather Watson, female    DOB: 1955/08/20  Age: 67 y.o. MRN: [id number]  The patient is here for annual preventive examination and management of other chronic and acute problems.   The risk factors are reflected in the social history.   The roster of all physicians providing medical care to patient - is listed in the Snapshot section of the chart.   Activities of daily living:  The patient is 100% independent in all ADLs: dressing, toileting, feeding as well as independent mobility   Home safety : The patient has smoke detectors in the home. They wear seatbelts.  There are no unsecured firearms at home. There is no violence in the home.    There is no risks for hepatitis, STDs or HIV. There is no   history of blood transfusion. They have no travel history to infectious disease endemic areas of the world.   The patient has seen their dentist in the last six month. They have seen their eye doctor in the last year. The patinet  denies slight hearing difficulty with regard to whispered voices and some television programs.  They have deferred audiologic testing in the last year.  They do not  have excessive sun exposure. Discussed the need for sun protection: hats, long sleeves and use of sunscreen if there is significant sun exposure.    Diet: the importance of a healthy diet is discussed. They do have a healthy diet.   The benefits of regular aerobic exercise were discussed. The patient  exercises  3 to 5 days per week  for  60 minutes.    Depression screen: there are no signs or vegative symptoms of depression- irritability, change in appetite, anhedonia, sadness/tearfullness.   The following portions of the patient's history were reviewed and updated as appropriate: allergies, current medications, past family history, past medical history,  past surgical history, past social history  and problem list.   Visual acuity was not assessed per patient preference since the patient has  regular follow up with an  ophthalmologist. Hearing and body mass index were assessed and reviewed.    During the course of the visit the patient was educated and counseled about appropriate screening and preventive services including : fall prevention , diabetes screening, nutrition counseling, colorectal cancer screening, and recommended immunizations.    Chief Complaint:  Resolving Left  sided sciatica .  Reviewed ER Visit Nov 13 and follow up neurosurgical consultation n Decompressive surgery was advised due to weakness on exam  but deferred by patient.  Pain is improving since starting PT since Nov 4.  : MRI demonstrated a 4 5 disc herniation with impingement of the L5 nerve root. On physical examination she has a positive straight leg raise, weakness in the L5 myotome including loss of toe extension great toe extension and some weakness in her dorsiflexion. She has decreased medial hamstrings reflex. Decree sensation in her L5 distribution. Given these findings we did offer her surgical decompression given the weakness.     Review of Symptoms  Patient denies headache, fevers, malaise, unintentional weight loss, skin rash, eye pain, sinus congestion and sinus pain, sore throat, dysphagia,  hemoptysis , cough, dyspnea, wheezing, chest pain, palpitations, orthopnea, edema, abdominal pain, nausea, melena, diarrhea, constipation, flank pain, dysuria, hematuria, urinary  Frequency, nocturia, numbness, tingling, seizures,  Focal weakness, Loss of consciousness,  Tremor, insomnia, depression, anxiety, and suicidal ideation.    Physical Exam:  BP 122/70   Pulse 71   Ht  5\' 4"  (1.626 m)   Wt 128 lb 12.8 oz (58.4 kg)   LMP 04/12/2006   SpO2 99%   BMI 22.11 kg/m    Physical Exam Vitals reviewed.  Constitutional:      General: She is not in acute distress.    Appearance: Normal appearance. She is well-developed and normal weight. She is not ill-appearing, toxic-appearing or diaphoretic.  HENT:      Head: Normocephalic.     Right Ear: Tympanic membrane, ear canal and external ear normal. There is no impacted cerumen.     Left Ear: Tympanic membrane, ear canal and external ear normal. There is no impacted cerumen.     Nose: Nose normal.     Mouth/Throat:     Mouth: Mucous membranes are moist.     Pharynx: Oropharynx is clear.  Eyes:     General: No scleral icterus.       Right eye: No discharge.        Left eye: No discharge.     Conjunctiva/sclera: Conjunctivae normal.     Pupils: Pupils are equal, round, and reactive to light.  Neck:     Thyroid: No thyromegaly.     Vascular: No carotid bruit or JVD.  Cardiovascular:     Rate and Rhythm: Normal rate and regular rhythm.     Heart sounds: Normal heart sounds.  Pulmonary:     Effort: Pulmonary effort is normal. No respiratory distress.     Breath sounds: Normal breath sounds.  Chest:  Breasts:    Breasts are symmetrical.     Right: Normal. No swelling, inverted nipple, mass, nipple discharge, skin change or tenderness.     Left: Normal. No swelling, inverted nipple, mass, nipple discharge, skin change or tenderness.  Abdominal:     General: Bowel sounds are normal.     Palpations: Abdomen is soft. There is no mass.     Tenderness: There is no abdominal tenderness. There is no guarding or rebound.     Hernia: There is no hernia in the left inguinal area or right inguinal area.  Genitourinary:    Exam position: Lithotomy position.     Pubic Area: No rash or pubic lice.      Labia:        Right: No rash, tenderness, lesion or injury.        Left: No rash, tenderness, lesion or injury.      Vagina: Normal.     Cervix: Normal.     Uterus: Normal.      Adnexa: Right adnexa normal and left adnexa normal.  Musculoskeletal:        General: Normal range of motion.     Cervical back: Normal range of motion and neck supple.  Lymphadenopathy:     Cervical: No cervical adenopathy.     Upper Body:     Right upper body: No  supraclavicular, axillary or pectoral adenopathy.     Left upper body: No supraclavicular, axillary or pectoral adenopathy.     Lower Body: No right inguinal adenopathy. No left inguinal adenopathy.  Skin:    General: Skin is warm and dry.  Neurological:     General: No focal deficit present.     Mental Status: She is alert and oriented to person, place, and time. Mental status is at baseline.  Psychiatric:        Mood and Affect: Mood normal.        Behavior: Behavior normal.  Thought Content: Thought content normal.        Judgment: Judgment normal.    Assessment and Plan: Intervertebral lumbar disc disorder with myelopathy, lumbar region  Encounter for preventive health examination Assessment & Plan: age appropriate education and counseling updated, referrals for preventative services and immunizations addressed, dietary and smoking counseling addressed, most recent labs reviewed.  I have personally reviewed and have noted:   1) the patient's medical and social history 2) The pt's use of alcohol, tobacco, and illicit drugs 3) The patient's current medications and supplements 4) Functional ability including ADL's, fall risk, home safety risk, hearing and visual impairment 5) Diet and physical activities 6) Evidence for depression or mood disorder 7) The patient's height, weight, and BMI have been recorded in the chart   I have made referrals, and provided counseling and education based on review of the above    Cervical cancer screening -     Cytology - PAP  ASCUS with positive high risk HPV cervical Assessment & Plan: She was referred to Dr Elesa Massed.  Colposcopy was negative march 2021 and annual PAP smear have been normal, last one Jan 2023 .  She prefers to continue annual screening, which  was done today    Insomnia secondary to anxiety Assessment & Plan: Managed with prn alprazolam .The risks and benefits of benzodiazepine use were reviewed with patient today  including excessive sedation leading to respiratory depression,  impaired thinking/driving, and addiction.  Patient was advised to avoid concurrent use with alcohol, to use medication only as needed and not to share with others  .    Other orders -     predniSONE; Take 6 tablets (60 mg total) by mouth daily for 1 day, THEN 5 tablets (50 mg total) daily for 1 day, THEN 4 tablets (40 mg total) daily for 1 day, THEN 3 tablets (30 mg total) daily for 1 day, THEN 2 tablets (20 mg total) daily for 1 day, THEN 1 tablet (10 mg total) daily for 1 day.  Dispense: 21 tablet; Refill: 0 -     Gabapentin; Take 1 capsule (300 mg total) by mouth 3 (three) times daily.  Dispense: 60 capsule; Refill: 0    No follow-ups on file.  Sherlene Shams, MD

## 2023-02-07 NOTE — Assessment & Plan Note (Signed)
Managed with prn alprazolam . The risks and benefits of benzodiazepine use were reviewed  with patient today including excessive sedation leading to respiratory depression,  impaired thinking/driving, and addiction.  Patient was advised to avoid concurrent use with alcohol, to use medication only as needed and not to share with others  .  

## 2023-02-07 NOTE — Assessment & Plan Note (Signed)
She was referred to Dr Elesa Massed.  Colposcopy was negative march 2021 and annual PAP smear have been normal, last one Jan 2023 .  She prefers to continue annual screening, which  was done today

## 2023-02-08 ENCOUNTER — Other Ambulatory Visit: Payer: Self-pay

## 2023-02-08 NOTE — Therapy (Signed)
OUTPATIENT PHYSICAL THERAPY TREATMENT     Patient Name: Heather Watson MRN: [id number] DOB:1956-01-07, 67 y.o., female Today's Date: 02/09/2023  END OF SESSION:  PT End of Session - 02/09/23 0800     Visit Number 11    Number of Visits 28    Date for PT Re-Evaluation 03/17/23    Authorization Type [employer]    PT Start Time 0800    PT Stop Time [phone]    PT Time Calculation (min) 44 min    Activity Tolerance Patient tolerated treatment well;No increased pain    Behavior During Therapy WFL for tasks assessed/performed                     Past Medical History:  Diagnosis Date   Arthritis    Right knee   Family history of breast cancer    Gallstones    UTI (urinary tract infection)    Past Surgical History:  Procedure Laterality Date   BROW LIFT Bilateral 06/06/2021   Procedure: BLEPHAROPLASTY UPPER EYELID; W/ EXCESS SKIN BLEPHAROPTOSIS REPAIR; RESECT EX BILATERAL;  Surgeon: Imagene Riches, MD;  Location: [facility];  Service: Ophthalmology;  Laterality: Bilateral;  wants early arrival   COLONOSCOPY WITH PROPOFOL N/A 05/19/2016   Procedure: COLONOSCOPY WITH PROPOFOL;  Surgeon: Midge Minium, MD;  Location: [facility];  Service: Endoscopy;  Laterality: N/A;   COLONOSCOPY WITH PROPOFOL N/A 07/08/2021   Procedure: COLONOSCOPY WITH PROPOFOL;  Surgeon: Midge Minium, MD;  Location: [facility];  Service: Endoscopy;  Laterality: N/A;  1ST CASE< PLEASE   Patient Active Problem List   Diagnosis Date Noted   Intervertebral lumbar disc disorder with myelopathy, lumbar region 01/04/2023   Radiculopathy of lumbosacral region 01/04/2023   Weakness of foot, left 01/04/2023   Sciatica, right side 11/10/2022   Special screening for malignant neoplasms, colon    Genetic testing 04/03/2020   Family history of breast cancer    Lumbar back pain 12/15/2019   ASCUS with positive high risk HPV cervical 01/29/2019   Vitamin D deficiency 01/17/2017    Colon cancer screening    Family history of colonic polyps 01/19/2016   History of meniscal tear 02/02/2014   Postmenopausal atrophic vaginitis 01/08/2013   Encounter for preventive health examination 01/08/2013   Menopause 05/14/2012   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   H/O thyroid cyst 05/14/2012    PCP: Sherlene Shams, MD  REFERRING PROVIDER: Bethanie Dicker, NP  REFERRING DIAG: M54.31 (ICD-10-CM) - Sciatica, right side M54.50 (ICD-10-CM) - Lumbar back pain  Rationale for Evaluation and Treatment: Rehabilitation  THERAPY DIAG:  Muscle weakness (generalized)  Radiculopathy, lumbar region  Sciatica, right side  ONSET DATE: 5 weeks ago  SUBJECTIVE:  SUBJECTIVE STATEMENT: Patient has new L second toe pain after alphabet   PERTINENT HISTORY:   Low back pain with R sciatca. Pt states not having low back pain. Has R L 5 dermatome symptoms which started about 5 weeks ago, gradual onset, unknown mechanism of injury. Has had R LE pain 5 times in her life. Usually gone after 4 days. Pain was bad for about 2 weeks when recent pain begain. Pain is usually at night: Pt sleeps on her R side, then on her back, then on her L side, then on her stomach. Pt also has pain R anterior ankle joint. Pt also feels R lateral foot pain. Pt also felt tingling in her 2nd and 3rd toes. Pt currently has tingling at the dorsal and plantar surface of her R foot. The pain in R L5 dermatome is gone, now pain is just mostly on her R dorsal and plantar foot. Exercises 4-5 times a week.   PAIN:  Are you having pain? No pain today upon arrival    PRECAUTIONS: None  RED FLAGS: Bowel or bladder incontinence: No and Cauda equina syndrome: No   WEIGHT BEARING RESTRICTIONS: No  FALLS:  Has patient fallen in last 6  months? 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   NEXT MD VISIT: None   OBJECTIVE:  Note: Objective measures were completed at Evaluation unless otherwise noted.  DIAGNOSTIC FINDINGS:  MRI 12/30/22: IMPRESSION: 1. Lumbar spondylosis as outlined within the body of the report and with findings most notably as follows. 2. At L4-L5, there is a disc bulge. Superimposed moderate-sized central/left subarticular disc extrusion. Caudal migration of the left subarticular component of the disc extrusion. Mild-to-moderate facet arthropathy with ligamentum flavum hypertrophy. The disc extrusion contributes to multifactorial severe left subarticular stenosis, and encroaches upon the descending left L5 nerve root. Also at this level, there is multifactorial severe right subarticular stenosis (with likely impingement of the descending right L5 nerve root) and moderate-to-severe central canal stenosis. Bilateral neural foraminal narrowing (mild right, mild/moderate left). 3. At L3-L4, there is 4 mm grade 1 anterolisthesis. Multifactorial mild bilateral subarticular and mild-to-moderate central canal narrowing. Mild relative right neural foraminal narrowing. 4. At T12-L1, a broad-based left subarticular/foraminal disc protrusion minimally effaces the ventrolateral thecal sac. It also results in moderate left neural foraminal narrowing, contacting (and mildly deforming) the undersurface of the exiting left T12 nerve root. 5. No more than mild relative spinal canal or neural foraminal narrowing at the remaining lumbar levels. 6. Levocurvature of the lumbar spine. 7. Cholecystolithiasis.  PATIENT SURVEYS:  FOTO Lumbar  spine FOTO 72   LUMBAR ROM:   AROM eval  Flexion full  Extension WFL with R L5/L1 dermatome symptoms  Right lateral flexion WFL with R lateral foot (L5 dermatome symptoms)  Left lateral flexion WFL  Right rotation [facility] with R lateral foot symptoms (L5)  Left rotation WFL with slight R L5 symptoms in foot.    (Blank rows = not tested)   LOWER EXTREMITY MMT:    MMT Right eval Left eval  Hip flexion 4 4-  Hip extension 4 (with R foot tingling initially) 4-  Hip abduction 4- with R lateral leg and anterior lateral foot pain L5 dermatome); R hip  hike compensation  4-  Hip adduction    Hip internal rotation    Hip external rotation    Knee flexion 5 5  Knee extension 5 5  Ankle dorsiflexion    Ankle plantarflexion    Ankle inversion    Ankle eversion     (Blank rows = not tested)   TODAY'S TREATMENT:                                                                                                                              DATE: 01/20/2023 Manual: Second toe MTP mobilizations x 8 minutes Great toe extension/flexion 10x Second toe distraction 3x30 seconds Roller to calf x 6 minutes ; BLE  TherEx: Bird dog 5x each side TrA activation with single knee abduction and return back to neutral alignment 10x March with 5lb DB with core activation 10x each side Calf stretch 30 seconds x 2 trials  Education on stretching of hip flexor: lunge pose 30 seconds Education on modifications if needed of yoga poses patient demonstrates    Review core: Access Code: [email or web address] URL: [email or web address] Date: 02/09/2023 Prepared by: Precious Bard  Exercises - Bird Dog  - 1 x daily - 3 x weekly - 2 sets - 5 reps - 3 hold - Standing Hip Flexion March  - 1 x daily - 3 x weekly - 2 sets - 10 reps - 3 hold - Bent Knee Fallouts  - 1 x daily - 7 x weekly - 2 sets - 10 reps - 2 hold PATIENT EDUCATION:  Education details: there-ex technique Person educated: Patient Education  method: Explanation, Demonstration, Tactile cues, and Verbal cues Education comprehension: verbalized understanding and returned demonstration  HOME EXERCISE PROGRAM: Access Code: [email or web address] URL: [email or web address] Date: 01/28/2023 Prepared by: Precious Bard  Exercises - Seated Toe Towel Scrunches  - 1 x daily - 7 x weekly - 2 sets - 10 reps - 5 hold - Toe Yoga - Alternating Great Toe and Lesser Toe Extension  - 1 x daily - 7 x weekly - 2 sets - 10 reps - 5 hold - Seated Ankle Alphabet  - 1 x daily - 5 x weekly - 2 sets - 1 reps - 5 hold - Supine 90/90 Sciatic Nerve Glide with Knee Flexion/Extension  - 1 x daily - 5 x weekly - 2 sets - 5 reps - 5 hold - Big toe mobilization  - 1 x daily - 7 x weekly - 2 sets - 2 reps - 30 hold - Full Leg Press  - 1 x daily - 2-3 x weekly - 3-4 sets - 10-12 reps - 5 hold - Hamstring Curl with Weight Machine  - 1 x daily - 2-3 x weekly - 3 sets - 10 reps - 5 hold - Knee Extension with Weight Machine  - 1 x daily - 2-3 x weekly - 3 sets - 10 reps - 5 hold   Access Code: [email or web address] URL: [email or web address]  Date: 02/04/2023 Prepared by: Precious Bard  Exercises - Hooklying Transversus Abdominis Palpation  - 1 x daily - 3 x weekly - 2 sets - 10 reps - 5 hold - Supine March with Posterior Pelvic Tilt  - 1 x daily - 3 x weekly - 2 sets - 10 reps - 5 hold - Dead Bug with Swiss Ball  - 1 x daily - 3 x weekly - 2 sets - 10 reps - 5 hold - Prone Plank Elbows on Ball  - 1 x daily - 3 x weekly - 2 sets - 2 reps - 30 hold - Standing Anti-Rotation Press with Anchored Resistance  - 1 x daily - 3 x weekly - 2 sets - 10 reps - 5 hold   HEP updated Access Code: [email or web address] URL: [email or web address] Date: 01/26/2023 Prepared by: Temple Pacini  Exercises - Mini Squat  - 2 x daily - 5-6 x weekly - 3 sets - 10 reps - Heel Toe Raises with Counter Support  - 2 x daily - 7 x weekly - 2 sets - 10 reps -written instruction treadmill  walking 5 min daily, instruction verbal for moderate walking speed, written instruction to use UE support with intervention  Access Code: [email or web address] URL: [email or web address] Date: 01/21/2023 Prepared by: Maureen Ralphs  Exercises - Toe Yoga - Alternating Great Toe and Lesser Toe Extension  - 1 x daily - 3 sets - 10 reps      EVAL: HEP discontinued due to unfavorable symptoms response ----------------------------------------- Access Code: [email or web address] URL: [email or web address] Date: 12/28/2022 Prepared by: Precious Bard  - Supine 90/90 Sciatic Nerve Glide with Knee Flexion/Extension  - 1 x daily - 7 x weekly - 2 sets - 5 reps - 5 hold  Updated 01/12/23:     ASSESSMENT:  CLINICAL IMPRESSION: Patient tolerates modification of yoga positions and core activation as she did not like the swiss ball exercises from previous session. Patient's L foot mobilized with improved alignment by end of session. Gentle stretching tolerated well this session.. Pt will benefit from skilled physical therapy services to address the aforementioned deficits.   OBJECTIVE IMPAIRMENTS: decreased strength, improper body mechanics, postural dysfunction, and pain.   ACTIVITY LIMITATIONS: standing and sleeping  PARTICIPATION LIMITATIONS:   PERSONAL FACTORS: Age and Profession are also affecting patient's functional outcome.   REHAB POTENTIAL: Fair    CLINICAL DECISION MAKING: Stable/uncomplicated  EVALUATION COMPLEXITY: Low   GOALS: Goals reviewed with patient? Yes  SHORT TERM GOALS: Target date: 01/08/2023  Pt will be independent with her initial HEP to decrease R LE pain and paresthesia, improve ability to stand and tolerate sleeping positions more comfortably.  Baseline:Pt has started her initial HEP (12/21/2022); 01/20/2023- Patient able to verbalize good understanding and compliance with current HEP.  Goal status: MET  LONG TERM GOALS: Target date:  03/17/2023  Pt will have a decrease in R LE pain to 0/10 at most to promote ability to stand and sleep more comfortably.  Baseline: 2/10 at most for the past 3 weeks (12/21/2022); 01/20/2023= reports "pulling" 1/10 on right lower lateral leg Goal status: Progressing 12/19: 12/19: 0/10 pain  MET  2.  Pt will improve B hip extension and abduction strength by at least 1/2 MMT grade to promote ability to tolerate standing more comfortably for her R LE.  Baseline:  MMT Right eval Right 01/20/2023 Left eval Left  01/20/2023  Hip extension 4 (with R foot tingling initially) 4+ 4- 4+  Hip abduction 4- with R lateral leg and  anterior lateral foot pain L5 dermatome); R hip hike compensation  4 4- 4  Extensor hallucis longus NT 5 NT 3+   Goal status: PROGRESSING  3.  Pt will improve her lumbar spine FOTO by at least 10 points as a demonstration of improved function.  Baseline: Lumbar spine FOTO 72 (12/21/2022); 01/20/2023= Will retest next session 12/19: 74% Goal status: Progress   4.  Patient will transition from formal skilled PT interventions to home/gym based exercise program with good knowledge of safe technique with basic exercises to avoid any further low back symptoms  Baseline: Patient not participating in any normal gym exercises due to fear of increasing her radicular symptoms 12/19: progressing gym program   Goal status: Ongoing   PLAN:  PT FREQUENCY: 1-2x/week  PT DURATION: 3 weeks  PLANNED INTERVENTIONS: 97110-Therapeutic exercises, 97530- Therapeutic activity, 97112- Neuromuscular re-education, 97140- Manual therapy, [id number]- Aquatic Therapy, 97014- Electrical stimulation (unattended), [id number]- Traction (mechanical), 56387- Ionotophoresis 4mg /ml Dexamethasone, Patient/Family education, Dry Needling, Joint mobilization, and Spinal mobilization.  PLAN FOR NEXT SESSION: *avoid extension/rotation due to active neurologic tissue involvement, recently seen 4mm anterolisthesis. Continue with  LE and core strengthening.  11:27 AM, 02/09/23    Precious Bard, PT [facility]  Outpatient Physical Therapy- [facility] [phone]  , 11:27 AM

## 2023-02-09 ENCOUNTER — Other Ambulatory Visit: Payer: Self-pay

## 2023-02-09 ENCOUNTER — Ambulatory Visit: Payer: 59

## 2023-02-09 DIAGNOSIS — M5431 Sciatica, right side: Secondary | ICD-10-CM | POA: Diagnosis not present

## 2023-02-09 DIAGNOSIS — M5416 Radiculopathy, lumbar region: Secondary | ICD-10-CM

## 2023-02-09 DIAGNOSIS — M6281 Muscle weakness (generalized): Secondary | ICD-10-CM

## 2023-02-09 LAB — CYTOLOGY - PAP
Comment: NEGATIVE
Diagnosis: NEGATIVE
High risk HPV: NEGATIVE

## 2023-02-15 NOTE — Therapy (Signed)
 OUTPATIENT PHYSICAL THERAPY TREATMENT     Patient Name: Heather Watson MRN: [id number] DOB:Jul 17, 1955, 67 y.o., female Today's Date: 02/16/2023  END OF SESSION:  PT End of Session - 02/16/23 1527     Visit Number 12    Number of Visits 28    Date for PT Re-Evaluation 03/17/23    Authorization Type [employer]    PT Start Time 1530    PT Stop Time 1614    PT Time Calculation (min) 44 min    Activity Tolerance Patient tolerated treatment well;No increased pain    Behavior During Therapy WFL for tasks assessed/performed                      Past Medical History:  Diagnosis Date   Arthritis    Right knee   Family history of breast cancer    Gallstones    UTI (urinary tract infection)    Past Surgical History:  Procedure Laterality Date   BROW LIFT Bilateral 06/06/2021   Procedure: BLEPHAROPLASTY UPPER EYELID; W/ EXCESS SKIN BLEPHAROPTOSIS REPAIR; RESECT EX BILATERAL;  Surgeon: Ashley Greig HERO, MD;  Location: [facility];  Service: Ophthalmology;  Laterality: Bilateral;  wants early arrival   COLONOSCOPY WITH PROPOFOL  N/A 05/19/2016   Procedure: COLONOSCOPY WITH PROPOFOL ;  Surgeon: Rogelia Copping, MD;  Location: [facility];  Service: Endoscopy;  Laterality: N/A;   COLONOSCOPY WITH PROPOFOL  N/A 07/08/2021   Procedure: COLONOSCOPY WITH PROPOFOL ;  Surgeon: Copping Rogelia, MD;  Location: [facility];  Service: Endoscopy;  Laterality: N/A;  1ST CASE< PLEASE   Patient Active Problem List   Diagnosis Date Noted   Intervertebral lumbar disc disorder with myelopathy, lumbar region 01/04/2023   Radiculopathy of lumbosacral region 01/04/2023   Weakness of foot, left 01/04/2023   Sciatica, right side 11/10/2022   Special screening for malignant neoplasms, colon    Genetic testing 04/03/2020   Family history of breast cancer    Lumbar back pain 12/15/2019   ASCUS with positive high risk HPV cervical 01/29/2019   Vitamin D  deficiency 01/17/2017    Colon cancer screening    Family history of colonic polyps 01/19/2016   History of meniscal tear 02/02/2014   Postmenopausal atrophic vaginitis 01/08/2013   Encounter for preventive health examination 01/08/2013   Menopause 05/14/2012   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   H/O thyroid  cyst 05/14/2012    PCP: Marylynn Verneita CROME, MD  REFERRING PROVIDER: Gretel App, NP  REFERRING DIAG: M54.31 (ICD-10-CM) - Sciatica, right side M54.50 (ICD-10-CM) - Lumbar back pain  Rationale for Evaluation and Treatment: Rehabilitation  THERAPY DIAG:  Muscle weakness (generalized)  Radiculopathy, lumbar region  Sciatica, right side  ONSET DATE: 5 weeks ago  SUBJECTIVE:  SUBJECTIVE STATEMENT: Patient will be on vacation for two weeks visiting her daughter.    PERTINENT HISTORY:   Low back pain with R sciatca. Pt states not having low back pain. Has R L 5 dermatome symptoms which started about 5 weeks ago, gradual onset, unknown mechanism of injury. Has had R LE pain 5 times in her life. Usually gone after 4 days. Pain was bad for about 2 weeks when recent pain begain. Pain is usually at night: Pt sleeps on her R side, then on her back, then on her L side, then on her stomach. Pt also has pain R anterior ankle joint. Pt also feels R lateral foot pain. Pt also felt tingling in her 2nd and 3rd toes. Pt currently has tingling at the dorsal and plantar surface of her R foot. The pain in R L5 dermatome is gone, now pain is just mostly on her R dorsal and plantar foot. Exercises 4-5 times a week.   PAIN:  Are you having pain? No pain today upon arrival    PRECAUTIONS: None  RED FLAGS: Bowel or bladder incontinence: No and Cauda equina syndrome: No   WEIGHT BEARING RESTRICTIONS: No  FALLS:  Has patient fallen  in last 6 months? 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   NEXT MD VISIT: None   OBJECTIVE:  Note: Objective measures were completed at Evaluation unless otherwise noted.  DIAGNOSTIC FINDINGS:  MRI 12/30/22: IMPRESSION: 1. Lumbar spondylosis as outlined within the body of the report and with findings most notably as follows. 2. At L4-L5, there is a disc bulge. Superimposed moderate-sized central/left subarticular disc extrusion. Caudal migration of the left subarticular component of the disc extrusion. Mild-to-moderate facet arthropathy with ligamentum flavum hypertrophy. The disc extrusion contributes to multifactorial severe left subarticular stenosis, and encroaches upon the descending left L5 nerve root. Also at this level, there is multifactorial severe right subarticular stenosis (with likely impingement of the descending right L5 nerve root) and moderate-to-severe central canal stenosis. Bilateral neural foraminal narrowing (mild right, mild/moderate left). 3. At L3-L4, there is 4 mm grade 1 anterolisthesis. Multifactorial mild bilateral subarticular and mild-to-moderate central canal narrowing. Mild relative right neural foraminal narrowing. 4. At T12-L1, a broad-based left subarticular/foraminal disc protrusion minimally effaces the ventrolateral thecal sac. It also results in moderate left neural foraminal narrowing, contacting (and mildly deforming) the undersurface of the exiting left T12 nerve root. 5. No more than mild relative spinal canal or neural foraminal narrowing at the remaining lumbar levels. 6. Levocurvature of the lumbar spine. 7. Cholecystolithiasis.  PATIENT SURVEYS:  FOTO  Lumbar spine FOTO 72   LUMBAR ROM:   AROM eval  Flexion full  Extension WFL with R L5/L1 dermatome symptoms  Right lateral flexion WFL with R lateral foot (L5 dermatome symptoms)  Left lateral flexion WFL  Right rotation [facility] with R lateral foot symptoms (L5)  Left rotation WFL with slight R L5 symptoms in foot.    (Blank rows = not tested)   LOWER EXTREMITY MMT:    MMT Right eval Left eval  Hip flexion 4 4-  Hip extension 4 (with R foot tingling initially) 4-  Hip abduction 4- with R lateral leg and anterior lateral foot pain L5  dermatome); R hip hike compensation  4-  Hip adduction    Hip internal rotation    Hip external rotation    Knee flexion 5 5  Knee extension 5 5  Ankle dorsiflexion    Ankle plantarflexion    Ankle inversion    Ankle eversion     (Blank rows = not tested)   TODAY'S TREATMENT:                                                                                                                              DATE: 02/16/23  TherEx: TrA activation 10x 5 second holds Froggers modification 10x; -multiple attempts prior to performing Lateral modified plank 30 second each side x 2 trials   Squat sequence 10x standard, 10x pause, 10x pulse in front of mirror Farmer carry 2 KB 160 ft  Squat and pick up cone bring to body and stand and give to PT for initiation of picking baby up from floor x 8 Modified single leg sit to stand 10x each LE with UE support  Large swiss ball rollout 10x  Neuro re-ed  Standing with CGA next to support surface:  Airex pad: static stand 30 seconds x 2 trials, noticeable trembling of ankles/LE's with fatigue and challenge to maintain stability Airex pad: horizontal head turns 30 seconds scanning room 10x ; cueing for arc of motion  Airex pad: vertical head turns 30 seconds, cueing for arc of motion, noticeable sway with upward gaze increasing demand on ankle righting reaction musculature Airex pad: one foot on 6 step one foot  on airex pad, hold position for 30 seconds, switch legs, 2x each LE;   PATIENT EDUCATION:  Education details: there-ex technique Person educated: Patient Education method: Explanation, Demonstration, Tactile cues, and Verbal cues Education comprehension: verbalized understanding and returned demonstration  HOME EXERCISE PROGRAM: Access Code: [email or web address] URL: [email or web address] Date: 01/28/2023 Prepared by: Cherylene Ferrufino  Exercises - Seated Toe Towel Scrunches  - 1 x daily - 7 x weekly - 2 sets - 10 reps - 5 hold - Toe Yoga - Alternating Great Toe and Lesser Toe Extension  - 1 x daily - 7 x weekly - 2 sets - 10 reps - 5 hold - Seated Ankle Alphabet  - 1 x daily - 5 x weekly - 2 sets - 1 reps - 5 hold - Supine 90/90 Sciatic Nerve Glide with Knee Flexion/Extension  - 1 x daily - 5 x weekly - 2 sets - 5 reps - 5 hold - Big toe mobilization  - 1 x daily - 7 x weekly - 2 sets - 2 reps - 30 hold - Full Leg Press  - 1 x daily - 2-3 x weekly - 3-4 sets - 10-12 reps - 5 hold - Hamstring Curl with Weight Machine  - 1 x daily - 2-3 x weekly - 3 sets - 10 reps - 5 hold - Knee Extension with Weight Machine  - 1  x daily - 2-3 x weekly - 3 sets - 10 reps - 5 hold   Access Code: [email or web address] URL: [email or web address] Date: 02/04/2023 Prepared by: Icie Kuznicki  Exercises - Hooklying Transversus Abdominis Palpation  - 1 x daily - 3 x weekly - 2 sets - 10 reps - 5 hold - Supine March with Posterior Pelvic Tilt  - 1 x daily - 3 x weekly - 2 sets - 10 reps - 5 hold - Dead Bug with Swiss Ball  - 1 x daily - 3 x weekly - 2 sets - 10 reps - 5 hold - Prone Plank Elbows on Ball  - 1 x daily - 3 x weekly - 2 sets - 2 reps - 30 hold - Standing Anti-Rotation Press with Anchored Resistance  - 1 x daily - 3 x weekly - 2 sets - 10 reps - 5 hold   HEP updated Access Code: [email or web address] URL: [email or web address] Date: 01/26/2023 Prepared by: Darryle Patten  Exercises - Mini  Squat  - 2 x daily - 5-6 x weekly - 3 sets - 10 reps - Heel Toe Raises with Counter Support  - 2 x daily - 7 x weekly - 2 sets - 10 reps -written instruction treadmill walking 5 min daily, instruction verbal for moderate walking speed, written instruction to use UE support with intervention  Access Code: [email or web address] URL: [email or web address] Date: 01/21/2023 Prepared by: Reyes London  Exercises - Toe Yoga - Alternating Great Toe and Lesser Toe Extension  - 1 x daily - 3 sets - 10 reps      EVAL: HEP discontinued due to unfavorable symptoms response ----------------------------------------- Access Code: [email or web address] URL: [email or web address] Date: 12/28/2022 Prepared by: Anias Bartol  - Supine 90/90 Sciatic Nerve Glide with Knee Flexion/Extension  - 1 x daily - 7 x weekly - 2 sets - 5 reps - 5 hold  Updated 01/12/23:     ASSESSMENT:  CLINICAL IMPRESSION: Patient tolerates progressive strengthening and stabilizer exercises. She is able to be introduced to balance without back exacerbation today. She does have some limitation reaching to floor still which will improve with continued therapy. Pt will benefit from skilled physical therapy services to address the aforementioned deficits.   OBJECTIVE IMPAIRMENTS: decreased strength, improper body mechanics, postural dysfunction, and pain.   ACTIVITY LIMITATIONS: standing and sleeping  PARTICIPATION LIMITATIONS:   PERSONAL FACTORS: Age and Profession are also affecting patient's functional outcome.   REHAB POTENTIAL: Fair    CLINICAL DECISION MAKING: Stable/uncomplicated  EVALUATION COMPLEXITY: Low   GOALS: Goals reviewed with patient? Yes  SHORT TERM GOALS: Target date: 01/08/2023  Pt will be independent with her initial HEP to decrease R LE pain and paresthesia, improve ability to stand and tolerate sleeping positions more comfortably.  Baseline:Pt has started her initial HEP (12/21/2022);  01/20/2023- Patient able to verbalize good understanding and compliance with current HEP.  Goal status: MET  LONG TERM GOALS: Target date: 03/17/2023  Pt will have a decrease in R LE pain to 0/10 at most to promote ability to stand and sleep more comfortably.  Baseline: 2/10 at most for the past 3 weeks (12/21/2022); 01/20/2023= reports pulling 1/10 on right lower lateral leg Goal status: Progressing 12/19: 12/19: 0/10 pain  MET  2.  Pt will improve B hip extension and abduction strength by at least 1/2 MMT grade to promote ability to tolerate standing more comfortably for her R LE.  Baseline:  MMT Right eval Right 01/20/2023 Left eval Left  01/20/2023  Hip extension  4 (with R foot tingling initially) 4+ 4- 4+  Hip abduction 4- with R lateral leg and anterior lateral foot pain L5 dermatome); R hip hike compensation  4 4- 4  Extensor hallucis longus NT 5 NT 3+   Goal status: PROGRESSING  3.  Pt will improve her lumbar spine FOTO by at least 10 points as a demonstration of improved function.  Baseline: Lumbar spine FOTO 72 (12/21/2022); 01/20/2023= Will retest next session 12/19: 74% Goal status: Progress   4.  Patient will transition from formal skilled PT interventions to home/gym based exercise program with good knowledge of safe technique with basic exercises to avoid any further low back symptoms  Baseline: Patient not participating in any normal gym exercises due to fear of increasing her radicular symptoms 12/19: progressing gym program   Goal status: Ongoing   PLAN:  PT FREQUENCY: 1-2x/week  PT DURATION: 3 weeks  PLANNED INTERVENTIONS: 97110-Therapeutic exercises, 97530- Therapeutic activity, 97112- Neuromuscular re-education, 97140- Manual therapy, [id number]- Aquatic Therapy, 97014- Electrical stimulation (unattended), [id number]- Traction (mechanical), 02966- Ionotophoresis 4mg /ml Dexamethasone , Patient/Family education, Dry Needling, Joint mobilization, and Spinal  mobilization.  PLAN FOR NEXT SESSION: *avoid extension/rotation due to active neurologic tissue involvement, recently seen 4mm anterolisthesis. Continue with LE and core strengthening.  4:38 PM, 02/16/23    Keah Lamba  Leopoldo, PT [facility]  Outpatient Physical Therapy- [facility] [phone]  , 4:38 PM

## 2023-02-16 ENCOUNTER — Ambulatory Visit: Payer: 59

## 2023-02-16 DIAGNOSIS — M6281 Muscle weakness (generalized): Secondary | ICD-10-CM | POA: Diagnosis not present

## 2023-02-16 DIAGNOSIS — M5431 Sciatica, right side: Secondary | ICD-10-CM | POA: Diagnosis not present

## 2023-02-16 DIAGNOSIS — M5416 Radiculopathy, lumbar region: Secondary | ICD-10-CM | POA: Diagnosis not present

## 2023-02-18 ENCOUNTER — Other Ambulatory Visit: Payer: Self-pay | Admitting: Internal Medicine

## 2023-02-18 ENCOUNTER — Other Ambulatory Visit: Payer: Self-pay

## 2023-02-18 DIAGNOSIS — Z1231 Encounter for screening mammogram for malignant neoplasm of breast: Secondary | ICD-10-CM

## 2023-03-08 ENCOUNTER — Ambulatory Visit: Payer: Commercial Managed Care - PPO | Attending: Nurse Practitioner

## 2023-03-08 DIAGNOSIS — M5431 Sciatica, right side: Secondary | ICD-10-CM | POA: Diagnosis not present

## 2023-03-08 DIAGNOSIS — M6281 Muscle weakness (generalized): Secondary | ICD-10-CM

## 2023-03-08 DIAGNOSIS — M5416 Radiculopathy, lumbar region: Secondary | ICD-10-CM | POA: Diagnosis not present

## 2023-03-08 NOTE — Therapy (Signed)
OUTPATIENT PHYSICAL THERAPY TREATMENT     Patient Name: Heather Watson MRN: [id number] DOB:[birth year], 68 y.o., female Today's Date: 03/08/2023  END OF SESSION:  PT End of Session - 03/08/23 1444     Visit Number 13    Number of Visits 28    Date for PT Re-Evaluation 03/17/23    Authorization Type [employer]    PT Start Time 1445    PT Stop Time 1529    PT Time Calculation (min) 44 min    Activity Tolerance Patient tolerated treatment well;No increased pain    Behavior During Therapy WFL for tasks assessed/performed                       Past Medical History:  Diagnosis Date   Arthritis    Right knee   Family history of breast cancer    Gallstones    UTI (urinary tract infection)    Past Surgical History:  Procedure Laterality Date   BROW LIFT Bilateral 06/06/2021   Procedure: BLEPHAROPLASTY UPPER EYELID; W/ EXCESS SKIN BLEPHAROPTOSIS REPAIR; RESECT EX BILATERAL;  Surgeon: Imagene Riches, MD;  Location: [facility];  Service: Ophthalmology;  Laterality: Bilateral;  wants early arrival   COLONOSCOPY WITH PROPOFOL N/A 05/19/2016   Procedure: COLONOSCOPY WITH PROPOFOL;  Surgeon: Midge Minium, MD;  Location: [facility] END[facility];  Service: Endoscopy;  Laterality: N/A;   COLONOSCOPY WITH PROPOFOL N/A 07/08/2021   Procedure: COLONOSCOPY WITH PROPOFOL;  Surgeon: Midge Minium, MD;  Location: [facility] END[facility]COPY;  Service: Endoscopy;  Laterality: N/A;  1ST CASE< PLEASE   Patient Active Problem List   Diagnosis Date Noted   Intervertebral lumbar disc disorder with myelopathy, lumbar region 01/04/2023   Radiculopathy of lumbosacral region 01/04/2023   Weakness of foot, left 01/04/2023   Sciatica, right side 11/10/2022   Special screening for malignant neoplasms, colon    Genetic testing 04/03/2020   Family history of breast cancer    Lumbar back pain 12/15/2019   ASCUS with positive high risk HPV cervical 01/29/2019   Vitamin D deficiency 01/17/2017    Colon cancer screening    Family history of colonic polyps 01/19/2016   History of meniscal tear 02/02/2014   Postmenopausal atrophic vaginitis 01/08/2013   Encounter for preventive health examination 01/08/2013   Menopause 05/14/2012   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   H/O thyroid cyst 05/14/2012    PCP: Sherlene Shams, MD  REFERRING PROVIDER: Bethanie Dicker, NP  REFERRING DIAG: M54.31 (ICD-10-CM) - Sciatica, right side M54.50 (ICD-10-CM) - Lumbar back pain  Rationale for Evaluation and Treatment: Rehabilitation  THERAPY DIAG:  Muscle weakness (generalized)  Radiculopathy, lumbar region  Sciatica, right side  ONSET DATE: 5 weeks ago  SUBJECTIVE:  SUBJECTIVE STATEMENT: Patient is returning from her trip. Reports only aggravation came from dancing.    PERTINENT HISTORY:   Low back pain with R sciatca. Pt states not having low back pain. Has R L 5 dermatome symptoms which started about 5 weeks ago, gradual onset, unknown mechanism of injury. Has had R LE pain 5 times in her life. Usually gone after 4 days. Pain was bad for about 2 weeks when recent pain begain. Pain is usually at night: Pt sleeps on her R side, then on her back, then on her L side, then on her stomach. Pt also has pain R anterior ankle joint. Pt also feels R lateral foot pain. Pt also felt tingling in her 2nd and 3rd toes. Pt currently has tingling at the dorsal and plantar surface of her R foot. The pain in R L5 dermatome is gone, now pain is just mostly on her R dorsal and plantar foot. Exercises 4-5 times a week.   PAIN:  Are you having pain? No pain today upon arrival    PRECAUTIONS: None  RED FLAGS: Bowel or bladder incontinence: No and Cauda equina syndrome: No   WEIGHT BEARING RESTRICTIONS: No  FALLS:  Has  patient fallen in last 6 months? 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   NEXT MD VISIT: None   OBJECTIVE:  Note: Objective measures were completed at Evaluation unless otherwise noted.  DIAGNOSTIC FINDINGS:  MRI 12/30/22: IMPRESSION: 1. Lumbar spondylosis as outlined within the body of the report and with findings most notably as follows. 2. At L4-L5, there is a disc bulge. Superimposed moderate-sized central/left subarticular disc extrusion. Caudal migration of the left subarticular component of the disc extrusion. Mild-to-moderate facet arthropathy with ligamentum flavum hypertrophy. The disc extrusion contributes to multifactorial severe left subarticular stenosis, and encroaches upon the descending left L5 nerve root. Also at this level, there is multifactorial severe right subarticular stenosis (with likely impingement of the descending right L5 nerve root) and moderate-to-severe central canal stenosis. Bilateral neural foraminal narrowing (mild right, mild/moderate left). 3. At L3-L4, there is 4 mm grade 1 anterolisthesis. Multifactorial mild bilateral subarticular and mild-to-moderate central canal narrowing. Mild relative right neural foraminal narrowing. 4. At T12-L1, a broad-based left subarticular/foraminal disc protrusion minimally effaces the ventrolateral thecal sac. It also results in moderate left neural foraminal narrowing, contacting (and mildly deforming) the undersurface of the exiting left T12 nerve root. 5. No more than mild relative spinal canal or neural foraminal narrowing at the remaining lumbar levels. 6. Levocurvature of the lumbar spine. 7. Cholecystolithiasis.  PATIENT  SURVEYS:  FOTO Lumbar spine FOTO 72   LUMBAR ROM:   AROM eval  Flexion full  Extension WFL with R L5/L1 dermatome symptoms  Right lateral flexion WFL with R lateral foot (L5 dermatome symptoms)  Left lateral flexion WFL  Right rotation [facility] with R lateral foot symptoms (L5)  Left rotation WFL with slight R L5 symptoms in foot.    (Blank rows = not tested)   LOWER EXTREMITY MMT:    MMT Right eval Left eval  Hip flexion 4 4-  Hip extension 4 (with R foot tingling initially) 4-  Hip abduction 4- with R lateral leg and anterior lateral foot pain  L5 dermatome); R hip hike compensation  4-  Hip adduction    Hip internal rotation    Hip external rotation    Knee flexion 5 5  Knee extension 5 5  Ankle dorsiflexion    Ankle plantarflexion    Ankle inversion    Ankle eversion     (Blank rows = not tested)   TODAY'S TREATMENT:                                                                                                                              DATE: 03/08/23  TherEx: Bear quadruped 3x20 seconds Bird dog 10x each side Posterior pelvic tilt 5x 30 seconds  Heel taps at 90 90 Standing marches with 5lb DB 10x each side  Core review: see below:  Access Code: [email or web address] URL: [email or web address] Date: 03/08/2023 Prepared by: Precious Bard  Exercises - Hooklying Transversus Abdominis Palpation  - 1 x daily - 7 x weekly - 2 sets - 5 reps - 30 hold - Supine 90/90 Abdominal Bracing  - 1 x daily - 7 x weekly - 2 sets - 10 reps - 5 hold - Bird Dog  - 1 x daily - 7 x weekly - 2 sets - 10 reps - 5 hold - Standing Shoulder Single Arm PNF D2 Flexion with Anchored Resistance  - 1 x daily - 7 x weekly - 2 sets - 10 reps - 5 hold   PATIENT EDUCATION:  Education details: there-ex technique Person educated: Patient Education method: Explanation, Demonstration, Tactile cues, and Verbal cues Education comprehension: verbalized understanding and returned  demonstration  HOME EXERCISE PROGRAM: Access Code: [email or web address] URL: [email or web address] Date: 01/28/2023 Prepared by: Precious Bard  Exercises - Seated Toe Towel Scrunches  - 1 x daily - 7 x weekly - 2 sets - 10 reps - 5 hold - Toe Yoga - Alternating Great Toe and Lesser Toe Extension  - 1 x daily - 7 x weekly - 2 sets - 10 reps - 5 hold - Seated Ankle Alphabet  - 1 x daily - 5 x weekly - 2 sets - 1 reps - 5 hold - Supine 90/90 Sciatic Nerve Glide with Knee Flexion/Extension  - 1 x daily - 5 x weekly - 2 sets - 5 reps - 5 hold - Big toe mobilization  - 1 x daily - 7 x weekly - 2 sets - 2 reps - 30 hold - Full Leg Press  - 1 x daily - 2-3 x weekly - 3-4 sets - 10-12 reps - 5 hold - Hamstring Curl with Weight Machine  - 1 x daily - 2-3 x weekly - 3 sets - 10 reps - 5 hold - Knee Extension with Weight Machine  - 1 x daily - 2-3 x weekly - 3 sets - 10 reps - 5 hold   Access Code: [email or web address] URL: [email or web address] Date: 02/04/2023 Prepared by: Precious Bard  Exercises - Hooklying Transversus Abdominis Palpation  -  1 x daily - 3 x weekly - 2 sets - 10 reps - 5 hold - Supine March with Posterior Pelvic Tilt  - 1 x daily - 3 x weekly - 2 sets - 10 reps - 5 hold - Dead Bug with Swiss Ball  - 1 x daily - 3 x weekly - 2 sets - 10 reps - 5 hold - Prone Plank Elbows on Ball  - 1 x daily - 3 x weekly - 2 sets - 2 reps - 30 hold - Standing Anti-Rotation Press with Anchored Resistance  - 1 x daily - 3 x weekly - 2 sets - 10 reps - 5 hold   HEP updated Access Code: [email or web address] URL: [email or web address] Date: 01/26/2023 Prepared by: Temple Pacini  Exercises - Mini Squat  - 2 x daily - 5-6 x weekly - 3 sets - 10 reps - Heel Toe Raises with Counter Support  - 2 x daily - 7 x weekly - 2 sets - 10 reps -written instruction treadmill walking 5 min daily, instruction verbal for moderate walking speed, written instruction to use UE support with  intervention  Access Code: [email or web address] URL: [email or web address] Date: 01/21/2023 Prepared by: Maureen Ralphs  Exercises - Toe Yoga - Alternating Great Toe and Lesser Toe Extension  - 1 x daily - 3 sets - 10 reps      EVAL: HEP discontinued due to unfavorable symptoms response ----------------------------------------- Access Code: [email or web address] URL: [email or web address] Date: 12/28/2022 Prepared by: Precious Bard  - Supine 90/90 Sciatic Nerve Glide with Knee Flexion/Extension  - 1 x daily - 7 x weekly - 2 sets - 5 reps - 5 hold  Updated 01/12/23:     ASSESSMENT:  CLINICAL IMPRESSION:  Patient has returned to PT after trip with daughter. Continuation of core activation techniques tolerated well, HEP printed out of progression. Patient educated on braces for when she needs to bend and twist. Pt will benefit from skilled physical therapy services to address the aforementioned deficits.   OBJECTIVE IMPAIRMENTS: decreased strength, improper body mechanics, postural dysfunction, and pain.   ACTIVITY LIMITATIONS: standing and sleeping  PARTICIPATION LIMITATIONS:   PERSONAL FACTORS: Age and Profession are also affecting patient's functional outcome.   REHAB POTENTIAL: Fair    CLINICAL DECISION MAKING: Stable/uncomplicated  EVALUATION COMPLEXITY: Low   GOALS: Goals reviewed with patient? Yes  SHORT TERM GOALS: Target date: 01/08/2023  Pt will be independent with her initial HEP to decrease R LE pain and paresthesia, improve ability to stand and tolerate sleeping positions more comfortably.  Baseline:Pt has started her initial HEP (12/21/2022); 01/20/2023- Patient able to verbalize good understanding and compliance with current HEP.  Goal status: MET  LONG TERM GOALS: Target date: 03/17/2023  Pt will have a decrease in R LE pain to 0/10 at most to promote ability to stand and sleep more comfortably.  Baseline: 2/10 at most for the past 3 weeks  (12/21/2022); 01/20/2023= reports "pulling" 1/10 on right lower lateral leg Goal status: Progressing 12/19: 12/19: 0/10 pain  MET  2.  Pt will improve B hip extension and abduction strength by at least 1/2 MMT grade to promote ability to tolerate standing more comfortably for her R LE.  Baseline:  MMT Right eval Right 01/20/2023 Left eval Left  01/20/2023  Hip extension 4 (with R foot tingling initially) 4+ 4- 4+  Hip abduction 4- with R lateral leg and anterior lateral foot pain L5 dermatome); R hip hike compensation  4 4- 4  Extensor hallucis longus NT  5 NT 3+   Goal status: PROGRESSING  3.  Pt will improve her lumbar spine FOTO by at least 10 points as a demonstration of improved function.  Baseline: Lumbar spine FOTO 72 (12/21/2022); 01/20/2023= Will retest next session 12/19: 74% Goal status: Progress   4.  Patient will transition from formal skilled PT interventions to home/gym based exercise program with good knowledge of safe technique with basic exercises to avoid any further low back symptoms  Baseline: Patient not participating in any normal gym exercises due to fear of increasing her radicular symptoms 12/19: progressing gym program   Goal status: Ongoing   PLAN:  PT FREQUENCY: 1-2x/week  PT DURATION: 3 weeks  PLANNED INTERVENTIONS: 97110-Therapeutic exercises, 97530- Therapeutic activity, 97112- Neuromuscular re-education, 97140- Manual therapy, [id number]- Aquatic Therapy, 97014- Electrical stimulation (unattended), [id number]- Traction (mechanical), 40347- Ionotophoresis 4mg /ml Dexamethasone, Patient/Family education, Dry Needling, Joint mobilization, and Spinal mobilization.  PLAN FOR NEXT SESSION: *avoid extension/rotation due to active neurologic tissue involvement, recently seen 4mm anterolisthesis. Continue with LE and core strengthening.  4:26 PM, 03/08/23    Precious Bard, PT [facility]  Outpatient Physical Therapy- [facility] [phone]  ,  4:26 PM

## 2023-03-09 ENCOUNTER — Ambulatory Visit: Payer: Commercial Managed Care - PPO

## 2023-03-10 ENCOUNTER — Ambulatory Visit
Admission: RE | Admit: 2023-03-10 | Discharge: 2023-03-10 | Disposition: A | Discharge status: Home / Self Care | Payer: Commercial Managed Care - PPO | Source: Ambulatory Visit | Attending: Internal Medicine | Admitting: Internal Medicine

## 2023-03-10 DIAGNOSIS — Z1231 Encounter for screening mammogram for malignant neoplasm of breast: Secondary | ICD-10-CM

## 2023-03-11 ENCOUNTER — Ambulatory Visit: Payer: Commercial Managed Care - PPO

## 2023-03-11 ENCOUNTER — Ambulatory Visit: Payer: Commercial Managed Care - PPO | Admitting: Neurosurgery

## 2023-03-11 ENCOUNTER — Encounter: Payer: Self-pay | Admitting: Internal Medicine

## 2023-03-16 ENCOUNTER — Ambulatory Visit: Payer: Commercial Managed Care - PPO

## 2023-03-16 DIAGNOSIS — M5431 Sciatica, right side: Secondary | ICD-10-CM

## 2023-03-16 DIAGNOSIS — M6281 Muscle weakness (generalized): Secondary | ICD-10-CM

## 2023-03-16 DIAGNOSIS — M5416 Radiculopathy, lumbar region: Secondary | ICD-10-CM

## 2023-03-16 NOTE — Therapy (Signed)
OUTPATIENT PHYSICAL THERAPY TREATMENT/ Discharge     Patient Name: Heather Watson MRN: [id number] DOB:Nov 04, 1955, 68 y.o., female Today's Date: 03/16/2023  END OF SESSION:              Past Medical History:  Diagnosis Date   Arthritis    Right knee   Family history of breast cancer    Gallstones    UTI (urinary tract infection)    Past Surgical History:  Procedure Laterality Date   BROW LIFT Bilateral 06/06/2021   Procedure: BLEPHAROPLASTY UPPER EYELID; W/ EXCESS SKIN BLEPHAROPTOSIS REPAIR; RESECT EX BILATERAL;  Surgeon: Imagene Riches, MD;  Location: [facility];  Service: Ophthalmology;  Laterality: Bilateral;  wants early arrival   COLONOSCOPY WITH PROPOFOL N/A 05/19/2016   Procedure: COLONOSCOPY WITH PROPOFOL;  Surgeon: Midge Minium, MD;  Location: [facility] END[facility];  Service: Endoscopy;  Laterality: N/A;   COLONOSCOPY WITH PROPOFOL N/A 07/08/2021   Procedure: COLONOSCOPY WITH PROPOFOL;  Surgeon: Midge Minium, MD;  Location: [facility] END[facility]COPY;  Service: Endoscopy;  Laterality: N/A;  1ST CASE< PLEASE   Patient Active Problem List   Diagnosis Date Noted   Intervertebral lumbar disc disorder with myelopathy, lumbar region 01/04/2023   Radiculopathy of lumbosacral region 01/04/2023   Weakness of foot, left 01/04/2023   Sciatica, right side 11/10/2022   Special screening for malignant neoplasms, colon    Genetic testing 04/03/2020   Family history of breast cancer    Lumbar back pain 12/15/2019   ASCUS with positive high risk HPV cervical 01/29/2019   Vitamin D deficiency 01/17/2017   Colon cancer screening    Family history of colonic polyps 01/19/2016   History of meniscal tear 02/02/2014   Postmenopausal atrophic vaginitis 01/08/2013   Encounter for preventive health examination 01/08/2013   Menopause 05/14/2012   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   H/O thyroid cyst 05/14/2012    PCP: Sherlene Shams, MD  REFERRING PROVIDER: Bethanie Dicker,  NP  REFERRING DIAG: M54.31 (ICD-10-CM) - Sciatica, right side M54.50 (ICD-10-CM) - Lumbar back pain  Rationale for Evaluation and Treatment: Rehabilitation  THERAPY DIAG:  No diagnosis found.  ONSET DATE: 5 weeks ago  SUBJECTIVE:                                                                                                                                                                                           SUBJECTIVE STATEMENT: Patient feels she is back to normal, no more complications from her back.    PERTINENT HISTORY:   Low back pain with R sciatca. Pt states not having low back pain. Has R L 5 dermatome symptoms  which started about 5 weeks ago, gradual onset, unknown mechanism of injury. Has had R LE pain 5 times in her life. Usually gone after 4 days. Pain was bad for about 2 weeks when recent pain begain. Pain is usually at night: Pt sleeps on her R side, then on her back, then on her L side, then on her stomach. Pt also has pain R anterior ankle joint. Pt also feels R lateral foot pain. Pt also felt tingling in her 2nd and 3rd toes. Pt currently has tingling at the dorsal and plantar surface of her R foot. The pain in R L5 dermatome is gone, now pain is just mostly on her R dorsal and plantar foot. Exercises 4-5 times a week.   PAIN:  Are you having pain? No pain today upon arrival    PRECAUTIONS: None  RED FLAGS: Bowel or bladder incontinence: No and Cauda equina syndrome: No   WEIGHT BEARING RESTRICTIONS: No  FALLS:  Has patient fallen in last 6 months? 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   NEXT MD VISIT: None   OBJECTIVE:  Note: Objective measures were completed at Evaluation  unless otherwise noted.  DIAGNOSTIC FINDINGS:  MRI 12/30/22: IMPRESSION: 1. Lumbar spondylosis as outlined within the body of the report and with findings most notably as follows. 2. At L4-L5, there is a disc bulge. Superimposed moderate-sized central/left subarticular disc extrusion. Caudal migration of the left subarticular component of the disc extrusion. Mild-to-moderate facet arthropathy with ligamentum flavum hypertrophy. The disc extrusion contributes to multifactorial severe left subarticular stenosis, and encroaches upon the descending left L5 nerve root. Also at this level, there is multifactorial severe right subarticular stenosis (with likely impingement of the descending right L5 nerve root) and moderate-to-severe central canal stenosis. Bilateral neural foraminal narrowing (mild right, mild/moderate left). 3. At L3-L4, there is 4 mm grade 1 anterolisthesis. Multifactorial mild bilateral subarticular and mild-to-moderate central canal narrowing. Mild relative right neural foraminal narrowing. 4. At T12-L1, a broad-based left subarticular/foraminal disc protrusion minimally effaces the ventrolateral thecal sac. It also results in moderate left neural foraminal narrowing, contacting (and mildly deforming) the undersurface of the exiting left T12 nerve root. 5. No more than mild relative spinal canal or neural foraminal narrowing at the remaining lumbar levels. 6. Levocurvature of the lumbar spine. 7. Cholecystolithiasis.  PATIENT SURVEYS:  FOTO Lumbar spine FOTO 72   LUMBAR ROM:   AROM eval  Flexion full  Extension WFL with R L5/L1 dermatome symptoms  Right lateral flexion WFL with R lateral foot (L5 dermatome symptoms)  Left lateral flexion WFL  Right rotation [facility] with R lateral foot symptoms (L5)  Left rotation WFL with slight R L5 symptoms in foot.    (Blank rows = not tested)   LOWER EXTREMITY MMT:    MMT Right eval Left eval  Hip flexion 4 4-  Hip  extension 4 (with R foot tingling initially) 4-  Hip abduction 4- with R lateral leg and anterior lateral foot pain L5 dermatome); R hip hike compensation  4-  Hip adduction    Hip internal rotation    Hip external rotation    Knee flexion 5 5  Knee extension 5 5  Ankle dorsiflexion  Ankle plantarflexion    Ankle inversion    Ankle eversion     (Blank rows = not tested)   TODAY'S TREATMENT:                                                                                                                              DATE: 03/16/23 Physical therapy treatment session today consisted of completing assessment of goals and administration of testing as demonstrated and documented in flow sheet, treatment, and goals section of this note. Addition treatments may be found below.   Review of HEP: education on progressions to perform.   PATIENT EDUCATION:  Education details: there-ex technique Person educated: Patient Education method: Explanation, Demonstration, Tactile cues, and Verbal cues Education comprehension: verbalized understanding and returned demonstration  HOME EXERCISE PROGRAM: Access Code: [email or web address] URL: [email or web address] Date: 01/28/2023 Prepared by: Precious Bard  Exercises - Seated Toe Towel Scrunches  - 1 x daily - 7 x weekly - 2 sets - 10 reps - 5 hold - Toe Yoga - Alternating Great Toe and Lesser Toe Extension  - 1 x daily - 7 x weekly - 2 sets - 10 reps - 5 hold - Seated Ankle Alphabet  - 1 x daily - 5 x weekly - 2 sets - 1 reps - 5 hold - Supine 90/90 Sciatic Nerve Glide with Knee Flexion/Extension  - 1 x daily - 5 x weekly - 2 sets - 5 reps - 5 hold - Big toe mobilization  - 1 x daily - 7 x weekly - 2 sets - 2 reps - 30 hold - Full Leg Press  - 1 x daily - 2-3 x weekly - 3-4 sets - 10-12 reps - 5 hold - Hamstring Curl with Weight Machine  - 1 x daily - 2-3 x weekly - 3 sets - 10 reps - 5 hold - Knee Extension with Weight Machine  - 1 x daily - 2-3  x weekly - 3 sets - 10 reps - 5 hold   Access Code: [email or web address] URL: [email or web address] Date: 02/04/2023 Prepared by: Precious Bard  Exercises - Hooklying Transversus Abdominis Palpation  - 1 x daily - 3 x weekly - 2 sets - 10 reps - 5 hold - Supine March with Posterior Pelvic Tilt  - 1 x daily - 3 x weekly - 2 sets - 10 reps - 5 hold - Dead Bug with Swiss Ball  - 1 x daily - 3 x weekly - 2 sets - 10 reps - 5 hold - Prone Plank Elbows on Ball  - 1 x daily - 3 x weekly - 2 sets - 2 reps - 30 hold - Standing Anti-Rotation Press with Anchored Resistance  - 1 x daily - 3 x weekly - 2 sets - 10 reps - 5 hold   HEP updated Access Code: [email or web address] URL: [email or web address] Date: 01/26/2023 Prepared by: Temple Pacini  Exercises - Mini Squat  - 2 x daily - 5-6 x  weekly - 3 sets - 10 reps - Heel Toe Raises with Counter Support  - 2 x daily - 7 x weekly - 2 sets - 10 reps -written instruction treadmill walking 5 min daily, instruction verbal for moderate walking speed, written instruction to use UE support with intervention  Access Code: [email or web address] URL: [email or web address] Date: 01/21/2023 Prepared by: Maureen Ralphs  Exercises - Toe Yoga - Alternating Great Toe and Lesser Toe Extension  - 1 x daily - 3 sets - 10 reps      EVAL: HEP discontinued due to unfavorable symptoms response ----------------------------------------- Access Code: [email or web address] URL: [email or web address] Date: 12/28/2022 Prepared by: Precious Bard  - Supine 90/90 Sciatic Nerve Glide with Knee Flexion/Extension  - 1 x daily - 7 x weekly - 2 sets - 5 reps - 5 hold  Updated 01/12/23:     ASSESSMENT:  CLINICAL IMPRESSION: Patient has returned to PLOF with no limitations. She has returned to gym program, though has not had time this past week. Review of HEP and advancement to programs performed this session. I will be happy to see patient again in the  future as needed.   OBJECTIVE IMPAIRMENTS: decreased strength, improper body mechanics, postural dysfunction, and pain.   ACTIVITY LIMITATIONS: standing and sleeping  PARTICIPATION LIMITATIONS:   PERSONAL FACTORS: Age and Profession are also affecting patient's functional outcome.   REHAB POTENTIAL: Fair    CLINICAL DECISION MAKING: Stable/uncomplicated  EVALUATION COMPLEXITY: Low   GOALS: Goals reviewed with patient? Yes  SHORT TERM GOALS: Target date: 01/08/2023  Pt will be independent with her initial HEP to decrease R LE pain and paresthesia, improve ability to stand and tolerate sleeping positions more comfortably.  Baseline:Pt has started her initial HEP (12/21/2022); 01/20/2023- Patient able to verbalize good understanding and compliance with current HEP.  Goal status: MET  LONG TERM GOALS: Target date: 03/17/2023  Pt will have a decrease in R LE pain to 0/10 at most to promote ability to stand and sleep more comfortably.  Baseline: 2/10 at most for the past 3 weeks (12/21/2022); 01/20/2023= reports "pulling" 1/10 on right lower lateral leg Goal status: Progressing 12/19: 12/19: 0/10 pain  MET  2.  Pt will improve B hip extension and abduction strength by at least 1/2 MMT grade to promote ability to tolerate standing more comfortably for her R LE.  Baseline:  MMT Right eval Right 01/20/2023 Left eval Left  01/20/2023  Hip extension 4 (with R foot tingling initially) 4+ 4- 4+  Hip abduction 4- with R lateral leg and anterior lateral foot pain L5 dermatome); R hip hike compensation  4 4- 4  Extensor hallucis longus NT 5 NT 3+   Goal status: PROGRESSING  3.  Pt will improve her lumbar spine FOTO by at least 10 points as a demonstration of improved function.  Baseline: Lumbar spine FOTO 72 (12/21/2022); 01/20/2023= Will retest next session 12/19: 74% Goal status: Deferred.   4.  Patient will transition from formal skilled PT interventions to home/gym based exercise  program with good knowledge of safe technique with basic exercises to avoid any further low back symptoms  Baseline: Patient not participating in any normal gym exercises due to fear of increasing her radicular symptoms 12/19: progressing gym program 1/28: returned to normal program  Goal status: MET   PLAN:  PT FREQUENCY: 1-2x/week  PT DURATION: 3 weeks  PLANNED INTERVENTIONS: 97110-Therapeutic exercises, 97530- Therapeutic activity, 97112- Neuromuscular re-education, 97140- Manual therapy, [id number]- Aquatic Therapy, 97014- Electrical stimulation (unattended), [id number]-  Traction (mechanical), 57322- Ionotophoresis 4mg /ml Dexamethasone, Patient/Family education, Dry Needling, Joint mobilization, and Spinal mobilization.  PLAN FOR NEXT SESSION: *avoid extension/rotation due to active neurologic tissue involvement, recently seen 4mm anterolisthesis. Continue with LE and core strengthening.  9:05 AM, 03/16/23    Precious Bard, PT [facility]  Outpatient Physical Therapy- [facility] [phone]  , 9:05 AM

## 2023-03-18 ENCOUNTER — Ambulatory Visit: Payer: Commercial Managed Care - PPO

## 2023-03-23 ENCOUNTER — Ambulatory Visit: Payer: Commercial Managed Care - PPO

## 2023-03-30 ENCOUNTER — Ambulatory Visit: Payer: Commercial Managed Care - PPO

## 2023-03-30 ENCOUNTER — Other Ambulatory Visit: Payer: Self-pay

## 2023-03-30 DIAGNOSIS — H04123 Dry eye syndrome of bilateral lacrimal glands: Secondary | ICD-10-CM | POA: Diagnosis not present

## 2023-03-30 MED ORDER — FLUOROMETHOLONE 0.1 % OP SUSP
1.0000 [drp] | Freq: Four times a day (QID) | OPHTHALMIC | 0 refills | Status: DC
  Filled 2023-03-30: qty 5, 25d supply, fill #0

## 2023-04-01 ENCOUNTER — Ambulatory Visit: Payer: Commercial Managed Care - PPO

## 2023-04-06 ENCOUNTER — Ambulatory Visit: Payer: Commercial Managed Care - PPO

## 2023-04-08 ENCOUNTER — Ambulatory Visit: Payer: Commercial Managed Care - PPO

## 2023-04-13 ENCOUNTER — Ambulatory Visit: Payer: Commercial Managed Care - PPO

## 2023-04-13 ENCOUNTER — Ambulatory Visit: Payer: Medicare Other | Admitting: Neurosurgery

## 2023-04-13 VITALS — BP 126/78 | Ht 64.0 in | Wt 128.0 lb

## 2023-04-13 DIAGNOSIS — M5126 Other intervertebral disc displacement, lumbar region: Secondary | ICD-10-CM

## 2023-04-13 DIAGNOSIS — M5417 Radiculopathy, lumbosacral region: Secondary | ICD-10-CM

## 2023-04-13 NOTE — Progress Notes (Signed)
 Referring Physician:  No referring provider defined for this encounter.  Primary Physician:  Sherlene Shams, MD  History of Present Illness: 04/13/2023 Heather Watson is doing very well.  She has no pain.  She has a small amount of numbness on the top of her left foot.  She has been discharged from physical therapy.  01/19/2023 Heather Watson is here today with a chief complaint of known disc herniation at L4-5.  She presented to the emergency department approximately 3 weeks ago with severe pain primarily down her left leg, though she did have some right leg symptoms.  She has not had any back pain.  On initial evaluation by my partner, she had a left foot drop.  She was offered surgical intervention at the time, but preferred to move forward with conservative management.  She started physical therapy.  She has had a good response to physical therapy.  She started on a prednisone taper as well as gabapentin, muscle relaxant, and Tylenol.  Her pain has substantially improved.  She is essentially without pain at this point.  She does still have some diminished sensation in her left greater than right lower legs.  Past Surgery: no prior spinal surgeries   I have utilized the care everywhere function in epic to review the outside records available from external health systems.   Progress Note from Ernestine Mcmurray, MD on 12/30/22:  History of Present Illness: 12/30/2022 Heather Watson is here today with a chief complaint of severe pain going down her left lower extremity.  She has minimal back pain.  She was originally feeling this since around September 2024.  However she has had a worsening of her exacerbation and now severe in her left leg.  She states that this can be 10 out of 10.  He can stop her from standing or sitting.  Can stop her from walking.  It is worse when she tries to get up from a sitting or laying position.  She has not found any significant relief.  She has  noticed mild weakness in left lower extremity.  No bowel or bladder dysfunction.  She has been working with physical therapy and chiropractor.  Her last chiropractic manipulation she felt a significant worsening of her left lower extremity pain.   Conservative measures: has seen a chiropractor  Physical therapy: currently participating in at [facility], her initial evaluation was on 12/21/22.  Multimodal medical therapy including regular antiinflammatories: baclofen, meloxicam, medrol dosepak, oxycodone Injections: has not received epidural steroid injections  Review of Systems:  A 10 point review of systems is negative, except for the pertinent positives and negatives detailed in the HPI.  Past Medical History: Past Medical History:  Diagnosis Date   Arthritis    Right knee   Family history of breast cancer    Gallstones    UTI (urinary tract infection)     Past Surgical History: Past Surgical History:  Procedure Laterality Date   BROW LIFT Bilateral 06/06/2021   Procedure: BLEPHAROPLASTY UPPER EYELID; W/ EXCESS SKIN BLEPHAROPTOSIS REPAIR; RESECT EX BILATERAL;  Surgeon: Imagene Riches, MD;  Location: [facility];  Service: Ophthalmology;  Laterality: Bilateral;  wants early arrival   COLONOSCOPY WITH PROPOFOL N/A 05/19/2016   Procedure: COLONOSCOPY WITH PROPOFOL;  Surgeon: Midge Minium, MD;  Location: [facility];  Service: Endoscopy;  Laterality: N/A;   COLONOSCOPY WITH PROPOFOL N/A 07/08/2021   Procedure: COLONOSCOPY WITH PROPOFOL;  Surgeon: Midge Minium, MD;  Location: [facility] END[facility];  Service: Endoscopy;  Laterality: N/A;  1ST CASE< PLEASE    Allergies: Allergies as of 04/13/2023 - Review Complete 04/13/2023  Allergen Reaction Noted   Iodine Rash and Itching 05/12/2012   Soy allergy (obsolete) Rash 07/14/2018    Medications:  Current Outpatient Medications:    ALPRAZolam (XANAX) 0.25 MG tablet, Take 1 tablet (0.25 mg total) by mouth 2 (two) times daily as needed for  anxiety. (Patient taking differently: Take 0.25 mg by mouth 2 (two) times daily as needed for anxiety. (Rare)), Disp: 30 tablet, Rfl: 0   Cholecalciferol (VITAMIN D-3) 125 MCG (5000 UT) TABS, Take by mouth daily., Disp: , Rfl:    diclofenac Sodium (VOLTAREN) 1 % GEL, Apply 2 g topically 4 (four) times daily., Disp: 350 g, Rfl: 2   ELDERBERRY PO, Take by mouth daily., Disp: , Rfl:    estradiol (ESTRACE) 0.1 MG/GM vaginal cream, Place 1 Applicatorful vaginally at bedtime. FOR 14 DAYS, then twice weekly thereafter, Disp: 42.5 g, Rfl: 12   magnesium gluconate (MAGONATE) 500 MG tablet, Take 800 mg by mouth daily., Disp: , Rfl:    Omega-3 1000 MG CAPS, Take 1 capsule by mouth daily., Disp: , Rfl:   Social History: Social History   Tobacco Use   Smoking status: Former    Current packs/day: 0.00    Types: Cigarettes    Quit date: 08/21/1981    Years since quitting: 41.6   Smokeless tobacco: Never  Vaping Use   Vaping status: Never Used  Substance Use Topics   Alcohol use: Yes    Comment: occasional   Drug use: No    Family Medical History: Family History  Problem Relation Age of Onset   Hypertension Mother    Arthritis Mother        osteoarthritis   Breast cancer Mother 110   Cancer Mother        stomach/intestine   Stroke Father    Hypertension Father    Cancer Sister        lymphoma   Hypertension Sister    Hypertension Brother    Heart attack Maternal Grandfather    Stroke Paternal Grandfather    Hypertension Brother    Breast cancer Maternal Aunt 40   Cancer Paternal Uncle        unk type    Physical Examination: Vitals:   04/13/23 1558  BP: 126/78    General: Patient is in no apparent distress. Attention to examination is appropriate.  Neck:   Supple.  Full range of motion.  Respiratory: Patient is breathing without any difficulty.   NEUROLOGICAL:     Awake, alert, oriented to person, place, and time.  Speech is clear and fluent.   Cranial Nerves: Pupils  equal round and reactive to light.  Facial tone is symmetric.  Facial sensation is symmetric. Shoulder shrug is symmetric. Tongue protrusion is midline.  There is no pronator drift.  Strength: Side Biceps Triceps Deltoid Interossei Grip Wrist Ext. Wrist Flex.  R 5 5 5 5 5 5 5   L 5 5 5 5 5 5 5    Side Iliopsoas Quads Hamstring PF DF EHL  R 5 5 5 5 5 5   L 5 5 5 5 5 5    Reflexes are 2+ and symmetric at the biceps, triceps, brachioradialis, 1+ patella and achilles.   Hoffman's is absent.   Bilateral upper and lower extremity sensation is intact to light touch with exception of diminished light touch L lateral calf and top of  foot..    No evidence of dysmetria noted.  Gait is normal.  SLR negative bilaterally   Medical Decision Making  Imaging: MRI L spine 12/30/2022 IMPRESSION: 1. Lumbar spondylosis as outlined within the body of the report and with findings most notably as follows. 2. At L4-L5, there is a disc bulge. Superimposed moderate-sized central/left subarticular disc extrusion. Caudal migration of the left subarticular component of the disc extrusion. Mild-to-moderate facet arthropathy with ligamentum flavum hypertrophy. The disc extrusion contributes to multifactorial severe left subarticular stenosis, and encroaches upon the descending left L5 nerve root. Also at this level, there is multifactorial severe right subarticular stenosis (with likely impingement of the descending right L5 nerve root) and moderate-to-severe central canal stenosis. Bilateral neural foraminal narrowing (mild right, mild/moderate left). 3. At L3-L4, there is 4 mm grade 1 anterolisthesis. Multifactorial mild bilateral subarticular and mild-to-moderate central canal narrowing. Mild relative right neural foraminal narrowing. 4. At T12-L1, a broad-based left subarticular/foraminal disc protrusion minimally effaces the ventrolateral thecal sac. It also results in moderate left neural foraminal  narrowing, contacting (and mildly deforming) the undersurface of the exiting left T12 nerve root. 5. No more than mild relative spinal canal or neural foraminal narrowing at the remaining lumbar levels. 6. Levocurvature of the lumbar spine. 7. Cholecystolithiasis.     Electronically Signed   By: Jackey Loge D.O.   On: 12/30/2022 11:06  I have personally reviewed the images and agree with the above interpretation.  Assessment and Plan: Heather Watson is a pleasant 68 y.o. female with resolved left foot drop likely due to L4-5 disc herniation.  She no longer has radicular pain.  She is completely recovered.  We discussed that she can resume normal activities.  Will see her back on an as-needed basis.   I spent a total of 15 minutes in this patient's care today. This time was spent reviewing pertinent records including imaging studies, obtaining and confirming history, performing a directed evaluation, formulating and discussing my recommendations, and documenting the visit within the medical record.      Thank you for involving me in the care of this patient.      Jacy Brocker K. Myer Haff MD, [facility] Neurosurgery

## 2023-04-15 ENCOUNTER — Ambulatory Visit: Payer: Commercial Managed Care - PPO

## 2023-04-20 ENCOUNTER — Ambulatory Visit: Payer: Commercial Managed Care - PPO

## 2023-04-22 ENCOUNTER — Ambulatory Visit: Payer: Commercial Managed Care - PPO

## 2023-04-27 ENCOUNTER — Ambulatory Visit: Payer: Commercial Managed Care - PPO

## 2023-04-29 ENCOUNTER — Ambulatory Visit: Payer: Commercial Managed Care - PPO

## 2023-05-04 ENCOUNTER — Ambulatory Visit: Payer: Commercial Managed Care - PPO

## 2023-05-06 ENCOUNTER — Ambulatory Visit: Payer: Commercial Managed Care - PPO

## 2023-05-11 ENCOUNTER — Ambulatory Visit: Payer: Commercial Managed Care - PPO

## 2023-05-11 DIAGNOSIS — H02883 Meibomian gland dysfunction of right eye, unspecified eyelid: Secondary | ICD-10-CM | POA: Diagnosis not present

## 2023-05-11 DIAGNOSIS — H47393 Other disorders of optic disc, bilateral: Secondary | ICD-10-CM | POA: Diagnosis not present

## 2023-05-11 DIAGNOSIS — H04123 Dry eye syndrome of bilateral lacrimal glands: Secondary | ICD-10-CM | POA: Diagnosis not present

## 2023-05-11 DIAGNOSIS — H43812 Vitreous degeneration, left eye: Secondary | ICD-10-CM | POA: Diagnosis not present

## 2023-05-11 DIAGNOSIS — H2513 Age-related nuclear cataract, bilateral: Secondary | ICD-10-CM | POA: Diagnosis not present

## 2023-07-22 ENCOUNTER — Telehealth: Payer: Self-pay

## 2023-07-22 DIAGNOSIS — Z8639 Personal history of other endocrine, nutritional and metabolic disease: Secondary | ICD-10-CM

## 2023-07-22 DIAGNOSIS — E538 Deficiency of other specified B group vitamins: Secondary | ICD-10-CM

## 2023-07-22 DIAGNOSIS — E782 Mixed hyperlipidemia: Secondary | ICD-10-CM

## 2023-07-22 DIAGNOSIS — E559 Vitamin D deficiency, unspecified: Secondary | ICD-10-CM

## 2023-07-22 NOTE — Telephone Encounter (Signed)
 Order have been entered.  Please call and schedule pt for a lab appointment prior to physical appointment on 02/07/24.  Thank you!!

## 2023-07-22 NOTE — Addendum Note (Signed)
 Addended by: Philippa Bray on: 07/22/2023 02:23 PM   Modules accepted: Orders

## 2023-07-22 NOTE — Telephone Encounter (Signed)
 Copied from CRM [id number]. Topic: Clinical - Request for Lab/Test Order >> Jul 22, 2023  8:39 AM Heather Watson wrote: Reason for CRM: Patient is requesting labs prior to her physical 02/07/24.

## 2023-08-11 DIAGNOSIS — K080 Exfoliation of teeth due to systemic causes: Secondary | ICD-10-CM | POA: Diagnosis not present

## 2023-08-14 IMAGING — MG MM DIGITAL SCREENING BILAT W/ TOMO AND CAD
8 series · 9 of 24 positions shown · non-contrast
Comparison: Previous exam(s).

CLINICAL DATA: Screening.

EXAM:
DIGITAL SCREENING BILATERAL MAMMOGRAM WITH TOMOSYNTHESIS AND CAD
TECHNIQUE: Bilateral screening digital craniocaudal and mediolateral oblique
mammograms were obtained. Bilateral screening digital breast
tomosynthesis was performed. The images were evaluated with
computer-aided detection.

[R CC synth-2D]
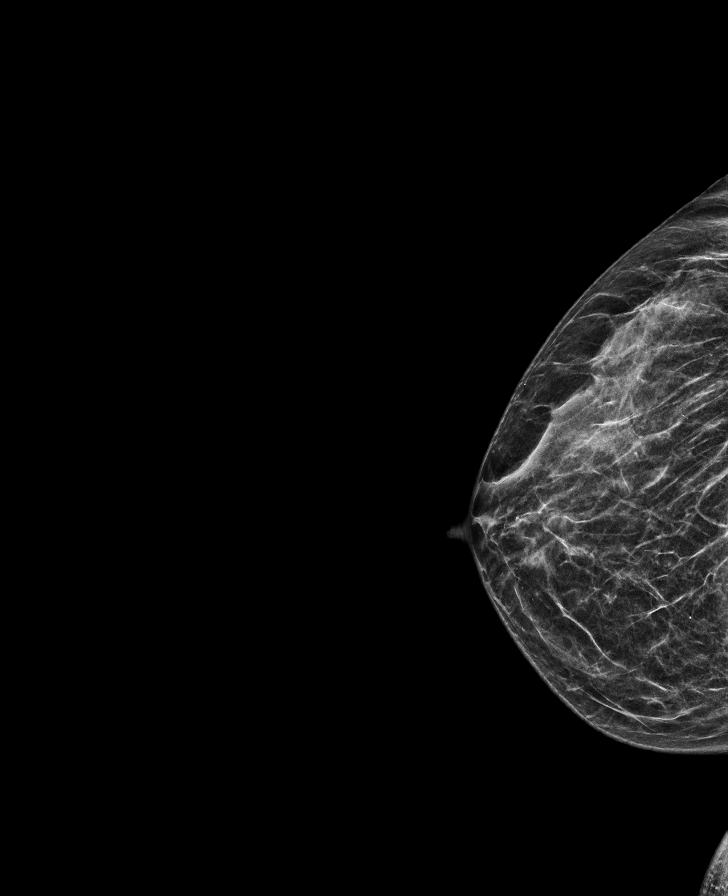

[R MLO synth-2D]
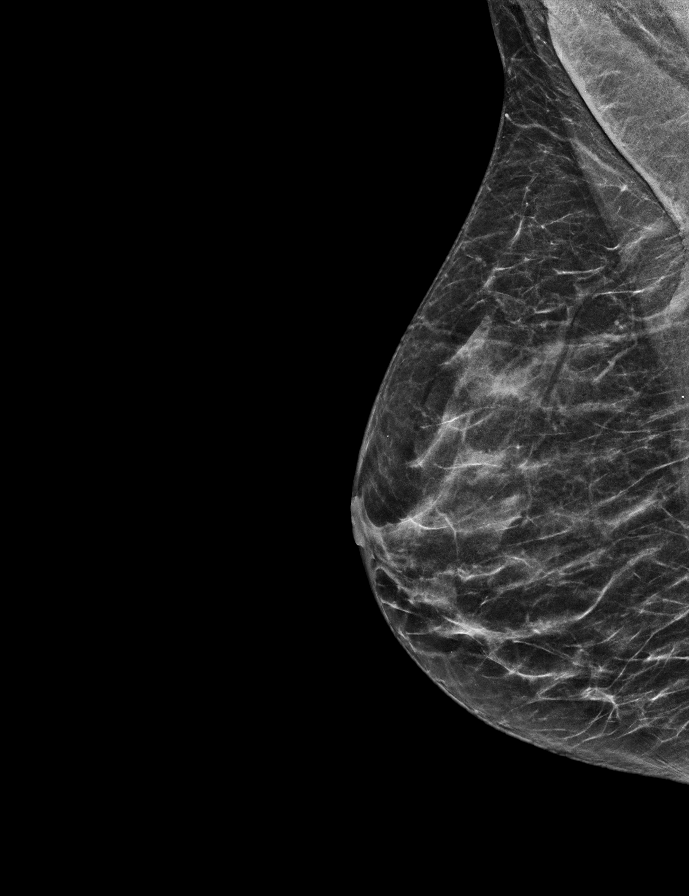

[L CC synth-2D]
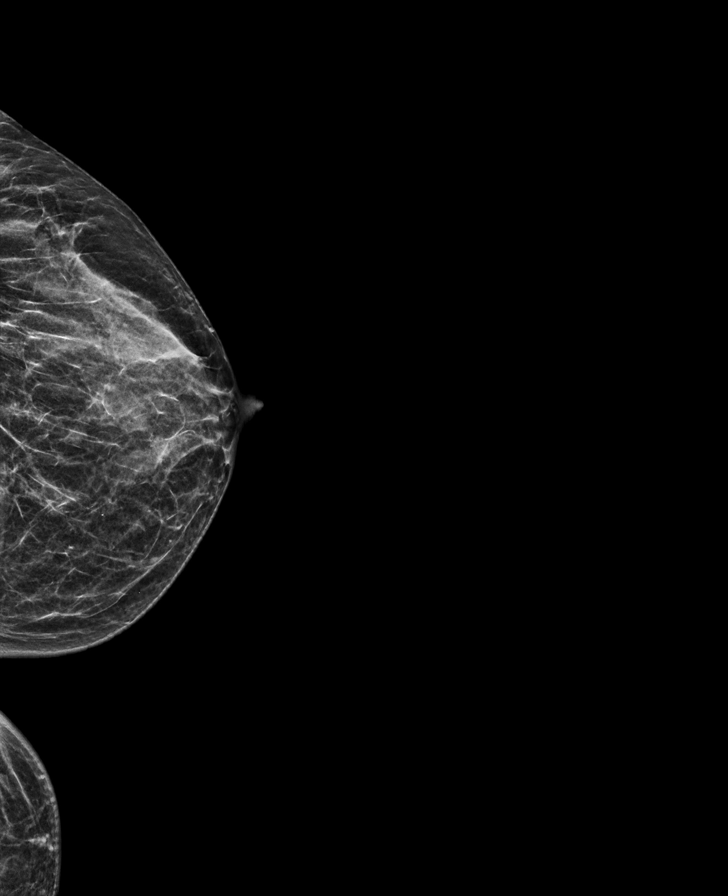

[L MLO synth-2D]
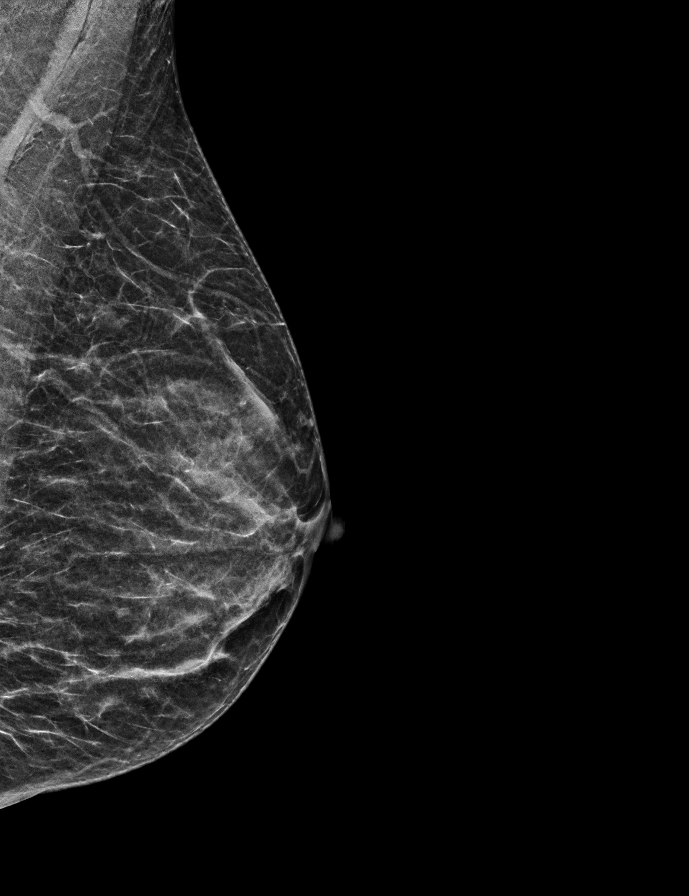

[L MLO tomo · 2 of 36 frames shown]
[frame 12/36]
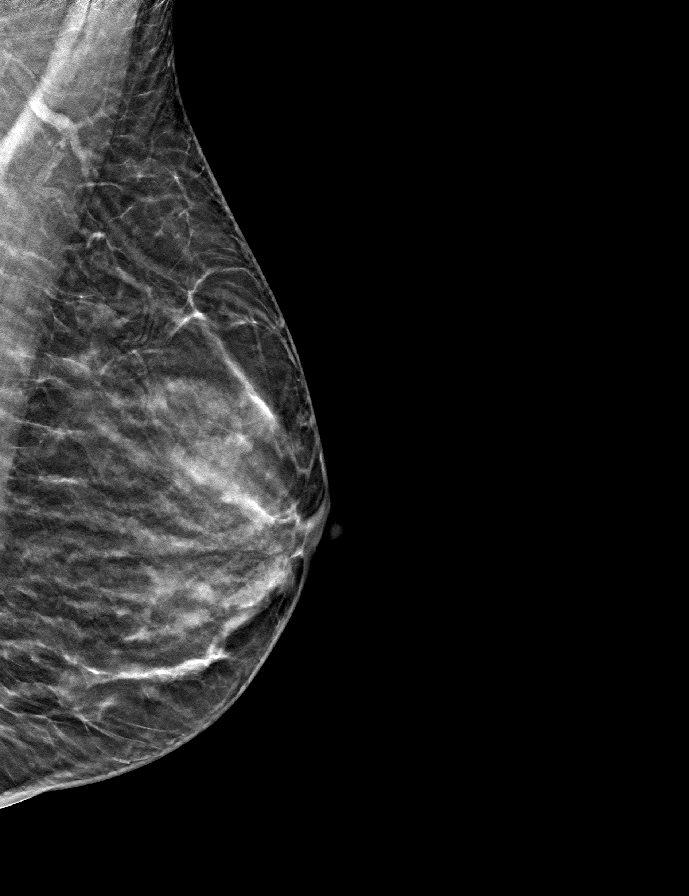
[frame 19/36]
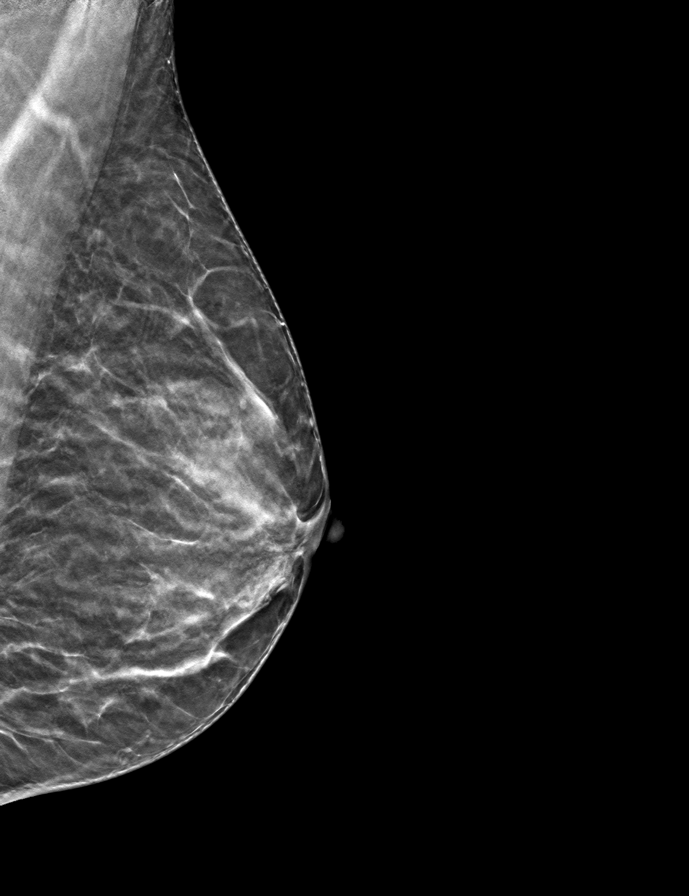

[R MLO tomo · tomo slice 25/48.0]
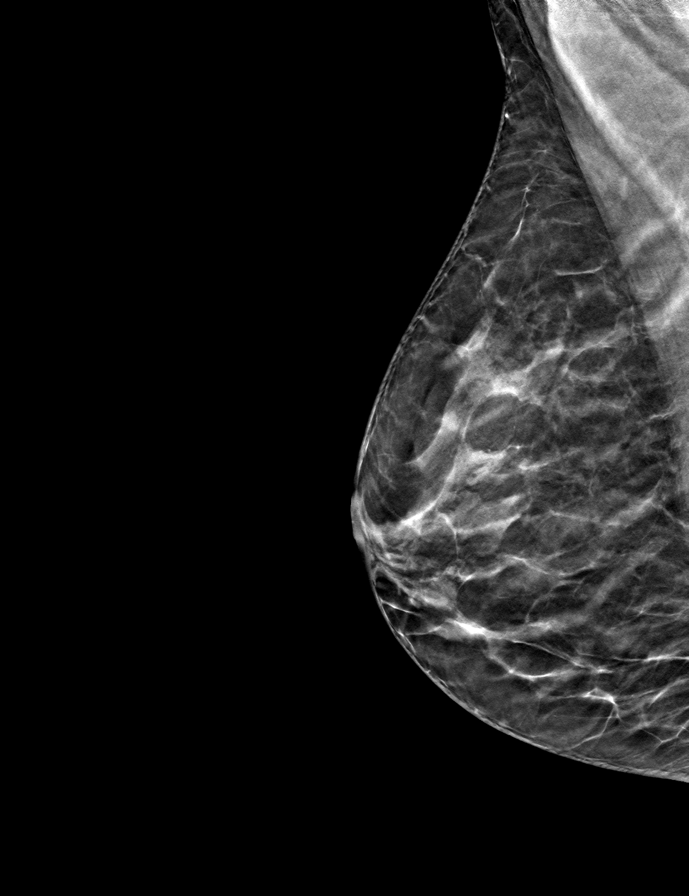

[R CC tomo · tomo slice 20/39.0]
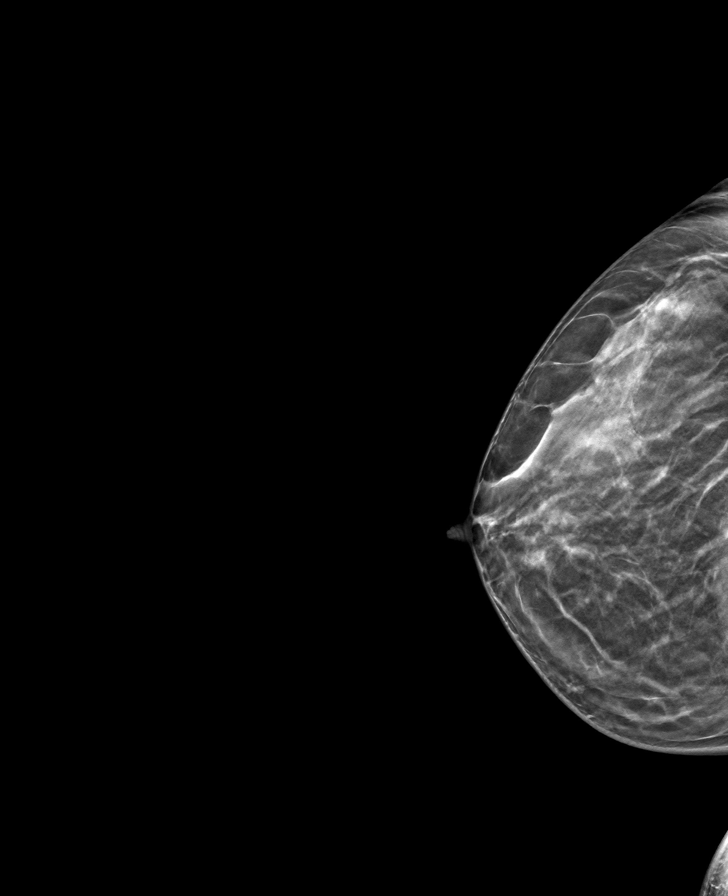

[L CC tomo · tomo slice 19/37.0]
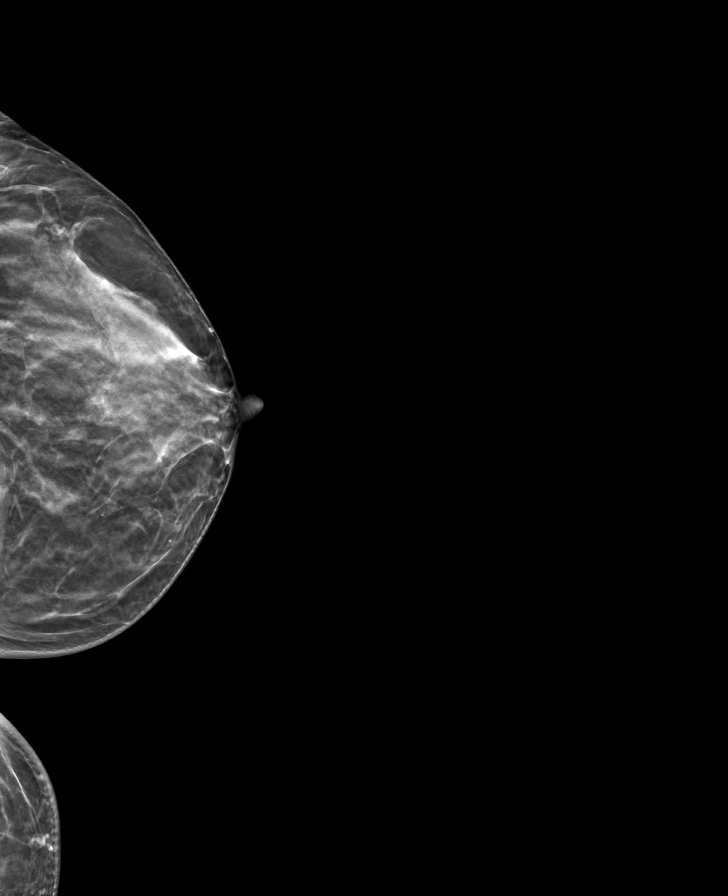

[9 of 24 positions shown; findings below may reference images not displayed]

ACR Breast Density Category c: The breast tissue is heterogeneously
dense, which may obscure small masses.
FINDINGS: There are no findings suspicious for malignancy.
IMPRESSION: No mammographic evidence of malignancy. A result letter of this
screening mammogram will be mailed directly to the patient.

RECOMMENDATION:
Screening mammogram in one year. (Code:Q3-W-BC3)

BI-RADS CATEGORY  1: Negative.

## 2023-09-13 ENCOUNTER — Encounter: Payer: Self-pay | Admitting: Internal Medicine

## 2023-09-14 ENCOUNTER — Other Ambulatory Visit: Payer: Self-pay | Admitting: Internal Medicine

## 2023-09-14 DIAGNOSIS — Z1239 Encounter for other screening for malignant neoplasm of breast: Secondary | ICD-10-CM

## 2023-09-20 ENCOUNTER — Encounter: Payer: Self-pay | Admitting: Internal Medicine

## 2023-09-28 ENCOUNTER — Ambulatory Visit: Attending: Internal Medicine | Admitting: Physical Therapy

## 2023-09-28 DIAGNOSIS — M546 Pain in thoracic spine: Secondary | ICD-10-CM | POA: Insufficient documentation

## 2023-09-28 DIAGNOSIS — M5416 Radiculopathy, lumbar region: Secondary | ICD-10-CM | POA: Insufficient documentation

## 2023-09-28 NOTE — Therapy (Signed)
 PT/OT/SLP Screening Form   Complaint:  Muscle spasm in L thoracic spine, started 2 months ago, went away and then came back ~5 days away Past Medical Hx:  Intermittent thoracic pain for years Injury Date: ~2 months ago Pain Scale: unrated   Hx (this occurrence):  Pt has hx of LBP, new incident of muscle spasms in thoracic spine.   States she was prescribed muscle relaxer from MD and is taking them primarily at night Helps if she takes it the whole week for 3 nights/week, but today only took Advil   Planning to travel out of town from Thursday to Tuesday.   Muscle spasms provoked by showering and reaching behind her back.      Assessment: No symptoms with shoulder overhead movement or shoulder abduction or shoulder IR and ER and all WNL AROM Been using heating pad at night Thoracic flexion/extension WNL with no symptoms Trigger point palpated along L thoracic and upper lumbar paraspinals and L latissimus dorsi Would benefit from more extensive Intercostal rib movement assessment   Recommendations:   Soft tissue mobilization Using massage chair in [employer] Heating pad  Sidelying Open Book Exercise Access Code: [email or web address] URL: [email or web address] Date: 09/28/2023 Prepared by: Connell Kiss  Exercises - Sidelying Open Book Thoracic Lumbar Rotation and Extension  - 1 x daily - 7 x weekly - 2 sets - 10 reps Standing soft mobilization using tennis ball against wall Continue scapular protraction stretch and shoulder adduction across body   Comments:    []  Patient would benefit from an MD referral [x]  Patient would benefit from a full PT/OT/ SLP evaluation and treatment. []  No intervention recommended at this time.    Connell Kiss, PT, DPT, NCS, CSRS Physical Therapist - [place]  [facility]  3:31 PM 09/28/23

## 2023-09-29 ENCOUNTER — Ambulatory Visit: Payer: Self-pay | Admitting: *Deleted

## 2023-09-29 ENCOUNTER — Other Ambulatory Visit: Payer: Self-pay

## 2023-09-29 MED ORDER — BACLOFEN 10 MG PO TABS
10.0000 mg | ORAL_TABLET | Freq: Three times a day (TID) | ORAL | 2 refills | Status: AC | PRN
  Filled 2023-09-29: qty 30, 10d supply, fill #0

## 2023-09-29 NOTE — Telephone Encounter (Signed)
 Pt is requesting a refill of baclofen . Historical medication. Previously prescribed by a different provider in November.   Pt stated that she has been having some back pain for the last several weeks. She has been taking tylenol  and ibuprofen with some relief so pt took a baclofen  that she had left over and it helped a lot more with the pain. Pt is living to go out town tomorrow and would like to see if she could get a refill.

## 2023-09-29 NOTE — Telephone Encounter (Signed)
Pt is aware medication has been sent in

## 2023-09-29 NOTE — Telephone Encounter (Signed)
  FYI Only or Action Required?: Action required by provider: medication refill request and if baclofen  from old Rx can be prescribed .  Patient was last seen in primary care on 02/05/2023 by Marylynn Verneita CROME, MD.  Called Nurse Triage reporting Back Pain.  Symptoms began several weeks ago.  Interventions attempted: OTC medications: tylenol  and ibuprofen .  Symptoms are: gradually worsening.  Triage Disposition: See PCP When Office is Open (Within 3 Days)  Patient/caregiver understands and will follow disposition?: No, wishes to speak with PCP                Copied from CRM #[id number]. Topic: Clinical - Red Word Triage >> Sep 29, 2023 12:44 PM Rosina BIRCH wrote: Red Word that prompted transfer to Nurse Triage: patient called stating she is having upper left back spasms with pain Reason for Disposition  [1] MODERATE back pain (e.g., interferes with normal activities) AND [2] present > 3 days  Answer Assessment - Initial Assessment Questions No available appt today as requested. Patient requested baclofen  for upper left back pain. Patient going out of town tomorrow and has been having worsening pain x 2 weeks. Took some of old Rx of baclofen  and helped with pain. Has tylenol  500 mg and ibuprofen 600 mg with some relief but does not last long. Please advise and send my chart message if needed. Recommended if pain worsens go to UC.     1. ONSET: When did the pain begin? (e.g., minutes, hours, days)     2 weeks ago  2. LOCATION: Where does it hurt? (upper, mid or lower back)     Left upper back pain  3. SEVERITY: How bad is the pain?  (e.g., Scale 1-10; mild, moderate, or severe)     6/10  4. PATTERN: Is the pain constant? (e.g., yes, no; constant, intermittent)      Intermittent  5. RADIATION: Does the pain shoot into your legs or somewhere else?     na 6. CAUSE:  What do you think is causing the back pain?      Not sure hx herniated disc 7. BACK OVERUSE:  Any  recent lifting of heavy objects, strenuous work or exercise?     na 8. MEDICINES: What have you taken so far for the pain? (e.g., nothing, acetaminophen , NSAIDS)     Tylenol  500 mg and ibuprofen 600 mg helps for limited time and then pain returns  9. NEUROLOGIC SYMPTOMS: Do you have any weakness, numbness, or problems with bowel/bladder control?     na 10. OTHER SYMPTOMS: Do you have any other symptoms? (e.g., fever, abdomen pain, burning with urination, blood in urine)       Pain taking breath in left upper back pain , starts when reaching with left arm, shoulder to bath pain starts  11. PREGNANCY: Is there any chance you are pregnant? When was your last menstrual period?       na  Protocols used: Back Pain-A-AH

## 2023-10-05 ENCOUNTER — Encounter: Payer: Self-pay | Admitting: Internal Medicine

## 2023-10-05 ENCOUNTER — Ambulatory Visit: Admitting: Internal Medicine

## 2023-10-05 VITALS — BP 132/84 | HR 74 | Temp 98.6°F | Ht 64.0 in | Wt 133.0 lb

## 2023-10-05 DIAGNOSIS — S239XXA Sprain of unspecified parts of thorax, initial encounter: Secondary | ICD-10-CM | POA: Diagnosis not present

## 2023-10-05 NOTE — Progress Notes (Signed)
 Subjective:    Patient ID: Heather Watson, female    DOB: Jun 24, 1955, 68 y.o.   MRN: [id number]  HPI Here due to back pain  Having muscle spasms again Usually gets it once or twice a year--and it goes away with NSAIDs and muscle relaxer Naproxen bid (440mg ) and tylenol  at times Baclofen  10 tid Hasn't improved  Seemed to come on after doing recommended stretching exercises  Pain is left paraspinal in lower thoracic and goes down to lumbar area No radiation to legs No arm or leg weakness---other than known herniated disc on left  No new tasks or lifting  Uses heating pad at night  Current Outpatient Medications on File Prior to Visit  Medication Sig Dispense Refill   ALPRAZolam  (XANAX ) 0.25 MG tablet Take 1 tablet (0.25 mg total) by mouth 2 (two) times daily as needed for anxiety. (Patient taking differently: Take 0.25 mg by mouth 2 (two) times daily as needed for anxiety. (Rare)) 30 tablet 0   baclofen  (LIORESAL ) 10 MG tablet Take 1 tablet (10 mg total) by mouth 3 (three) times daily as needed for muscle spasms. 30 tablet 2   Cholecalciferol (VITAMIN D -3) 125 MCG (5000 UT) TABS Take by mouth daily.     diclofenac  Sodium (VOLTAREN ) 1 % GEL Apply 2 g topically 4 (four) times daily. 350 g 2   ELDERBERRY PO Take by mouth daily.     estradiol  (ESTRACE ) 0.1 MG/GM vaginal cream Place 1 Applicatorful vaginally at bedtime. FOR 14 DAYS, then twice weekly thereafter 42.5 g 12   magnesium gluconate (MAGONATE) 500 MG tablet Take 800 mg by mouth daily.     Omega-3 1000 MG CAPS Take 1 capsule by mouth daily.     No current facility-administered medications on file prior to visit.    Allergies  Allergen Reactions   Iodine Rash and Itching    Contrast through IV   Soy Allergy (Obsolete) Rash    Past Medical History:  Diagnosis Date   Arthritis    Right knee   Family history of breast cancer    Gallstones    UTI (urinary tract infection)     Past Surgical History:  Procedure  Laterality Date   BROW LIFT Bilateral 06/06/2021   Procedure: BLEPHAROPLASTY UPPER EYELID; W/ EXCESS SKIN BLEPHAROPTOSIS REPAIR; RESECT EX BILATERAL;  Surgeon: Ashley Greig HERO, MD;  Location: [facility];  Service: Ophthalmology;  Laterality: Bilateral;  wants early arrival   COLONOSCOPY WITH PROPOFOL  N/A 05/19/2016   Procedure: COLONOSCOPY WITH PROPOFOL ;  Surgeon: Rogelia Copping, MD;  Location: [facility];  Service: Endoscopy;  Laterality: N/A;   COLONOSCOPY WITH PROPOFOL  N/A 07/08/2021   Procedure: COLONOSCOPY WITH PROPOFOL ;  Surgeon: Copping Rogelia, MD;  Location: [facility];  Service: Endoscopy;  Laterality: N/A;  1ST CASE< PLEASE    Family History  Problem Relation Age of Onset   Hypertension Mother    Arthritis Mother        osteoarthritis   Breast cancer Mother 67   Cancer Mother        stomach/intestine   Stroke Father    Hypertension Father    Cancer Sister        lymphoma   Hypertension Sister    Hypertension Brother    Heart attack Maternal Grandfather    Stroke Paternal Grandfather    Hypertension Brother    Breast cancer Maternal Aunt 40   Cancer Paternal Uncle        unk type  Social History   Socioeconomic History   Marital status: Divorced    Spouse name: Not on file   Number of children: Not on file   Years of education: Not on file   Highest education level: Not on file  Occupational History   Not on file  Tobacco Use   Smoking status: Former    Current packs/day: 0.00    Types: Cigarettes    Quit date: 08/21/1981    Years since quitting: 42.1   Smokeless tobacco: Never  Vaping Use   Vaping status: Never Used  Substance and Sexual Activity   Alcohol use: Yes    Comment: occasional   Drug use: No   Sexual activity: Yes    Birth control/protection: Post-menopausal  Other Topics Concern   Not on file  Social History Narrative   Not on file   Social Drivers of Health   Financial Resource Strain: Not on file  Food Insecurity: Not on  file  Transportation Needs: Not on file  Physical Activity: Not on file  Stress: Not on file  Social Connections: Unknown (01/22/2018)   Social Connection and Isolation Panel    Frequency of Communication with Friends and Family: Not on file    Frequency of Social Gatherings with Friends and Family: Not on file    Attends Religious Services: Not on file    Active Member of Clubs or Organizations: Not on file    Attends Banker Meetings: Not on file    Marital Status: Divorced  Intimate Partner Violence: Not At Risk (01/22/2018)   Humiliation, Afraid, Rape, and Kick questionnaire    Fear of Current or Ex-Partner: No    Emotionally Abused: No    Physically Abused: No    Sexually Abused: No   Review of Systems     Objective:   Physical Exam Musculoskeletal:     Comments: Normal ROM in arms No spine tenderness Tenderness along thoracic paraspinals ---only thoracic  Neurological:     Comments: Normal strength in arms and legs            Assessment & Plan:

## 2023-10-05 NOTE — Assessment & Plan Note (Addendum)
 She is doing the right things---just need patience  Can try the ibuprofen instead (600-800 tid) Baclofen  tid for now Heat--lidocaine  patch has helped She is okay with work

## 2023-10-05 NOTE — Telephone Encounter (Signed)
 Noted

## 2023-10-05 NOTE — Telephone Encounter (Unsigned)
 Copied from CRM #[id number]. Topic: Appointments - Appointment Scheduling >> Oct 05, 2023  9:24 AM Heather Watson wrote: Muscle spasms.. medication isn't working.. ( off and on it goes )  Can she be seen by different pcp?  Hers isn't available

## 2023-10-05 NOTE — Telephone Encounter (Signed)
 Pt is scheduled to see Dr Alphonsus Sias today.

## 2023-10-06 ENCOUNTER — Ambulatory Visit: Admitting: Nurse Practitioner

## 2023-10-07 ENCOUNTER — Telehealth: Payer: Self-pay

## 2023-10-07 NOTE — Telephone Encounter (Signed)
 Copied from CRM #[id number]. Topic: Clinical - Medication Question >> Oct 07, 2023 11:28 AM Viola FALCON wrote: Reason for CRM: Patient seen Dr. Jimmy 10/05/23 and would like higher dose of the lidocaine  patches sent to [facility]

## 2023-10-07 NOTE — Telephone Encounter (Signed)
 Spoke to pt. She does not have any flex money left. Will continue to get them OTC.

## 2023-10-09 DIAGNOSIS — T1511XA Foreign body in conjunctival sac, right eye, initial encounter: Secondary | ICD-10-CM | POA: Diagnosis not present

## 2023-10-09 DIAGNOSIS — H5711 Ocular pain, right eye: Secondary | ICD-10-CM | POA: Diagnosis not present

## 2023-10-10 ENCOUNTER — Ambulatory Visit
Admission: RE | Admit: 2023-10-10 | Discharge: 2023-10-10 | Disposition: A | Discharge status: Home / Self Care | Source: Ambulatory Visit | Attending: Internal Medicine | Admitting: Internal Medicine

## 2023-10-10 DIAGNOSIS — Z803 Family history of malignant neoplasm of breast: Secondary | ICD-10-CM | POA: Diagnosis not present

## 2023-10-10 DIAGNOSIS — Z1239 Encounter for other screening for malignant neoplasm of breast: Secondary | ICD-10-CM

## 2023-10-10 MED ORDER — GADOPICLENOL 0.5 MMOL/ML IV SOLN
6.0000 mL | Freq: Once | INTRAVENOUS | Status: AC | PRN
  Administered 2023-10-10: 6 mL via INTRAVENOUS

## 2023-10-12 ENCOUNTER — Ambulatory Visit: Payer: Self-pay | Admitting: Internal Medicine

## 2023-10-22 ENCOUNTER — Telehealth: Payer: Self-pay | Admitting: Internal Medicine

## 2023-10-22 NOTE — Telephone Encounter (Signed)
 Your appointment on 02/07/24 needs to be rescheduled. Dr Marylynn will be out of the office that week. Please call the office to reschedule.   Thank you.

## 2023-12-07 ENCOUNTER — Telehealth: Payer: Self-pay

## 2023-12-07 NOTE — Telephone Encounter (Signed)
 Spoke with pt and changed the appts around for pt.

## 2023-12-07 NOTE — Telephone Encounter (Signed)
 Copied from CRM #[id number]. Topic: Appointments - Scheduling Inquiry for Clinic >> Dec 07, 2023 10:48 AM Heather Watson wrote: Reason for CRM: Patient would like to be seen in November 22 or 23 for her physical. She wouldn't mind seeing another provider as well.

## 2023-12-24 ENCOUNTER — Encounter: Payer: Self-pay | Admitting: Internal Medicine

## 2023-12-24 DIAGNOSIS — Z13820 Encounter for screening for osteoporosis: Secondary | ICD-10-CM

## 2023-12-27 ENCOUNTER — Other Ambulatory Visit: Payer: Self-pay | Admitting: Internal Medicine

## 2023-12-27 DIAGNOSIS — Z1231 Encounter for screening mammogram for malignant neoplasm of breast: Secondary | ICD-10-CM

## 2024-01-24 ENCOUNTER — Ambulatory Visit
Admission: RE | Admit: 2024-01-24 | Discharge: 2024-01-24 | Disposition: A | Source: Ambulatory Visit | Attending: Internal Medicine | Admitting: Internal Medicine

## 2024-01-24 DIAGNOSIS — Z13820 Encounter for screening for osteoporosis: Secondary | ICD-10-CM

## 2024-01-24 DIAGNOSIS — M8589 Other specified disorders of bone density and structure, multiple sites: Secondary | ICD-10-CM | POA: Diagnosis not present

## 2024-01-25 ENCOUNTER — Ambulatory Visit: Payer: Self-pay | Admitting: Internal Medicine

## 2024-01-25 DIAGNOSIS — K080 Exfoliation of teeth due to systemic causes: Secondary | ICD-10-CM | POA: Diagnosis not present

## 2024-01-27 ENCOUNTER — Other Ambulatory Visit

## 2024-01-27 DIAGNOSIS — E782 Mixed hyperlipidemia: Secondary | ICD-10-CM

## 2024-01-27 DIAGNOSIS — E538 Deficiency of other specified B group vitamins: Secondary | ICD-10-CM | POA: Diagnosis not present

## 2024-01-27 DIAGNOSIS — Z8639 Personal history of other endocrine, nutritional and metabolic disease: Secondary | ICD-10-CM

## 2024-01-27 DIAGNOSIS — E559 Vitamin D deficiency, unspecified: Secondary | ICD-10-CM | POA: Diagnosis not present

## 2024-01-27 LAB — COMPREHENSIVE METABOLIC PANEL WITH GFR
ALT: 24 U/L (ref 0–35)
AST: 24 U/L (ref 0–37)
Albumin: 4.3 g/dL (ref 3.5–5.2)
Alkaline Phosphatase: 55 U/L (ref 39–117)
BUN: 18 mg/dL (ref 6–23)
CO2: 29 meq/L (ref 19–32)
Calcium: 9 mg/dL (ref 8.4–10.5)
Chloride: 103 meq/L (ref 96–112)
Creatinine, Ser: 0.57 mg/dL (ref 0.40–1.20)
GFR: 93.62 mL/min (ref >60.00)
Glucose, Bld: 90 mg/dL (ref 70–99)
Potassium: 4.1 meq/L (ref 3.5–5.1)
Sodium: 139 meq/L (ref 135–145)
Total Bilirubin: 0.6 mg/dL (ref 0.2–1.2)
Total Protein: 6.5 g/dL (ref 6.0–8.3)

## 2024-01-27 LAB — TSH: TSH: 1.34 u[IU]/mL (ref 0.35–5.50)

## 2024-01-27 LAB — LIPID PANEL
Cholesterol: 156 mg/dL (ref 0–200)
HDL: 69.1 mg/dL (ref >39.00)
LDL Cholesterol: 78 mg/dL (ref 0–99)
NonHDL: 86.84
Total CHOL/HDL Ratio: 2
Triglycerides: 46 mg/dL (ref 0.0–149.0)
VLDL: 9.2 mg/dL (ref 0.0–40.0)

## 2024-01-27 LAB — CBC WITH DIFFERENTIAL/PLATELET
Basophils Absolute: 0.1 K/uL (ref 0.0–0.1)
Basophils Relative: 2 % (ref 0.0–3.0)
Eosinophils Absolute: 0.1 K/uL (ref 0.0–0.7)
Eosinophils Relative: 3.5 % (ref 0.0–5.0)
HCT: 39.1 % (ref 36.0–46.0)
Hemoglobin: 13.3 g/dL (ref 12.0–15.0)
Lymphocytes Relative: 34.3 % (ref 12.0–46.0)
Lymphs Abs: 1.4 K/uL (ref 0.7–4.0)
MCHC: 34.1 g/dL (ref 30.0–36.0)
MCV: 91 fl (ref 78.0–100.0)
Monocytes Absolute: 0.5 K/uL (ref 0.1–1.0)
Monocytes Relative: 11.9 % (ref 3.0–12.0)
Neutro Abs: 2 K/uL (ref 1.4–7.7)
Neutrophils Relative %: 48.3 % (ref 43.0–77.0)
Platelets: 254 K/uL (ref 150.0–400.0)
RBC: 4.3 Mil/uL (ref 3.87–5.11)
RDW: 12.8 % (ref 11.5–15.5)
WBC: 4.2 K/uL (ref 4.0–10.5)

## 2024-01-27 LAB — VITAMIN B12: Vitamin B-12: 409 pg/mL (ref 211–911)

## 2024-01-27 LAB — VITAMIN D 25 HYDROXY (VIT D DEFICIENCY, FRACTURES): VITD: 48.39 ng/mL (ref 30.00–100.00)

## 2024-01-29 ENCOUNTER — Ambulatory Visit: Payer: Self-pay | Admitting: Internal Medicine

## 2024-01-31 ENCOUNTER — Encounter: Payer: Self-pay | Admitting: Internal Medicine

## 2024-01-31 ENCOUNTER — Telehealth: Payer: Self-pay

## 2024-01-31 ENCOUNTER — Ambulatory Visit (INDEPENDENT_AMBULATORY_CARE_PROVIDER_SITE_OTHER): Admitting: Internal Medicine

## 2024-01-31 VITALS — BP 118/74 | HR 73 | Temp 97.6°F | Ht 64.0 in | Wt 130.6 lb

## 2024-01-31 DIAGNOSIS — Z780 Asymptomatic menopausal state: Secondary | ICD-10-CM

## 2024-01-31 DIAGNOSIS — E782 Mixed hyperlipidemia: Secondary | ICD-10-CM

## 2024-01-31 DIAGNOSIS — M545 Low back pain, unspecified: Secondary | ICD-10-CM

## 2024-01-31 DIAGNOSIS — Z803 Family history of malignant neoplasm of breast: Secondary | ICD-10-CM

## 2024-01-31 DIAGNOSIS — Z1211 Encounter for screening for malignant neoplasm of colon: Secondary | ICD-10-CM

## 2024-01-31 DIAGNOSIS — Z87828 Personal history of other (healed) physical injury and trauma: Secondary | ICD-10-CM | POA: Diagnosis not present

## 2024-01-31 DIAGNOSIS — F419 Anxiety disorder, unspecified: Secondary | ICD-10-CM | POA: Diagnosis not present

## 2024-01-31 DIAGNOSIS — R87610 Atypical squamous cells of undetermined significance on cytologic smear of cervix (ASC-US): Secondary | ICD-10-CM | POA: Diagnosis not present

## 2024-01-31 DIAGNOSIS — Z0000 Encounter for general adult medical examination without abnormal findings: Secondary | ICD-10-CM

## 2024-01-31 DIAGNOSIS — R87810 Cervical high risk human papillomavirus (HPV) DNA test positive: Secondary | ICD-10-CM | POA: Diagnosis not present

## 2024-01-31 DIAGNOSIS — E559 Vitamin D deficiency, unspecified: Secondary | ICD-10-CM

## 2024-01-31 DIAGNOSIS — M8580 Other specified disorders of bone density and structure, unspecified site: Secondary | ICD-10-CM | POA: Diagnosis not present

## 2024-01-31 DIAGNOSIS — F5105 Insomnia due to other mental disorder: Secondary | ICD-10-CM | POA: Diagnosis not present

## 2024-01-31 DIAGNOSIS — E538 Deficiency of other specified B group vitamins: Secondary | ICD-10-CM

## 2024-01-31 DIAGNOSIS — Z8639 Personal history of other endocrine, nutritional and metabolic disease: Secondary | ICD-10-CM

## 2024-01-31 NOTE — Progress Notes (Unsigned)
 Patient ID: Heather Watson, female    DOB: August 19, 1955  Age: 68 y.o. MRN: [id number]  The patient is here for annual preventive examination and management of other chronic and acute problems.   The risk factors are reflected in the social history.   The roster of all physicians providing medical care to patient - is listed in the Snapshot section of the chart.   Activities of daily living:  The patient is 100% independent in all ADLs: dressing, toileting, feeding as well as independent mobility   Home safety : The patient has smoke detectors in the home. They wear seatbelts.  There are no unsecured firearms at home. There is no violence in the home.    There is no risks for hepatitis, STDs or HIV. There is no   history of blood transfusion. They have no travel history to infectious disease endemic areas of the world.   The patient has seen their dentist in the last six month. They have seen their eye doctor in the last year. The patinet  denies slight hearing difficulty with regard to whispered voices and some television programs.  They have deferred audiologic testing in the last year.  They do not  have excessive sun exposure. Discussed the need for sun protection: hats, long sleeves and use of sunscreen if there is significant sun exposure.    Diet: the importance of a healthy diet is discussed. They do have a healthy diet.   The benefits of regular aerobic exercise were discussed. The patient  exercises  3 to 5 days per week  for  60 minutes.    Depression screen: there are no signs or vegative symptoms of depression- irritability, change in appetite, anhedonia, sadness/tearfullness.   The following portions of the patient's history were reviewed and updated as appropriate: allergies, current medications, past family history, past medical history,  past surgical history, past social history  and problem list.   Visual acuity was not assessed per patient preference since the patient has  regular follow up with an  ophthalmologist. Hearing and body mass index were assessed and reviewed.    During the course of the visit the patient was educated and counseled about appropriate screening and preventive services including : fall prevention , diabetes screening, nutrition counseling, colorectal cancer screening, and recommended immunizations.    Chief Complaint:      Review of Symptoms  Patient denies headache, fevers, malaise, unintentional weight loss, skin rash, eye pain, sinus congestion and sinus pain, sore throat, dysphagia,  hemoptysis , cough, dyspnea, wheezing, chest pain, palpitations, orthopnea, edema, abdominal pain, nausea, melena, diarrhea, constipation, flank pain, dysuria, hematuria, urinary  Frequency, nocturia, numbness, tingling, seizures,  Focal weakness, Loss of consciousness,  Tremor, insomnia, depression, anxiety, and suicidal ideation.    Physical Exam:  BP 118/74   Pulse 73   Temp 97.6 F (36.4 C)   Ht 5' 4 (1.626 m)   Wt 130 lb 9.6 oz (59.2 kg)   LMP 04/12/2006   SpO2 98%   BMI 22.42 kg/m    Physical Exam  Assessment and Plan: There are no diagnoses linked to this encounter.  No follow-ups on file.  Verneita LITTIE Kettering, MD

## 2024-01-31 NOTE — Assessment & Plan Note (Addendum)
 She is screening with Annual bilateral screening breast MR followed by mammography  (MRI was done in August m mammogram scheduled in June)

## 2024-01-31 NOTE — Patient Instructions (Addendum)
 Algae cal is an excellent source of calcium to take for your supplement   Your protein shake is another source of calcium  YOUR bone density is unchanged and still in the osteopenia range.  We will repeat it in 2 years  PAP smear next year.   Your liver health is excellent and your CBC is NORMAL.    DON'T LET RHONA ruin your day!    May the Lord give you peace and joy during this holiday season, and may the promise of His return bring you comfort and hope for the future.  Regards,   Verneita Kettering, MD

## 2024-01-31 NOTE — Telephone Encounter (Signed)
 Patient saw Dr. Verneita Kettering today and we scheduled her physical in one year.  Patient states she would also like to have her labs drawn prior to her appointment with Dr. Kettering.  I scheduled the lab visit, but lab orders will need to be entered.

## 2024-02-01 DIAGNOSIS — M8580 Other specified disorders of bone density and structure, unspecified site: Secondary | ICD-10-CM | POA: Insufficient documentation

## 2024-02-01 NOTE — Assessment & Plan Note (Signed)
 She was referred to Dr Neomi after PAP smear in 2020 was abnormal.  Colposcopy was negative march 2021 and subsequent  PAPs smear have been normal, last one Jan 2023 .  Will repeat screening in 2025

## 2024-02-01 NOTE — Assessment & Plan Note (Signed)
 Occurred during a hiking fall in 2015.  She continues to exercise daily using low impact aerobics.

## 2024-02-01 NOTE — Assessment & Plan Note (Addendum)
 Considerable time was spent reviewing her labs ,which were excellent,  but due to [facility] representation, inflated trends and changes , which patient interpreted as signs of impending illness. Her anxiety  results in insomnia that is managed with infrequent use of alprazolam  .Refill history reviewed. The risks and benefits of benzodiazepine use were reviewed with patient today including excessive sedation leading to respiratory depression,  impaired thinking/driving, and addiction.  Patient was advised to avoid concurrent use with alcohol, to use medication only as needed and not to share with others  .

## 2024-02-01 NOTE — Assessment & Plan Note (Signed)

## 2024-02-01 NOTE — Assessment & Plan Note (Signed)
 T scores are unchanged by 2025 DEXA (-2.2_ repeat DEXA in 2027 .  Reviewed calcium and vitamin D  needs.

## 2024-02-01 NOTE — Telephone Encounter (Signed)
 I have pended labs for your approval. Pt would like to have them done prior to her physical next year.

## 2024-02-01 NOTE — Assessment & Plan Note (Signed)
Normal colonoscopy in 2011.   Repeat in 10 yrs.  Sister has NOT had a diagnosis of CA, only of a serrated adenoma

## 2024-02-01 NOTE — Assessment & Plan Note (Signed)
 Managed with stretching,  natural anti inflammatories , and exercise. Nonradiating

## 2024-02-03 ENCOUNTER — Other Ambulatory Visit

## 2024-02-07 ENCOUNTER — Encounter: Payer: 59 | Admitting: Internal Medicine

## 2024-02-14 ENCOUNTER — Encounter: Admitting: Internal Medicine

## 2024-02-25 ENCOUNTER — Other Ambulatory Visit: Payer: Self-pay

## 2024-02-29 ENCOUNTER — Other Ambulatory Visit: Payer: Self-pay

## 2024-02-29 ENCOUNTER — Telehealth: Payer: Self-pay

## 2024-02-29 MED ORDER — PREMARIN 0.625 MG/GM VA CREA
1.0000 g | TOPICAL_CREAM | VAGINAL | 1 refills | Status: AC
  Filled 2024-02-29: qty 60, 210d supply, fill #0

## 2024-02-29 NOTE — Telephone Encounter (Signed)
 Refilled: 04/07/2022 Last OV: 01/31/2024 Next OV: 02/02/2025  Spoke with pt and she stated that she is using the estradiol  on occasion.

## 2024-02-29 NOTE — Telephone Encounter (Signed)
 Yes it is

## 2024-02-29 NOTE — Addendum Note (Signed)
 Addended by: MARYLYNN VERNEITA CROME on: 02/29/2024 08:27 PM   Modules accepted: Orders

## 2024-03-01 ENCOUNTER — Other Ambulatory Visit: Payer: Self-pay

## 2024-03-02 ENCOUNTER — Other Ambulatory Visit: Payer: Self-pay

## 2024-03-06 NOTE — Telephone Encounter (Signed)
 noted

## 2024-03-13 ENCOUNTER — Encounter

## 2024-03-20 ENCOUNTER — Ambulatory Visit
Admission: RE | Admit: 2024-03-20 | Discharge: 2024-03-20 | Disposition: A | Source: Ambulatory Visit | Attending: Internal Medicine | Admitting: Internal Medicine

## 2024-03-20 DIAGNOSIS — Z1231 Encounter for screening mammogram for malignant neoplasm of breast: Secondary | ICD-10-CM

## 2025-01-31 ENCOUNTER — Other Ambulatory Visit

## 2025-02-02 ENCOUNTER — Encounter: Admitting: Internal Medicine
